# Patient Record
Sex: Female | Born: 1960
Health system: Southern US, Community
[De-identification: ages and names within clinical notes are randomized; demographics above are authoritative.]

## PROBLEM LIST (undated history)

## (undated) DIAGNOSIS — G43909 Migraine, unspecified, not intractable, without status migrainosus: Secondary | ICD-10-CM

## (undated) DIAGNOSIS — I10 Essential (primary) hypertension: Secondary | ICD-10-CM

## (undated) DIAGNOSIS — K219 Gastro-esophageal reflux disease without esophagitis: Secondary | ICD-10-CM

## (undated) DIAGNOSIS — E785 Hyperlipidemia, unspecified: Secondary | ICD-10-CM

## (undated) HISTORY — DX: Gastro-esophageal reflux disease without esophagitis: K21.9

## (undated) HISTORY — PX: TUBAL LIGATION: SHX77

## (undated) HISTORY — DX: Essential (primary) hypertension: I10

## (undated) HISTORY — DX: Migraine, unspecified, not intractable, without status migrainosus: G43.909

## (undated) HISTORY — PX: DILATION AND CURETTAGE OF UTERUS: SHX78

---

## 2004-10-15 ENCOUNTER — Ambulatory Visit: Payer: Self-pay | Admitting: General Practice

## 2005-11-19 ENCOUNTER — Ambulatory Visit: Payer: Self-pay | Admitting: General Practice

## 2006-04-05 ENCOUNTER — Other Ambulatory Visit: Payer: Self-pay

## 2006-04-08 ENCOUNTER — Ambulatory Visit: Payer: Self-pay | Admitting: Unknown Physician Specialty

## 2006-11-23 ENCOUNTER — Ambulatory Visit: Payer: Self-pay | Admitting: Endocrinology

## 2008-05-17 ENCOUNTER — Ambulatory Visit: Payer: Self-pay | Admitting: Unknown Physician Specialty

## 2009-11-04 LAB — HM MAMMOGRAPHY: HM Mammogram: NORMAL

## 2009-11-25 ENCOUNTER — Ambulatory Visit: Payer: Self-pay | Admitting: Unknown Physician Specialty

## 2010-04-06 LAB — HM MAMMOGRAPHY: HM Mammogram: NORMAL

## 2011-08-06 LAB — HM PAP SMEAR: HM Pap smear: NORMAL

## 2011-09-01 ENCOUNTER — Telehealth: Payer: Self-pay | Admitting: Internal Medicine

## 2011-09-01 NOTE — Telephone Encounter (Signed)
Called and advised patient that she needs to call [facility], or go to Urgent Care until she is established with [place].

## 2011-09-01 NOTE — Telephone Encounter (Signed)
Pt called to make new patient appointment made 11/03/11 pt stated she was dr [facility] pt he is no longer seeing pt.  She had episode yesterday  Of dizzy,clamming,could not think straight, nausea. Pt stated her neck and chest was real red yesterday after lunch.   Still dizzy this morning Had pt call back to [facility] to see if they could see her

## 2011-11-03 ENCOUNTER — Ambulatory Visit (INDEPENDENT_AMBULATORY_CARE_PROVIDER_SITE_OTHER): Payer: 59 | Admitting: Internal Medicine

## 2011-11-03 ENCOUNTER — Encounter: Payer: Self-pay | Admitting: Internal Medicine

## 2011-11-03 VITALS — BP 124/76 | HR 82 | Temp 97.9°F | Resp 14 | Ht 63.0 in | Wt 172.5 lb

## 2011-11-03 DIAGNOSIS — K219 Gastro-esophageal reflux disease without esophagitis: Secondary | ICD-10-CM

## 2011-11-03 DIAGNOSIS — E669 Obesity, unspecified: Secondary | ICD-10-CM

## 2011-11-03 DIAGNOSIS — I10 Essential (primary) hypertension: Secondary | ICD-10-CM

## 2011-11-03 DIAGNOSIS — Z1239 Encounter for other screening for malignant neoplasm of breast: Secondary | ICD-10-CM

## 2011-11-03 DIAGNOSIS — Z1211 Encounter for screening for malignant neoplasm of colon: Secondary | ICD-10-CM

## 2011-11-03 DIAGNOSIS — R079 Chest pain, unspecified: Secondary | ICD-10-CM

## 2011-11-03 DIAGNOSIS — Z124 Encounter for screening for malignant neoplasm of cervix: Secondary | ICD-10-CM

## 2011-11-03 MED ORDER — TRIAMTERENE-HCTZ 37.5-25 MG PO CAPS: 1.0000 | ORAL_CAPSULE | ORAL | Status: DC

## 2011-11-03 MED ORDER — OMEPRAZOLE 20 MG PO CPDR: 20.0000 mg | DELAYED_RELEASE_CAPSULE | Freq: Every day | ORAL | Status: DC

## 2011-11-03 MED ORDER — METOPROLOL SUCCINATE ER 50 MG PO TB24: 50.0000 mg | ORAL_TABLET | Freq: Every day | ORAL | Status: DC

## 2011-11-03 MED ORDER — SUMATRIPTAN SUCCINATE 25 MG PO TABS: 25.0000 mg | ORAL_TABLET | ORAL | Status: DC | PRN

## 2011-11-03 NOTE — Progress Notes (Signed)
Patient ID: Vanessa Richards, female   DOB: 08/25/1960, 51 y.o.   MRN: [id number]   Patient Active Problem List  Diagnosis  . GERD (gastroesophageal reflux disease)  . Hypertension  . Migraine syndrome  . Obesity (BMI 30-39.9)  . Chest pain at rest    Subjective:  CC:   Chief Complaint  Patient presents with  . New Patient    HPI:   Vanessa Richards is a 51 y.o. female who presents as a new patient to establish primary care referred by Arvilla Meres and by Dr. Wonda Cheng, her former PCP,  with the chief complaint of chest pain. She had a recent episode of chest pain in April that was evaluated with a treadmill stress test done at [facility] , ready by Gwen Pounds .  Reportedly normal, done wtithout a cardiologist evaluation pre or post procedure per patient.The chest pain occurred while she was are rest, talking on the phone and was described as chest heaviness, accompanied by disorientation, diaphoresis, and nausea.  She has episodes in the past which have been attributed to anxiety/panic disorder diagnosed after her first stress test years ago. There has been no recurrence of chest  pain during exercise.  FH of CAD: her GF died at 70.,  Father died at 17  With diffuse CAD , and DVT post op from CABG.   Continues to have episodes which she attributes to reflux since she has a history of esophageal reflux with prior stricture seen on a barium swallow. She has epidosed of esophgeal spasm caused by eating too fast.  No prior GI evaluation or EGD. She has additional chief complaints of wt gain of 15 lbs since October which she attributes to changing of her antihypertensives from a maxzide capsule, unclear dose, to tablets , at a presumedly lower dose, due to recurrent hypokalemia. , She exercises 3 times weekly with a walking program but has no dietary plan or focused restirction of calories or starches.  She has a history of diet induced hypoglycemia, caused by skipping breakfast  4) Menopause  occurred at age 7,  With hot flashes now improving tolerable without meds.    Past Medical History  Diagnosis Date  . GERD (gastroesophageal reflux disease)   . Hypertension   . Migraine syndrome     Past Surgical History  Procedure Date  . Dilation and curettage of uterus   . Tubal ligation     No family history on file.  History   Social History  . Marital Status: Married    Spouse Name: N/A    Number of Children: N/A  . Years of Education: N/A   Occupational History  . Not on file.   Social History Main Topics  . Smoking status: Never Smoker   . Smokeless tobacco: Never Used  . Alcohol Use: Yes  . Drug Use: No  . Sexually Active: Not on file   Other Topics Concern  . Not on file   Social History Narrative  . No narrative on file         No Known Allergies   Review of Systems:   The remainder of the review of systems was negative except those addressed in the HPI.    Objective:  BP 124/76  Pulse 82  Temp 97.9 F (36.6 C) (Oral)  Resp 14  Ht 5\' 3"  (1.6 m)  Wt 172 lb 8 oz (78.245 kg)  BMI 30.56 kg/m2  SpO2 96%  General appearance: alert, cooperative and appears stated  age Ears: normal TM's and external ear canals both ears Throat: lips, mucosa, and tongue normal; teeth and gums normal Neck: no adenopathy, no carotid bruit, supple, symmetrical, trachea midline and thyroid not enlarged, symmetric, no tenderness/mass/nodules Back: symmetric, no curvature. ROM normal. No CVA tenderness. Lungs: clear to auscultation bilaterally Heart: regular rate and rhythm, S1, S2 normal, no murmur, click, rub or gallop Abdomen: soft, non-tender; bowel sounds normal; no masses,  no organomegaly Pulses: 2+ and symmetric Skin: Skin color, texture, turgor normal. No rashes or lesions Lymph nodes: Cervical, supraclavicular, and axillary nodes normal.  Assessment and Plan:  Obesity (BMI 30-39.9) I have addressed  BMI and recommended a low glycemic index diet  utilizing smaller more frequent meals to increase metabolism.  I have also recommended that patient start exercising with a goal of 30 minutes of aerobic exercise a minimum of 5 days per week. Screening for lipid disorders, thyroid and diabetes to be done today.    GERD (gastroesophageal reflux disease) With history of esophageal stricture apparently seen on barium  UGI study with no EGD for follow up. I have searched [facility] for an UGI report and find no such report. Continue PPI daily and send for EGD evaluation to confirm stricture and rule out adenoCA with biopsy if found.   Chest pain at rest With prior risk stratification via treadmill stress test x 2 by [facility].   Symptoms are atypical , do not occur with exertion .  Will request prior records for evaluation . She has other diagnoses which could explain her symptoms.   Hypertension Resume prior dose of maxzide and supplement potassium if needed   Updated Medication List Outpatient Encounter Prescriptions as of 11/03/2011  Medication Sig Dispense Refill  . ALPRAZolam (XANAX) 0.5 MG tablet Take 0.5 mg by mouth at bedtime as needed.      . fexofenadine (ALLEGRA) 180 MG tablet Take 180 mg by mouth daily.      . metoprolol succinate (TOPROL-XL) 50 MG 24 hr tablet Take 1 tablet (50 mg total) by mouth daily. Take with or immediately following a meal.  30 tablet  5  . omeprazole (PRILOSEC) 20 MG capsule Take 1 capsule (20 mg total) by mouth daily.  30 capsule  5  . SUMAtriptan (IMITREX) 25 MG tablet Take 1 tablet (25 mg total) by mouth every 2 (two) hours as needed.  10 tablet  3  . DISCONTD: metoprolol succinate (TOPROL-XL) 50 MG 24 hr tablet Take 50 mg by mouth daily. Take with or immediately following a meal.      . DISCONTD: omeprazole (PRILOSEC) 20 MG capsule Take 20 mg by mouth daily.      Marland Kitchen DISCONTD: SUMAtriptan (IMITREX) 25 MG tablet Take 25 mg by mouth every 2 (two) hours as needed.      Marland Kitchen DISCONTD:  triamterene-hydrochlorothiazide (MAXZIDE-25) 37.5-25 MG per tablet Take 0.5 tablets by mouth daily.      Marland Kitchen triamterene-hydrochlorothiazide (DYAZIDE) 37.5-25 MG per capsule Take 1 each (1 capsule total) by mouth every morning.  30 capsule  3     Orders Placed This Encounter  Procedures  . MM Digital Screening    Return in about 3 months (around 02/03/2012).

## 2011-11-03 NOTE — Patient Instructions (Addendum)
Consider the Low Glycemic Index Diet and 6 smaller meals daily .  This boosts your metabolism and regulates your sugars:   7 AM Low carbohydrate Protein  Shakes (EAS Carb Control  Or Atkins ,  Available everywhere,   In  cases at BJs )  2.5 carbs  (Add or substitute a toasted sandwhich thin w/ peanut butter)  10 AM: Protein bar by Atkins (snack size,  Chocolate lover's variety at  BJ's)    Lunch: sandwich on pita bread or flatbread (Joseph's makes a pita bread and a flat bread , available at [employer] and [employer]J's; Toufayan makes a low carb flatbread available at [employer] and [employer]) Mission makes a low carb whole wheat tortilla available at [employer] most grocery stores   3 PM:  Mid day :  Another protein bar,  Or a  cheese stick, 1/4 cup of almonds, walnuts, pistachios, pecans, peanuts,  Macadamia nuts  6 PM  Dinner:  "mean and green:"  Meat/chicken/fish, salad, and green veggie : use ranch, vinagrette,  Blue cheese, etc  9 PM snack : Breyer's low carb fudgsicle or  ice cream bar (Carb Smart), or  Weight Watcher's ice cream bar , or another protein shake Dannon Light  n fit greek yogurt is 80 cal and 8 carbs     Try to keep substitutions under  20 net carbs (subtract fiber carbs form total carb to get net carbs)

## 2011-11-05 ENCOUNTER — Encounter: Payer: Self-pay | Admitting: Internal Medicine

## 2011-11-05 DIAGNOSIS — K219 Gastro-esophageal reflux disease without esophagitis: Secondary | ICD-10-CM | POA: Insufficient documentation

## 2011-11-05 DIAGNOSIS — R079 Chest pain, unspecified: Secondary | ICD-10-CM | POA: Insufficient documentation

## 2011-11-05 DIAGNOSIS — E669 Obesity, unspecified: Secondary | ICD-10-CM | POA: Insufficient documentation

## 2011-11-05 DIAGNOSIS — G43909 Migraine, unspecified, not intractable, without status migrainosus: Secondary | ICD-10-CM | POA: Insufficient documentation

## 2011-11-05 DIAGNOSIS — I10 Essential (primary) hypertension: Secondary | ICD-10-CM | POA: Insufficient documentation

## 2011-11-05 NOTE — Assessment & Plan Note (Signed)
With prior risk stratification via treadmill stress test x 2.  Symptoms are atypical , do not occur with exertion .  Will request prior records for evaluation . She has other diagnoses which could explain her symptoms.

## 2011-11-05 NOTE — Assessment & Plan Note (Signed)
I have addressed  BMI and recommended a low glycemic index diet utilizing smaller more frequent meals to increase metabolism.  I have also recommended that patient start exercising with a goal of 30 minutes of aerobic exercise a minimum of 5 days per week. Screening for lipid disorders, thyroid and diabetes to be done today.   

## 2011-11-05 NOTE — Assessment & Plan Note (Signed)
Resume prior dose of maxzide and supplement potassium if needed

## 2011-11-05 NOTE — Assessment & Plan Note (Signed)
With history of esophageal stricture apparently seen on barium  UGI study with no EGD for follow up. Continue PPI daily and send for EGD evaluation to confirm stricture and rule out adenoCA with biopsy if found.

## 2011-11-19 ENCOUNTER — Ambulatory Visit: Payer: Self-pay | Admitting: Internal Medicine

## 2011-11-19 LAB — HM MAMMOGRAPHY: HM Mammogram: NORMAL

## 2011-11-23 ENCOUNTER — Telehealth: Payer: Self-pay | Admitting: Internal Medicine

## 2011-11-23 NOTE — Telephone Encounter (Signed)
Left message notifying patient.

## 2011-11-23 NOTE — Telephone Encounter (Signed)
Her mammogram was normal.  Repeat in one year 

## 2011-11-30 ENCOUNTER — Encounter: Payer: Self-pay | Admitting: Internal Medicine

## 2012-01-08 ENCOUNTER — Ambulatory Visit (INDEPENDENT_AMBULATORY_CARE_PROVIDER_SITE_OTHER): Payer: 59 | Admitting: Internal Medicine

## 2012-01-08 ENCOUNTER — Encounter: Payer: Self-pay | Admitting: Internal Medicine

## 2012-01-08 VITALS — BP 142/88 | HR 87 | Temp 98.8°F | Ht 63.0 in | Wt 166.0 lb

## 2012-01-08 DIAGNOSIS — J40 Bronchitis, not specified as acute or chronic: Secondary | ICD-10-CM

## 2012-01-08 MED ORDER — PREDNISONE (PAK) 10 MG PO TABS: ORAL_TABLET | ORAL | Status: AC

## 2012-01-08 MED ORDER — HYDROCOD POLST-CHLORPHEN POLST 10-8 MG/5ML PO LQCR: 5.0000 mL | Freq: Two times a day (BID) | ORAL | Status: DC | PRN

## 2012-01-08 MED ORDER — AMOXICILLIN-POT CLAVULANATE 875-125 MG PO TABS: 1.0000 | ORAL_TABLET | Freq: Two times a day (BID) | ORAL | Status: AC

## 2012-01-08 NOTE — Assessment & Plan Note (Signed)
Symptoms are most consistent with bronchitis. Will treat with Augmentin and prednisone taper. We'll use Tussionex for cough. Patient will call or e-mail if symptoms are not improving over the next 72 hours. If no improvement, would favor getting chest x-ray.

## 2012-01-08 NOTE — Progress Notes (Signed)
Subjective:    Patient ID: Vanessa Richards, female    DOB: 20-Jul-1960, 51 y.o.   MRN: [id number]  HPI 51 year old female with history of hypertension presents for acute visit complaining of one-week history of sore throat, cough, and shortness of breath. She reports that her son was sick with similar symptoms. Her symptoms first began with general malaise, subjective fever and sore throat. She then developed a nonproductive cough and occasional tightness across her chest and shortness of breath. She took some over-the-counter cough and cold medication with minimal improvement. She denies any persistent fever or chills or chest pain. She is not a smoker.  Outpatient Encounter Prescriptions as of 01/08/2012  Medication Sig Dispense Refill  . ALPRAZolam (XANAX) 0.5 MG tablet Take 0.5 mg by mouth at bedtime as needed.      . fexofenadine (ALLEGRA) 180 MG tablet Take 180 mg by mouth daily.      . metoprolol succinate (TOPROL-XL) 50 MG 24 hr tablet Take 1 tablet (50 mg total) by mouth daily. Take with or immediately following a meal.  30 tablet  5  . omeprazole (PRILOSEC) 20 MG capsule Take 1 capsule (20 mg total) by mouth daily.  30 capsule  5  . SUMAtriptan (IMITREX) 25 MG tablet Take 1 tablet (25 mg total) by mouth every 2 (two) hours as needed.  10 tablet  3  . triamterene-hydrochlorothiazide (DYAZIDE) 37.5-25 MG per capsule Take 1 each (1 capsule total) by mouth every morning.  30 capsule  3  . amoxicillin-clavulanate (AUGMENTIN) 875-125 MG per tablet Take 1 tablet by mouth 2 (two) times daily.  20 tablet  0  . chlorpheniramine-HYDROcodone (TUSSIONEX) 10-8 MG/5ML LQCR Take 5 mLs by mouth every 12 (twelve) hours as needed.  140 mL  0  . predniSONE (STERAPRED UNI-PAK) 10 MG tablet Take 60mg  day 1 then taper by 10mg  daily  21 tablet  0    Review of Systems  Constitutional: Positive for fever and fatigue. Negative for chills, appetite change and unexpected weight change.  HENT: Positive for sore  throat. Negative for ear pain, congestion, trouble swallowing, neck pain, voice change and sinus pressure.   Eyes: Negative for visual disturbance.  Respiratory: Positive for cough, chest tightness and shortness of breath. Negative for wheezing and stridor.   Cardiovascular: Negative for chest pain, palpitations and leg swelling.  Gastrointestinal: Negative for nausea, vomiting, abdominal pain, diarrhea, constipation, blood in stool, abdominal distention and anal bleeding.  Genitourinary: Negative for dysuria and flank pain.  Musculoskeletal: Negative for myalgias, arthralgias and gait problem.  Skin: Negative for color change and rash.  Neurological: Negative for dizziness and headaches.  Hematological: Negative for adenopathy. Does not bruise/bleed easily.  Psychiatric/Behavioral: Negative for suicidal ideas, disturbed wake/sleep cycle and dysphoric mood. The patient is not nervous/anxious.    BP 142/88  Pulse 87  Temp 98.8 F (37.1 C) (Oral)  Ht 5\' 3"  (1.6 m)  Wt 166 lb (75.297 kg)  BMI 29.41 kg/m2  SpO2 98%     Objective:   Physical Exam  Constitutional: She is oriented to person, place, and time. She appears well-developed and well-nourished. No distress.  HENT:  Head: Normocephalic and atraumatic.  Right Ear: External ear normal.  Left Ear: External ear normal.  Nose: Nose normal.  Mouth/Throat: Oropharynx is clear and moist. No oropharyngeal exudate.  Eyes: Conjunctivae are normal. Pupils are equal, round, and reactive to light. Right eye exhibits no discharge. Left eye exhibits no discharge. No scleral icterus.  Neck: Normal  range of motion. Neck supple. No tracheal deviation present. No thyromegaly present.  Cardiovascular: Normal rate, regular rhythm, normal heart sounds and intact distal pulses.  Exam reveals no gallop and no friction rub.   No murmur heard. Pulmonary/Chest: Breath sounds normal. No accessory muscle usage. Not tachypneic. No respiratory distress. She has  no decreased breath sounds. She has no wheezes. She has no rhonchi. She has no rales. She exhibits no tenderness.  Musculoskeletal: Normal range of motion. She exhibits no edema and no tenderness.  Lymphadenopathy:    She has no cervical adenopathy.  Neurological: She is alert and oriented to person, place, and time. No cranial nerve deficit. She exhibits normal muscle tone. Coordination normal.  Skin: Skin is warm and dry. No rash noted. She is not diaphoretic. No erythema. No pallor.  Psychiatric: She has a normal mood and affect. Her behavior is normal. Judgment and thought content normal.          Assessment & Plan:

## 2012-02-03 ENCOUNTER — Encounter (INDEPENDENT_AMBULATORY_CARE_PROVIDER_SITE_OTHER): Payer: 59 | Admitting: Internal Medicine

## 2012-02-03 DIAGNOSIS — Z0289 Encounter for other administrative examinations: Secondary | ICD-10-CM

## 2012-02-05 NOTE — Progress Notes (Signed)
Patient ID: Vanessa Richards, female   DOB: [birth year], 51 y.o.   MRN: [id number] patient no showed

## 2012-04-13 ENCOUNTER — Ambulatory Visit: Payer: 59 | Admitting: Internal Medicine

## 2012-04-15 ENCOUNTER — Ambulatory Visit: Payer: 59 | Admitting: Internal Medicine

## 2012-04-18 ENCOUNTER — Ambulatory Visit (INDEPENDENT_AMBULATORY_CARE_PROVIDER_SITE_OTHER): Payer: 59 | Admitting: Internal Medicine

## 2012-04-18 ENCOUNTER — Encounter: Payer: Self-pay | Admitting: Internal Medicine

## 2012-04-18 VITALS — BP 110/62 | HR 72 | Temp 98.2°F | Resp 12 | Ht 63.0 in | Wt 162.0 lb

## 2012-04-18 DIAGNOSIS — I10 Essential (primary) hypertension: Secondary | ICD-10-CM

## 2012-04-18 DIAGNOSIS — E669 Obesity, unspecified: Secondary | ICD-10-CM

## 2012-04-18 DIAGNOSIS — R079 Chest pain, unspecified: Secondary | ICD-10-CM

## 2012-04-18 DIAGNOSIS — K219 Gastro-esophageal reflux disease without esophagitis: Secondary | ICD-10-CM

## 2012-04-18 DIAGNOSIS — Z79899 Other long term (current) drug therapy: Secondary | ICD-10-CM

## 2012-04-18 LAB — COMPREHENSIVE METABOLIC PANEL
ALT: 15 U/L (ref 0–35)
AST: 21 U/L (ref 0–37)
Albumin: 4.5 g/dL (ref 3.5–5.2)
Alkaline Phosphatase: 87 U/L (ref 39–117)
BUN: 15 mg/dL (ref 6–23)
CO2: 31 mEq/L (ref 19–32)
Calcium: 9.6 mg/dL (ref 8.4–10.5)
Chloride: 99 mEq/L (ref 96–112)
Creatinine, Ser: 0.9 mg/dL (ref 0.4–1.2)
GFR: 74.9 mL/min (ref >60.00)
Glucose, Bld: 94 mg/dL (ref 70–99)
Potassium: 3.9 mEq/L (ref 3.5–5.1)
Sodium: 138 mEq/L (ref 135–145)
Total Bilirubin: 0.8 mg/dL (ref 0.3–1.2)
Total Protein: 8.4 g/dL — ABNORMAL HIGH (ref 6.0–8.3)

## 2012-04-18 MED ORDER — ALPRAZOLAM 0.5 MG PO TABS: 0.5000 mg | ORAL_TABLET | Freq: Every evening | ORAL | Status: DC | PRN

## 2012-04-18 NOTE — Progress Notes (Signed)
Patient ID: Vanessa Richards, female   DOB: 1960-10-09, 51 y.o.   MRN: [id number]  Patient Active Problem List  Diagnosis  . GERD (gastroesophageal reflux disease)  . Hypertension  . Migraine syndrome  . Obesity (BMI 30-39.9)  . Chest pain at rest  . Bronchitis    Subjective:  CC:   Chief Complaint  Patient presents with  . Follow-up    HPI:   Vanessa Richards a 51 y.o. female who presents Follow up 6 months on diet induced hypoglycemia,  obesity, and GERD.  She is  frustrated at lack of significant weight loss.  She is not exercising and is working  an additional part time job.    Past Medical History  Diagnosis Date  . GERD (gastroesophageal reflux disease)   . Hypertension   . Migraine syndrome     Past Surgical History  Procedure Date  . Dilation and curettage of uterus   . Tubal ligation     The following portions of the patient's history were reviewed and updated as appropriate: Allergies, current medications, and problem list.    Review of Systems:    Patient denies headache, fevers, malaise, unintentional weight loss, skin rash, eye pain, sinus congestion and sinus pain, sore throat, dysphagia,  hemoptysis , cough, dyspnea, wheezing, chest pain, palpitations, orthopnea, edema, abdominal pain, nausea, melena, diarrhea, constipation, flank pain, dysuria, hematuria, urinary  Frequency, nocturia, numbness, tingling, seizures,  Focal weakness, Loss of consciousness,  Tremor, insomnia, depression, anxiety, and suicidal ideation.    History   Social History  . Marital Status: Married    Spouse Name: N/A    Number of Children: N/A  . Years of Education: N/A   Occupational History  . Not on file.   Social History Main Topics  . Smoking status: Never Smoker   . Smokeless tobacco: Never Used  . Alcohol Use: Yes  . Drug Use: No  . Sexually Active: Not on file   Other Topics Concern  . Not on file   Social History Narrative  . No narrative on file     Objective:  BP 110/62  Pulse 72  Temp 98.2 F (36.8 C) (Oral)  Resp 12  Ht 5\' 3"  (1.6 m)  Wt 162 lb (73.483 kg)  BMI 28.70 kg/m2  SpO2 98%  General appearance: alert, cooperative and appears stated age Ears: normal TM's and external ear canals both ears Throat: lips, mucosa, and tongue normal; teeth and gums normal Neck: no adenopathy, no carotid bruit, supple, symmetrical, trachea midline and thyroid not enlarged, symmetric, no tenderness/mass/nodules Back: symmetric, no curvature. ROM normal. No CVA tenderness. Lungs: clear to auscultation bilaterally Heart: regular rate and rhythm, S1, S2 normal, no murmur, click, rub or gallop Abdomen: soft, non-tender; bowel sounds normal; no masses,  no organomegaly Pulses: 2+ and symmetric Skin: Skin color, texture, turgor normal. No rashes or lesions Lymph nodes: Cervical, supraclavicular, and axillary nodes normal.  Assessment and Plan:  GERD (gastroesophageal reflux disease) Improved with current therapy.  Was referred to GI but did not keep appt for EGD   Chest pain at rest resolved with treatment of GERD  Obesity (BMI 30-39.9) Now improved,  with BMI 28 currently. Her BMI remains elevated because she is not exercised.  We discussed the nature and quality of her exercises well as of her diet. Usually what I find is that people are not exercising as vigorously as they should to achieve a sustained heart rate in the aerobic zone. I suggested that  she consider joining a gym and using a personal trainer to help guide her efforts.   I also am advising her to get back on the low GI diet using six smaller meals a day to stimulate her metabolism.    Hypertension Well controlled on current medications.  No changes today.   Updated Medication List Outpatient Encounter Prescriptions as of 04/18/2012  Medication Sig Dispense Refill  . ALPRAZolam (XANAX) 0.5 MG tablet Take 1 tablet (0.5 mg total) by mouth at bedtime as needed.  30  tablet  1  . fexofenadine (ALLEGRA) 180 MG tablet Take 180 mg by mouth daily.      . metoprolol succinate (TOPROL-XL) 50 MG 24 hr tablet Take 1 tablet (50 mg total) by mouth daily. Take with or immediately following a meal.  30 tablet  5  . omeprazole (PRILOSEC) 20 MG capsule Take 1 capsule (20 mg total) by mouth daily.  30 capsule  5  . SUMAtriptan (IMITREX) 25 MG tablet Take 1 tablet (25 mg total) by mouth every 2 (two) hours as needed.  10 tablet  3  . triamterene-hydrochlorothiazide (DYAZIDE) 37.5-25 MG per capsule Take 1 each (1 capsule total) by mouth every morning.  30 capsule  3  . [DISCONTINUED] ALPRAZolam (XANAX) 0.5 MG tablet Take 0.5 mg by mouth at bedtime as needed.      . chlorpheniramine-HYDROcodone (TUSSIONEX) 10-8 MG/5ML LQCR Take 5 mLs by mouth every 12 (twelve) hours as needed.  140 mL  0     Orders Placed This Encounter  Procedures  . [facility] MAMMOGRAPHY  . Comprehensive metabolic panel    No Follow-up on file.

## 2012-04-18 NOTE — Assessment & Plan Note (Signed)
resolved with treatment of GERD

## 2012-04-18 NOTE — Assessment & Plan Note (Signed)
Well controlled on current medications.  No changes today. 

## 2012-04-18 NOTE — Patient Instructions (Addendum)
This is  my version of a  "Low GI"  Diet:  All of the foods can be found at grocery stores and in bulk at [employer].  The Atkins protein bars and shakes are available in more varieties at [employer], [employer] and [employer].     7 AM Breakfast:  Low carbohydrate Protein  Shakes (I recommend the EAS AdvantEdge "Carb Control" shakes  Or the low carb shakes by Atkins.   Both are available everywhere:  In  cases at BJs  Or in 4 packs at grocery stores and pharmacies  2.5 carbs  (Alternative is  a toasted Arnold's Sandwhich Thin w/ peanut butter, a "Bagel Thin" with cream cheese and salmon) or  a scrambled egg burrito made with a low carb tortilla .  Avoid cereal and bananas, oatmeal too unless you are cooking the old fashioned kind that takes 30-40 minutes to prepare.  the rest is overly processed, has minimal fiber, and is loaded with carbohydrates!   10 AM: Protein bar by Atkins (the snack size, under 200 cal).  There are many varieties , available widely again or in bulk in limited varieties at BJs)  Other so called "protein bars" tend to be loaded with carbohydrates.  Remember, in food advertising, the word "energy" is synonymous for " carbohydrate."  Lunch: sandwich of turkey, (or any lunchmeat, grilled meat or canned tuna), fresh avocado, mayonnaise  and cheese on a lower carbohydrate pita bread, flatbread, or tortilla . Ok to use regular mayonnaise. The bread is the only source or carbohydrate that can be decreased (Joseph's makes a pita bread and a flat bread that are 50 cal and 4 net carbs ; Toufayan makes a low carb flatbread that's 100 cal and 9 net carbs  and  Mission makes a low carb whole wheat tortilla  That is 210 cal and 6 net carbs)  3 PM:  Mid day :  Another protein bar,  Or a  cheese stick (100 cal, 0 carbs),  Or 1 ounce of  almonds, walnuts, pistachios, pecans, peanuts,  Macadamia nuts. Or a Dannon light n Fit greek yogurt, 80 cal 8 net carbs . Avoid "granola"; the dried cranberries and  raisins are loaded with carbohydrates. Mixed nuts ok if no raisins or cranberries or dried fruit.      6 PM  Dinner:  "mean and green:"  Meat/chicken/fish or a high protein legume; , with a green salad, and a low GI  Veggie (broccoli, cauliflower, green beans, spinach, brussel sprouts. Lima beans) : Avoid "Low fat dressings, as well as [place] and [place]! They are loaded with sugar! Instead use ranch, vinagrette,  Blue cheese, etc.  There is a low carb pasta by Dreamfield's available at [employer] grocery that is acceptable and tastes great. Try Michel Angel's chicken piccata over low carb pasta. The chicken dish is 0 carbs, and can be found in frozen section at BJs and [employer]'s. Also try Aaron Sanchez's "Carnitas" (pulled pork, no sauce,  0 carbs) and his pot roast.   both are in the refrigerated section at BJs   9 PM snack : Breyer's "low carb" fudgsicle or  ice cream bar (Carb Smart line), or  Weight Watcher's ice cream bar , or another "no sugar added" ice cream;a serving of fresh berries/cherries with whipped cream (Avoid bananas, pineapple, grapes  and watermelon on a regular basis because they are high in sugar)   Remember that snack Substitutions should be less than 15 to   20 carbs  Per serving. Remember to subtract fiber grams and sugar alcohols to get the "net carbs."   

## 2012-04-18 NOTE — Assessment & Plan Note (Signed)
Improved with current therapy.  Was referred to GI but did not keep appt for EGD

## 2012-04-18 NOTE — Assessment & Plan Note (Signed)
Now improved,  with BMI 28 currently. Her BMI remains elevated because she is not exercised.  We discussed the nature and quality of her exercises well as of her diet. Usually what I find is that people are not exercising as vigorously as they should to achieve a sustained heart rate in the aerobic zone. I suggested that she consider joining a gym and using a personal trainer to help guide her efforts.   I also am advising her to get back on the low GI diet using six smaller meals a day to stimulate her metabolism.

## 2012-05-17 ENCOUNTER — Other Ambulatory Visit: Payer: Self-pay | Admitting: Internal Medicine

## 2012-05-17 MED ORDER — TRIAMTERENE-HCTZ 37.5-25 MG PO CAPS: 1.0000 | ORAL_CAPSULE | ORAL | Status: DC

## 2012-05-17 NOTE — Telephone Encounter (Signed)
triamterene-hydrochlorothiazide (DYAZIDE) 37.5-25 MG per capsule  #30

## 2012-05-17 NOTE — Telephone Encounter (Signed)
Med filled.  

## 2012-05-18 ENCOUNTER — Other Ambulatory Visit: Payer: Self-pay | Admitting: General Practice

## 2012-05-18 MED ORDER — TRIAMTERENE-HCTZ 37.5-25 MG PO CAPS: 1.0000 | ORAL_CAPSULE | ORAL | Status: DC

## 2012-05-18 MED ORDER — METOPROLOL SUCCINATE ER 50 MG PO TB24: 50.0000 mg | ORAL_TABLET | Freq: Every day | ORAL | Status: DC

## 2012-05-18 NOTE — Telephone Encounter (Signed)
Med filled per pt. Sent to wrong pharmacy.

## 2012-10-05 LAB — HM PAP SMEAR: HM Pap smear: NORMAL

## 2012-10-18 ENCOUNTER — Encounter: Payer: Self-pay | Admitting: Internal Medicine

## 2012-10-18 ENCOUNTER — Other Ambulatory Visit (HOSPITAL_COMMUNITY)
Admission: RE | Admit: 2012-10-18 | Discharge: 2012-10-18 | Disposition: A | Discharge status: Home / Self Care | Payer: BC Managed Care – PPO | Source: Ambulatory Visit | Attending: Internal Medicine | Admitting: Internal Medicine

## 2012-10-18 ENCOUNTER — Ambulatory Visit (INDEPENDENT_AMBULATORY_CARE_PROVIDER_SITE_OTHER): Payer: BC Managed Care – PPO | Admitting: Internal Medicine

## 2012-10-18 VITALS — BP 118/72 | HR 80 | Temp 98.2°F | Resp 16 | Ht 63.0 in | Wt 171.8 lb

## 2012-10-18 DIAGNOSIS — Z124 Encounter for screening for malignant neoplasm of cervix: Secondary | ICD-10-CM

## 2012-10-18 DIAGNOSIS — Z79899 Other long term (current) drug therapy: Secondary | ICD-10-CM

## 2012-10-18 DIAGNOSIS — Z1211 Encounter for screening for malignant neoplasm of colon: Secondary | ICD-10-CM

## 2012-10-18 DIAGNOSIS — Z1239 Encounter for other screening for malignant neoplasm of breast: Secondary | ICD-10-CM

## 2012-10-18 DIAGNOSIS — Z01419 Encounter for gynecological examination (general) (routine) without abnormal findings: Secondary | ICD-10-CM | POA: Insufficient documentation

## 2012-10-18 DIAGNOSIS — Z1151 Encounter for screening for human papillomavirus (HPV): Secondary | ICD-10-CM | POA: Insufficient documentation

## 2012-10-18 DIAGNOSIS — N181 Chronic kidney disease, stage 1: Secondary | ICD-10-CM

## 2012-10-18 DIAGNOSIS — J309 Allergic rhinitis, unspecified: Secondary | ICD-10-CM

## 2012-10-18 DIAGNOSIS — N183 Chronic kidney disease, stage 3 unspecified: Secondary | ICD-10-CM

## 2012-10-18 DIAGNOSIS — Z0001 Encounter for general adult medical examination with abnormal findings: Secondary | ICD-10-CM | POA: Insufficient documentation

## 2012-10-18 DIAGNOSIS — Z0000 Encounter for general adult medical examination without abnormal findings: Secondary | ICD-10-CM

## 2012-10-18 MED ORDER — FLUTICASONE PROPIONATE HFA 44 MCG/ACT IN AERO: 2.0000 | INHALATION_SPRAY | Freq: Two times a day (BID) | RESPIRATORY_TRACT | Status: DC

## 2012-10-18 NOTE — Progress Notes (Signed)
Patient ID: Vanessa Richards, female   DOB: February 24, 1961, 52 y.o.   MRN: [id number]   Subjective:     Vanessa Richards is a 52 y.o. female and is here for a comprehensive physical exam. The patient reports no problems.  History   Social History  . Marital Status: Married    Spouse Name: N/A    Number of Children: N/A  . Years of Education: N/A   Occupational History  . Not on file.   Social History Main Topics  . Smoking status: Never Smoker   . Smokeless tobacco: Never Used  . Alcohol Use: Yes  . Drug Use: No  . Sexually Active: Not on file   Other Topics Concern  . Not on file   Social History Narrative  . No narrative on file   Health Maintenance  Topic Date Due  . Colonoscopy  02/21/2011  . Influenza Vaccine  01/02/2013  . Mammogram  11/18/2013  . Pap Smear  10/19/2015  . Tetanus/tdap  04/18/2016    The following portions of the patient's history were reviewed and updated as appropriate: allergies, current medications, past family history, past medical history, past social history, past surgical history and problem list.  Review of Systems A comprehensive review of systems was negative.   Objective:   BP 118/72  Pulse 80  Temp(Src) 98.2 F (36.8 C) (Oral)  Resp 16  Ht 5\' 3"  (1.6 m)  Wt 171 lb 12 oz (77.905 kg)  BMI 30.43 kg/m2  SpO2 98%  General Appearance:    Alert, cooperative, no distress, appears stated age  Head:    Normocephalic, without obvious abnormality, atraumatic  Eyes:    PERRL, conjunctiva/corneas clear, EOM's intact, fundi    benign, both eyes  Ears:    Normal TM's and external ear canals, both ears  Nose:   Nares normal, septum midline, mucosa normal, no drainage    or sinus tenderness  Throat:   Lips, mucosa, and tongue normal; teeth and gums normal  Neck:   Supple, symmetrical, trachea midline, no adenopathy;    thyroid:  no enlargement/tenderness/nodules; no carotid   bruit or JVD  Back:     Symmetric, no curvature, ROM normal, no  CVA tenderness  Lungs:     Clear to auscultation bilaterally, respirations unlabored  Chest Wall:    No tenderness or deformity   Heart:    Regular rate and rhythm, S1 and S2 normal, no murmur, rub   or gallop  Breast Exam:    No tenderness, masses, or nipple abnormality  Abdomen:     Soft, non-tender, bowel sounds active all four quadrants,    no masses, no organomegaly  Genitalia:    Pelvic: cervix normal in appearance, external genitalia normal, no adnexal masses or tenderness, no cervical motion tenderness, rectovaginal septum normal, uterus normal size, shape, and consistency and vagina normal without discharge  Extremities:   Extremities normal, atraumatic, no cyanosis or edema  Pulses:   2+ and symmetric all extremities  Skin:   Skin color, texture, turgor normal, no rashes or lesions  Lymph nodes:   Cervical, supraclavicular, and axillary nodes normal  Neurologic:   CNII-XII intact, normal strength, sensation and reflexes    throughout   Assessment and Plan  Routine general medical examination at a health care facility Annual comprehensive exam was done including breast, pelvic and PAP smear. All screenings have been addressed .  She has just changed insurance companies so colon cancer screening modalities were discussed and  she chose to do annual FOBTs until she hs  Met  her deductible   Updated Medication List Outpatient Encounter Prescriptions as of 10/18/2012  Medication Sig Dispense Refill  . ALPRAZolam (XANAX) 0.5 MG tablet Take 1 tablet (0.5 mg total) by mouth at bedtime as needed.  30 tablet  1  . fexofenadine (ALLEGRA) 180 MG tablet Take 180 mg by mouth daily.      . metoprolol succinate (TOPROL-XL) 50 MG 24 hr tablet Take 1 tablet (50 mg total) by mouth daily. Take with or immediately following a meal.  30 tablet  5  . omeprazole (PRILOSEC) 20 MG capsule Take 1 capsule (20 mg total) by mouth daily.  30 capsule  5  . SUMAtriptan (IMITREX) 25 MG tablet Take 1 tablet (25 mg  total) by mouth every 2 (two) hours as needed.  10 tablet  3  . triamterene-hydrochlorothiazide (DYAZIDE) 37.5-25 MG per capsule Take 1 each (1 capsule total) by mouth every morning.  30 capsule  3  . chlorpheniramine-HYDROcodone (TUSSIONEX) 10-8 MG/5ML LQCR Take 5 mLs by mouth every 12 (twelve) hours as needed.  140 mL  0  . fluticasone (FLOVENT HFA) 44 MCG/ACT inhaler Inhale 2 puffs into the lungs 2 (two) times daily.  1 Inhaler  2   No facility-administered encounter medications on file as of 10/18/2012.

## 2012-10-18 NOTE — Patient Instructions (Addendum)
You had your annual exam today with PAP smear.  Mammogram is due after July 18th  Labs done today,  Return thr fecal occult blood test for your colon cancer screening test (done annually in place of colonoscopy)  This is Dr. Melina Schools personal version of a  "Low GI"  Diet:  It will allow you to lose 4 to 8  lbs  per month if you follow it carefully.  Your goal with exercise is a minimum of 30 minutes of aerobic exercise 5 days per week (Walking does not count once it becomes easy!)    All of the foods can be found at grocery stores and in bulk at [employer].  The Atkins protein bars and shakes are available in more varieties at Target, WalMart and [employer].     7 AM Breakfast:  Choose from the following:  Low carbohydrate Protein  Shakes (I recommend the [employer]AS AdvantEdge "Carb Control" shakes  Or the low carb shakes by Atkins.    2.5 carbs   Arnold's "Sandwhich Thin"toasted  w/ peanut butter (no jelly: about 20 net carbs  "Bagel Thin" with cream cheese and salmon: about 20 carbs   a scrambled egg/bacon/cheese burrito made with Mission's "carb balance" whole wheat tortilla  (about 10 net carbs )   Avoid cereal and bananas, oatmeal and cream of wheat and grits. They are loaded with carbohydrates!   10 AM: high protein snack  Protein bar by Atkins (the snack size, under 200 cal, usually < 6 net carbs).    A stick of cheese:  Around 1 carb,  100 cal     Dannon Light n Fit [place] Yogurt  (80 cal, 8 carbs)  Other so called "protein bars" and Greek yogurts tend to be loaded with carbohydrates.  Remember, in food advertising, the word "energy" is synonymous for " carbohydrate."  Lunch:   A Sandwich using the bread choices listed, Can use any  Eggs,  lunchmeat, grilled meat or canned tuna), avocado, regular mayo/mustard  and cheese.  A Salad using blue cheese, ranch,  Goddess or vinagrette,  No croutons or "confetti" and no "candied nuts" but regular nuts OK.   No pretzels or chips.  Pickles and  miniature sweet peppers are a good low carb alternative that provide a "crunch"  The bread is the only source of carbohydrate in a sandwich and  can be decreased by trying some of these alternatives to traditional loaf bread  Joseph's makes a pita bread and a flat bread that are 50 cal and 4 net carbs available at BJs and WalMart.  This can be toasted to use with hummous as well  Toufayan makes a low carb flatbread that's 100 cal and 9 net carbs available at [employer] and [employer] makes 2 sizes of  Low carb whole wheat tortilla  (The large one is 210 cal and 6 net carbs) Avoid "Low fat dressings, as well as Reyne Dumas and [address] dressings They are loaded with sugar!   3 PM/ Mid day  Snack:  Consider  1 ounce of  almonds, walnuts, pistachios, pecans, peanuts,  Macadamia nuts or a nut medley.  Avoid "granola"; the dried cranberries and raisins are loaded with carbohydrates. Mixed nuts as long as there are no raisins,  cranberries or dried fruit.     6 PM  Dinner:     Meat/fowl/fish with a green salad, and either broccoli, cauliflower, green beans, spinach, brussel sprouts or  Lima beans.  DO NOT BREAD THE PROTEIN!!      There is a low carb pasta by Dreamfield's that is acceptable and tastes great: only 5 digestible carbs/serving.( All grocery stores but BJs carry it )  Try Kai Levins Angelo's chicken piccata or chicken or eggplant parm over low carb pasta.(Lowes and BJs)   Clifton Custard Sanchez's "Carnitas" (pulled pork, no sauce,  0 carbs) or his beef pot roast to make a dinner burrito (at BJ's)  Pesto over low carb pasta (bj's sells a good quality pesto in the center refrigerated section of the deli   Whole wheat pasta is still full of digestible carbs and  Not as low in glycemic index as Dreamfield's.   Brown rice is still rice,  So skip the rice and noodles if you eat [place] or [place] (or at least limit to 1/2 cup)  9 PM snack :   Breyer's "low carb" fudgsicle or  ice cream bar (Carb Smart  line), or  Weight Watcher's ice cream bar , or another "no sugar added" ice cream;  a serving of fresh berries/cherries with whipped cream   Cheese or DANNON'S LlG[employer] [place] YOGURT  Avoid bananas, pineapple, grapes  and watermelon on a regular basis because they are high in sugar.  THINK OF THEM AS DESSERT  Remember that snack Substitutions should be less than 10 NET carbs per serving and meals < 20 carbs. Remember to subtract fiber grams to get the "net carbs."

## 2012-10-19 LAB — COMPREHENSIVE METABOLIC PANEL
ALT: 17 U/L (ref 0–35)
AST: 22 U/L (ref 0–37)
Albumin: 4 g/dL (ref 3.5–5.2)
Alkaline Phosphatase: 87 U/L (ref 39–117)
BUN: 16 mg/dL (ref 6–23)
CO2: 22 mEq/L (ref 19–32)
Calcium: 8.8 mg/dL (ref 8.4–10.5)
Chloride: 102 mEq/L (ref 96–112)
Creatinine, Ser: 1.2 mg/dL (ref 0.4–1.2)
GFR: 52.74 mL/min — ABNORMAL LOW (ref >60.00)
Glucose, Bld: 89 mg/dL (ref 70–99)
Potassium: 3.6 mEq/L (ref 3.5–5.1)
Sodium: 142 mEq/L (ref 135–145)
Total Bilirubin: 0.2 mg/dL — ABNORMAL LOW (ref 0.3–1.2)
Total Protein: 7.6 g/dL (ref 6.0–8.3)

## 2012-10-19 NOTE — Assessment & Plan Note (Signed)
Annual comprehensive exam was done including breast, pelvic and PAP smear. All screenings have been addressed .  She has just changed insurance companies so colon cancer screening modalities were discussed and she chose to do annual FOBTs until she hs  Met  her deductible

## 2012-10-20 NOTE — Addendum Note (Signed)
Addended by: Sherlene Shams on: 10/20/2012 01:06 PM   Modules accepted: Orders

## 2012-10-27 ENCOUNTER — Other Ambulatory Visit: Payer: Self-pay | Admitting: *Deleted

## 2012-10-27 ENCOUNTER — Other Ambulatory Visit (INDEPENDENT_AMBULATORY_CARE_PROVIDER_SITE_OTHER): Payer: BC Managed Care – PPO

## 2012-10-27 DIAGNOSIS — Z1211 Encounter for screening for malignant neoplasm of colon: Secondary | ICD-10-CM

## 2012-10-27 DIAGNOSIS — N183 Chronic kidney disease, stage 3 unspecified: Secondary | ICD-10-CM

## 2012-10-27 LAB — BASIC METABOLIC PANEL
BUN: 13 mg/dL (ref 6–23)
CO2: 28 mEq/L (ref 19–32)
Calcium: 9.3 mg/dL (ref 8.4–10.5)
Chloride: 101 mEq/L (ref 96–112)
Creatinine, Ser: 0.8 mg/dL (ref 0.4–1.2)
GFR: 75.77 mL/min (ref >60.00)
Glucose, Bld: 84 mg/dL (ref 70–99)
Potassium: 3.9 mEq/L (ref 3.5–5.1)
Sodium: 136 mEq/L (ref 135–145)

## 2012-10-27 LAB — FECAL OCCULT BLOOD, IMMUNOCHEMICAL: Fecal Occult Bld: NEGATIVE

## 2012-10-28 ENCOUNTER — Encounter: Payer: Self-pay | Admitting: *Deleted

## 2012-11-11 ENCOUNTER — Other Ambulatory Visit: Payer: Self-pay | Admitting: *Deleted

## 2012-11-11 MED ORDER — TRIAMTERENE-HCTZ 37.5-25 MG PO CAPS: 1.0000 | ORAL_CAPSULE | ORAL | Status: DC

## 2012-12-01 ENCOUNTER — Telehealth: Payer: Self-pay | Admitting: Internal Medicine

## 2012-12-01 NOTE — Telephone Encounter (Signed)
ALPRAZolam (XANAX) 0.5 MG tablet  °

## 2012-12-02 MED ORDER — ALPRAZOLAM 0.5 MG PO TABS: 0.5000 mg | ORAL_TABLET | Freq: Every evening | ORAL | Status: DC | PRN

## 2012-12-02 NOTE — Telephone Encounter (Signed)
Script faxed to pharmacy

## 2012-12-02 NOTE — Telephone Encounter (Signed)
Printed RX for your SIg.

## 2013-04-10 ENCOUNTER — Other Ambulatory Visit: Payer: Self-pay | Admitting: *Deleted

## 2013-04-10 MED ORDER — METOPROLOL SUCCINATE ER 50 MG PO TB24: 50.0000 mg | ORAL_TABLET | Freq: Every day | ORAL | Status: DC

## 2013-04-11 ENCOUNTER — Telehealth: Payer: Self-pay | Admitting: Internal Medicine

## 2013-04-11 NOTE — Telephone Encounter (Signed)
Refill sent 04/10/13

## 2013-04-11 NOTE — Telephone Encounter (Signed)
Pt left vm.  Needs metoprolol refill.  [facility] [phone].

## 2013-05-15 ENCOUNTER — Ambulatory Visit (INDEPENDENT_AMBULATORY_CARE_PROVIDER_SITE_OTHER): Payer: BC Managed Care – PPO | Admitting: Adult Health

## 2013-05-15 ENCOUNTER — Encounter: Payer: Self-pay | Admitting: Adult Health

## 2013-05-15 VITALS — BP 112/76 | HR 84 | Temp 98.1°F | Resp 12 | Wt 173.0 lb

## 2013-05-15 DIAGNOSIS — B9789 Other viral agents as the cause of diseases classified elsewhere: Secondary | ICD-10-CM

## 2013-05-15 DIAGNOSIS — B349 Viral infection, unspecified: Secondary | ICD-10-CM | POA: Insufficient documentation

## 2013-05-15 MED ORDER — FLUTICASONE PROPIONATE 50 MCG/ACT NA SUSP: 2.0000 | Freq: Every day | NASAL | Status: DC

## 2013-05-15 NOTE — Progress Notes (Signed)
   Subjective:    Patient ID: Clinton QuantCynthia Vanderwall, female    DOB: November 23, 1960, 53 y.o.   MRN: [id number]  HPI  Pt is a 53 y/o female who presents to clinic with nasal congestion and fever yesterday evening. No fever at present. She took NyQuil last night and reports sleeping until 11 AM this morning - "I never sleep that much". She has been around some people who have had URI and is concerned that she may be getting it.  Current Outpatient Prescriptions on File Prior to Visit  Medication Sig Dispense Refill  . ALPRAZolam (XANAX) 0.5 MG tablet Take 1 tablet (0.5 mg total) by mouth at bedtime as needed.  30 tablet  1  . fexofenadine (ALLEGRA) 180 MG tablet Take 180 mg by mouth daily as needed.       . metoprolol succinate (TOPROL-XL) 50 MG 24 hr tablet Take 1 tablet (50 mg total) by mouth daily. Take with or immediately following a meal.  30 tablet  5  . omeprazole (PRILOSEC) 20 MG capsule Take 1 capsule (20 mg total) by mouth daily.  30 capsule  5  . SUMAtriptan (IMITREX) 25 MG tablet Take 1 tablet (25 mg total) by mouth every 2 (two) hours as needed.  10 tablet  3  . triamterene-hydrochlorothiazide (DYAZIDE) 37.5-25 MG per capsule Take 1 each (1 capsule total) by mouth every morning.  30 capsule  5   No current facility-administered medications on file prior to visit.    Review of Systems  All other systems reviewed and are negative.   Positive for nasal congestion (none at present), fever (pt reports that she did not take her temp) Negative for rhinorrhea, cough, sore throat, chest congestion, sob    Objective:   Physical Exam  Constitutional: She is oriented to person, place, and time. She appears well-developed and well-nourished. No distress.  HENT:  Head: Normocephalic and atraumatic.  Right Ear: External ear normal.  Left Ear: External ear normal.  Nose: Nose normal.  Mouth/Throat: Oropharynx is clear and moist.  Cardiovascular: Normal rate, regular rhythm and normal heart sounds.   Exam reveals no gallop.   No murmur heard. Pulmonary/Chest: Effort normal and breath sounds normal. No respiratory distress. She has no wheezes. She has no rales.  Musculoskeletal: Normal range of motion.  Lymphadenopathy:    She has no cervical adenopathy.  Neurological: She is alert and oriented to person, place, and time.  Psychiatric: She has a normal mood and affect. Her behavior is normal.          Assessment & Plan:

## 2013-05-15 NOTE — Patient Instructions (Signed)
  Start Flonase 2 sprays into each nostril daily.  Drink plenty of fluid.  You may take Tylenol for general discomfort or fever.  NyQuil has alcohol as an ingredient which is why you slept late this morning.  You can take an over the counter cough medication that contains guaifenesin if you develop a cough.  Please call if your symptoms do not improve.

## 2013-05-15 NOTE — Progress Notes (Signed)
Pre visit review using our clinic review tool, if applicable. No additional management support is needed unless otherwise documented below in the visit note. 

## 2013-05-15 NOTE — Assessment & Plan Note (Signed)
Pt presents with several vague symptoms - none present. Supportive care. See instructions for specifics. RTC if symptoms do not improve.

## 2013-05-16 ENCOUNTER — Telehealth: Payer: Self-pay | Admitting: Adult Health

## 2013-05-16 NOTE — Telephone Encounter (Signed)
The patient was seen in the office on yesterday and she has a sore throat today . She was told years ago by a physician that because she has had rheumatic fever when she gets a sore throat she is to call the doctor's office.She will need an antibiotic penicillin or something in that category.

## 2013-05-17 ENCOUNTER — Encounter: Payer: Self-pay | Admitting: Adult Health

## 2013-05-17 ENCOUNTER — Ambulatory Visit (INDEPENDENT_AMBULATORY_CARE_PROVIDER_SITE_OTHER): Payer: BC Managed Care – PPO | Admitting: Adult Health

## 2013-05-17 VITALS — BP 116/78 | HR 97 | Temp 98.4°F | Resp 12 | Wt 173.0 lb

## 2013-05-17 DIAGNOSIS — J029 Acute pharyngitis, unspecified: Secondary | ICD-10-CM

## 2013-05-17 LAB — POCT RAPID STREP A (OFFICE): Rapid Strep A Screen: NEGATIVE

## 2013-05-17 NOTE — Assessment & Plan Note (Signed)
Negative for strep. No joint pain, no rash. Spent some time in education regarding use of antibiotics with a sore throat. Her symptoms are more than likely viral related. She is to continue the Flonase and Tylenol for any general discomfort. Also recommended that she take an over-the-counter cough medication. If symptoms worsen or if she develops a fever of 101 or greater she was instructed to call the office. Patient was in agreement

## 2013-05-17 NOTE — Telephone Encounter (Signed)
She needs to come in for throat swab - strep test

## 2013-05-17 NOTE — Progress Notes (Signed)
   Subjective:    Patient ID: Vanessa Richards, female    DOB: [birth year], 53 y.o.   MRN: [id number]  HPI Patient was seen in clinic on Monday for what appeared to be a viral syndrome. She was given medication for symptom management. She denied any fever at the time. She called the office yesterday stating that she now developed a sore throat. Patient has a history of rheumatic fever and she was concerned about developing this new sore throat. She was asked to come in so that we could swap her for strep.  Current Outpatient Prescriptions on File Prior to Visit  Medication Sig Dispense Refill  . ALPRAZolam (XANAX) 0.5 MG tablet Take 1 tablet (0.5 mg total) by mouth at bedtime as needed.  30 tablet  1  . fexofenadine (ALLEGRA) 180 MG tablet Take 180 mg by mouth daily as needed.       . fluticasone (FLONASE) 50 MCG/ACT nasal spray Place 2 sprays into both nostrils daily.  16 g  6  . meloxicam (MOBIC) 15 MG tablet Take 15 mg by mouth daily.      . metoprolol succinate (TOPROL-XL) 50 MG 24 hr tablet Take 1 tablet (50 mg total) by mouth daily. Take with or immediately following a meal.  30 tablet  5  . omeprazole (PRILOSEC) 20 MG capsule Take 1 capsule (20 mg total) by mouth daily.  30 capsule  5  . SUMAtriptan (IMITREX) 25 MG tablet Take 1 tablet (25 mg total) by mouth every 2 (two) hours as needed.  10 tablet  3  . triamterene-hydrochlorothiazide (DYAZIDE) 37.5-25 MG per capsule Take 1 each (1 capsule total) by mouth every morning.  30 capsule  5   No current facility-administered medications on file prior to visit.    Review of Systems  Constitutional: Negative for fever and chills.  HENT: Positive for postnasal drip and rhinorrhea. Negative for congestion and sinus pressure.   Respiratory: Positive for cough. Negative for chest tightness, shortness of breath and wheezing.   Cardiovascular: Negative.   Skin: Negative for rash.       Objective:   Physical Exam  Constitutional: She is  oriented to person, place, and time. She appears well-developed and well-nourished. No distress.  HENT:  Mouth/Throat: Oropharynx is clear and moist. No oropharyngeal exudate.  Cardiovascular: Normal rate, regular rhythm and normal heart sounds.  Exam reveals no gallop and no friction rub.   No murmur heard. Pulmonary/Chest: Effort normal and breath sounds normal. No respiratory distress. She has no wheezes. She has no rales.  Lymphadenopathy:    She has no cervical adenopathy.  Neurological: She is alert and oriented to person, place, and time.  Skin: Skin is warm and dry. No rash noted.  Psychiatric: She has a normal mood and affect. Her behavior is normal. Judgment and thought content normal.          Assessment & Plan:

## 2013-05-17 NOTE — Patient Instructions (Signed)
  Continue with Flonase nasal spray and with Tylenol for any discomfort.  Try an over-the-counter cough medication such as Robitussin or Delsym.  Call if your symptoms worsen or if you develop a fever of 101 or greater.

## 2013-05-17 NOTE — Telephone Encounter (Signed)
Appt scheduled for today.

## 2013-05-17 NOTE — Progress Notes (Signed)
Pre visit review using our clinic review tool, if applicable. No additional management support is needed unless otherwise documented below in the visit note. 

## 2013-07-20 ENCOUNTER — Telehealth: Payer: Self-pay | Admitting: Internal Medicine

## 2013-07-20 ENCOUNTER — Other Ambulatory Visit: Payer: Self-pay | Admitting: *Deleted

## 2013-07-20 DIAGNOSIS — Z79899 Other long term (current) drug therapy: Secondary | ICD-10-CM

## 2013-07-20 MED ORDER — TRIAMTERENE-HCTZ 37.5-25 MG PO CAPS: 1.0000 | ORAL_CAPSULE | ORAL | Status: DC

## 2013-07-20 NOTE — Telephone Encounter (Signed)
Since she has had several acute visits and BP was fine,  Needs only labs.  F/u in 6 months

## 2013-07-20 NOTE — Telephone Encounter (Signed)
Does she need repeat labs only or office visit as well? Last labs and non acute visit 6/14

## 2013-07-21 ENCOUNTER — Other Ambulatory Visit: Payer: Self-pay | Admitting: *Deleted

## 2013-07-21 MED ORDER — ALPRAZOLAM 0.5 MG PO TABS: 0.5000 mg | ORAL_TABLET | Freq: Every evening | ORAL | Status: DC | PRN

## 2013-07-21 NOTE — Telephone Encounter (Signed)
Ok refill? 

## 2013-07-21 NOTE — Telephone Encounter (Signed)
Medication faxed

## 2013-07-21 NOTE — Telephone Encounter (Signed)
Spoke with pt, labs scheduled 07/28/13

## 2013-07-21 NOTE — Telephone Encounter (Signed)
Ok to refill,  printed rx  

## 2013-07-28 ENCOUNTER — Other Ambulatory Visit: Payer: BC Managed Care – PPO

## 2013-07-31 ENCOUNTER — Encounter: Payer: Self-pay | Admitting: *Deleted

## 2013-07-31 ENCOUNTER — Other Ambulatory Visit (INDEPENDENT_AMBULATORY_CARE_PROVIDER_SITE_OTHER): Payer: BC Managed Care – PPO

## 2013-07-31 DIAGNOSIS — Z79899 Other long term (current) drug therapy: Secondary | ICD-10-CM

## 2013-07-31 LAB — COMPREHENSIVE METABOLIC PANEL
ALT: 14 U/L (ref 0–35)
AST: 18 U/L (ref 0–37)
Albumin: 4.1 g/dL (ref 3.5–5.2)
Alkaline Phosphatase: 80 U/L (ref 39–117)
BUN: 16 mg/dL (ref 6–23)
CO2: 31 mEq/L (ref 19–32)
Calcium: 9.5 mg/dL (ref 8.4–10.5)
Chloride: 100 mEq/L (ref 96–112)
Creatinine, Ser: 0.9 mg/dL (ref 0.4–1.2)
GFR: 73.52 mL/min (ref >60.00)
Glucose, Bld: 90 mg/dL (ref 70–99)
Potassium: 4.2 mEq/L (ref 3.5–5.1)
Sodium: 137 mEq/L (ref 135–145)
Total Bilirubin: 0.3 mg/dL (ref 0.3–1.2)
Total Protein: 7.7 g/dL (ref 6.0–8.3)

## 2013-08-29 ENCOUNTER — Other Ambulatory Visit: Payer: Self-pay | Admitting: *Deleted

## 2013-08-29 MED ORDER — OMEPRAZOLE 20 MG PO CPDR: 20.0000 mg | DELAYED_RELEASE_CAPSULE | Freq: Every day | ORAL | Status: DC

## 2013-08-29 MED ORDER — TRIAMTERENE-HCTZ 37.5-25 MG PO CAPS: 1.0000 | ORAL_CAPSULE | ORAL | Status: DC

## 2013-11-01 ENCOUNTER — Encounter: Payer: Self-pay | Admitting: Internal Medicine

## 2013-11-01 ENCOUNTER — Ambulatory Visit (INDEPENDENT_AMBULATORY_CARE_PROVIDER_SITE_OTHER): Payer: BC Managed Care – PPO | Admitting: Internal Medicine

## 2013-11-01 VITALS — BP 130/78 | HR 84 | Temp 98.7°F | Resp 16 | Ht 63.0 in | Wt 171.0 lb

## 2013-11-01 DIAGNOSIS — Z1239 Encounter for other screening for malignant neoplasm of breast: Secondary | ICD-10-CM

## 2013-11-01 DIAGNOSIS — Z0000 Encounter for general adult medical examination without abnormal findings: Secondary | ICD-10-CM

## 2013-11-01 DIAGNOSIS — Z23 Encounter for immunization: Secondary | ICD-10-CM

## 2013-11-01 DIAGNOSIS — E785 Hyperlipidemia, unspecified: Secondary | ICD-10-CM

## 2013-11-01 MED ORDER — METOPROLOL SUCCINATE ER 50 MG PO TB24
50.0000 mg | ORAL_TABLET | Freq: Every day | ORAL | Status: DC
Start: 2013-11-01 — End: 2014-06-16

## 2013-11-01 MED ORDER — TETANUS-DIPHTH-ACELL PERTUSSIS 5-2.5-18.5 LF-MCG/0.5 IM SUSP: 0.5000 mL | Freq: Once | INTRAMUSCULAR | Status: DC

## 2013-11-01 MED ORDER — ALPRAZOLAM 0.5 MG PO TABS: 0.5000 mg | ORAL_TABLET | Freq: Every evening | ORAL | Status: DC | PRN

## 2013-11-01 NOTE — Patient Instructions (Signed)
You had your annual  wellness exam today.  We will repeat your PAP smear in 2017  We will schedule your mammogram soon!!!   You received the TDaP vaccine today.  If you develop a sore arm , use tylenol and ibuprofen for a few days   Please make an appt for fasting labs at your earliest convenience.   We will contact you with the bloodwork results  Please check to see if your inuraqnce will cover a screening colonoscopy, given your FH of colon CA

## 2013-11-01 NOTE — Progress Notes (Signed)
Pre visit review using our clinic review tool, if applicable. No additional management support is needed unless otherwise documented below in the visit note. 

## 2013-11-04 NOTE — Progress Notes (Signed)
Patient ID: Vanessa Richards, female   DOB: 08/23/1960, 53 y.o.   MRN: [id number]  Subjective:    Vanessa Richards is a 53 y.o. female who presents for an annual exam. The patient has no complaints today. The patient is sexually active. GYN screening history: last pap: was normal. The patient wears seatbelts: yes. The patient participates in regular exercise: no. Has the patient ever been transfused or tattooed?: no. The patient reports that there is not domestic violence in her life.   Menstrual History: OB History   Grav Para Term Preterm Abortions TAB SAB Ect Mult Living                  Menarche age: 89  No LMP recorded. Patient is postmenopausal.    The following portions of the patient's history were reviewed and updated as appropriate: allergies, current medications, past family history, past medical history, past social history, past surgical history and problem list.  Review of Systems A comprehensive review of systems was negative.    Objective:  BP 130/78  Pulse 84  Temp(Src) 98.7 F (37.1 C) (Oral)  Resp 16  Ht 5\' 3"  (1.6 m)  Wt 171 lb (77.565 kg)  BMI 30.30 kg/m2  SpO2 98%  General Appearance:    Alert, cooperative, no distress, appears stated age  Head:    Normocephalic, without obvious abnormality, atraumatic  Eyes:    PERRL, conjunctiva/corneas clear, EOM's intact, fundi    benign, both eyes  Ears:    Normal TM's and external ear canals, both ears  Nose:   Nares normal, septum midline, mucosa normal, no drainage    or sinus tenderness  Throat:   Lips, mucosa, and tongue normal; teeth and gums normal  Neck:   Supple, symmetrical, trachea midline, no adenopathy;    thyroid:  no enlargement/tenderness/nodules; no carotid   bruit or JVD  Back:     Symmetric, no curvature, ROM normal, no CVA tenderness  Lungs:     Clear to auscultation bilaterally, respirations unlabored  Chest Wall:    No tenderness or deformity   Heart:    Regular rate and rhythm, S1 and S2  normal, no murmur, rub   or gallop  Breast Exam:    No tenderness, masses, or nipple abnormality  Abdomen:     Soft, non-tender, bowel sounds active all four quadrants,    no masses, no organomegaly  Genitalia:    Pelvic: cervix normal in appearance, external genitalia normal, no adnexal masses or tenderness, no cervical motion tenderness, rectovaginal septum normal, uterus normal size, shape, and consistency and vagina normal without discharge  Extremities:   Extremities normal, atraumatic, no cyanosis or edema  Pulses:   2+ and symmetric all extremities  Skin:   Skin color, texture, turgor normal, no rashes or lesions  Lymph nodes:   Cervical, supraclavicular, and axillary nodes normal  Neurologic:   CNII-XII intact, normal strength, sensation and reflexes    throughout       Assessment and Plan:   Routine general medical examination at a health care facility Annual comprehensive exam was done including breast, excluding  pelvic and PAP smear. All screenings have been addressed .    Updated Medication List Outpatient Encounter Prescriptions as of 11/01/2013  Medication Sig  . ALPRAZolam (XANAX) 0.5 MG tablet Take 1 tablet (0.5 mg total) by mouth at bedtime as needed.  . fexofenadine (ALLEGRA) 180 MG tablet Take 180 mg by mouth daily as needed.   .Marland Kitchen  fluticasone (FLONASE) 50 MCG/ACT nasal spray Place 2 sprays into both nostrils daily.  . meloxicam (MOBIC) 15 MG tablet Take 15 mg by mouth daily.  . metoprolol succinate (TOPROL-XL) 50 MG 24 hr tablet Take 1 tablet (50 mg total) by mouth daily. Take with or immediately following a meal.  . omeprazole (PRILOSEC) 20 MG capsule Take 1 capsule (20 mg total) by mouth daily.  . SUMAtriptan (IMITREX) 25 MG tablet Take 1 tablet (25 mg total) by mouth every 2 (two) hours as needed.  . triamterene-hydrochlorothiazide (DYAZIDE) 37.5-25 MG per capsule Take 1 each (1 capsule total) by mouth every morning.  . [DISCONTINUED] ALPRAZolam (XANAX) 0.5 MG  tablet Take 1 tablet (0.5 mg total) by mouth at bedtime as needed.  . [DISCONTINUED] metoprolol succinate (TOPROL-XL) 50 MG 24 hr tablet Take 1 tablet (50 mg total) by mouth daily. Take with or immediately following a meal.

## 2013-11-04 NOTE — Assessment & Plan Note (Signed)
Annual comprehensive exam was done including breast, excluding pelvic and PAP smear. All screenings have been addressed .  

## 2013-11-14 ENCOUNTER — Other Ambulatory Visit (INDEPENDENT_AMBULATORY_CARE_PROVIDER_SITE_OTHER): Payer: BC Managed Care – PPO

## 2013-11-14 DIAGNOSIS — E785 Hyperlipidemia, unspecified: Secondary | ICD-10-CM

## 2013-11-14 LAB — LIPID PANEL
Cholesterol: 182 mg/dL (ref 0–200)
HDL: 45.9 mg/dL (ref >39.00)
LDL Cholesterol: 110 mg/dL — ABNORMAL HIGH (ref 0–99)
NonHDL: 136.1
Total CHOL/HDL Ratio: 4
Triglycerides: 133 mg/dL (ref 0.0–149.0)
VLDL: 26.6 mg/dL (ref 0.0–40.0)

## 2014-04-09 ENCOUNTER — Other Ambulatory Visit: Payer: Self-pay | Admitting: Internal Medicine

## 2014-05-07 ENCOUNTER — Encounter: Payer: Self-pay | Admitting: Internal Medicine

## 2014-05-07 ENCOUNTER — Ambulatory Visit (INDEPENDENT_AMBULATORY_CARE_PROVIDER_SITE_OTHER): Payer: BLUE CROSS/BLUE SHIELD | Admitting: Internal Medicine

## 2014-05-07 ENCOUNTER — Encounter: Payer: Self-pay | Admitting: *Deleted

## 2014-05-07 VITALS — BP 118/80 | HR 68 | Temp 97.6°F | Resp 16 | Ht 63.0 in | Wt 173.5 lb

## 2014-05-07 DIAGNOSIS — I10 Essential (primary) hypertension: Secondary | ICD-10-CM

## 2014-05-07 DIAGNOSIS — K219 Gastro-esophageal reflux disease without esophagitis: Secondary | ICD-10-CM

## 2014-05-07 DIAGNOSIS — R5383 Other fatigue: Secondary | ICD-10-CM | POA: Diagnosis not present

## 2014-05-07 DIAGNOSIS — E785 Hyperlipidemia, unspecified: Secondary | ICD-10-CM | POA: Diagnosis not present

## 2014-05-07 DIAGNOSIS — E669 Obesity, unspecified: Secondary | ICD-10-CM | POA: Diagnosis not present

## 2014-05-07 LAB — COMPREHENSIVE METABOLIC PANEL
ALT: 17 U/L (ref 0–35)
AST: 19 U/L (ref 0–37)
Albumin: 4.1 g/dL (ref 3.5–5.2)
Alkaline Phosphatase: 80 U/L (ref 39–117)
BUN: 17 mg/dL (ref 6–23)
CO2: 27 mEq/L (ref 19–32)
Calcium: 9.4 mg/dL (ref 8.4–10.5)
Chloride: 102 mEq/L (ref 96–112)
Creatinine, Ser: 1 mg/dL (ref 0.4–1.2)
GFR: 65.35 mL/min (ref >60.00)
Glucose, Bld: 104 mg/dL — ABNORMAL HIGH (ref 70–99)
Potassium: 4.3 mEq/L (ref 3.5–5.1)
Sodium: 139 mEq/L (ref 135–145)
Total Bilirubin: 0.3 mg/dL (ref 0.2–1.2)
Total Protein: 7.8 g/dL (ref 6.0–8.3)

## 2014-05-07 LAB — LDL CHOLESTEROL, DIRECT: Direct LDL: 117.2 mg/dL

## 2014-05-07 LAB — LIPID PANEL
Cholesterol: 204 mg/dL — ABNORMAL HIGH (ref 0–200)
HDL: 47.9 mg/dL (ref >39.00)
NonHDL: 156.1
Total CHOL/HDL Ratio: 4
Triglycerides: 249 mg/dL — ABNORMAL HIGH (ref 0.0–149.0)
VLDL: 49.8 mg/dL — ABNORMAL HIGH (ref 0.0–40.0)

## 2014-05-07 LAB — TSH: TSH: 2.44 u[IU]/mL (ref 0.35–4.50)

## 2014-05-07 NOTE — Assessment & Plan Note (Addendum)
I spent 15 minutes addressing  BMI and recommended wt loss of 10% of body weigh over the next 6 months using a low glycemic index diet and regular exercise a minimum of 5 days per week. She has had difficulty losing weight due to increased appetite.  Thyroid function is normal and there are no signs of diabetes on screening labs today  A trial of  Phentermine was discussed but not put into motion until patient has made an organized  attempt with low GI diet and exercise.  She is aware of the possible side effects and risks and understands that if the medication is started, it will be discontinued if she has not lost 5% of her body weight over the next 3 months, which , based on today's weight is 8 lbs.  Lab Results  Component Value Date   CHOL 204* 05/07/2014   HDL 47.90 05/07/2014   LDLCALC 110* 11/14/2013   LDLDIRECT 117.2 05/07/2014   TRIG 249.0* 05/07/2014   CHOLHDL 4 05/07/2014  ' Lab Results  Component Value Date   TSH 2.44 05/07/2014     Wt Readings from Last 3 Encounters:  05/07/14 173 lb 8 oz (78.699 kg)  11/01/13 171 lb (77.565 kg)  05/17/13 173 lb (78.472 kg)

## 2014-05-07 NOTE — Progress Notes (Signed)
Pre-visit discussion using our clinic review tool. No additional management support is needed unless otherwise documented below in the visit note.  

## 2014-05-07 NOTE — Patient Instructions (Signed)
This is  One version of a  "Low GI"  Diet:  It will allow you to lose 4 to 8  lbs  per month if you follow it carefully.  Your goal with exercise is a minimum of 30 minutes of aerobic exercise 5 days per week (Walking does not count once it becomes easy!)     All of the foods can be found at grocery stores and in bulk at [employer].  The Atkins protein bars and shakes are available in more varieties at Target, Wal[employer] and [employer].     7 AM Breakfast:  Choose from the following:  Low carbohydrate Protein  Shakes (I recommend the EAS AdvantEdge "Carb Control" shakes  Or the low carb shakes by Atkins.    2.5 carbs   Arnold's "Sandwhich Thin"toasted  w/ peanut butter (no jelly: about 20 net carbs  "Bagel Thin" with cream cheese and salmon: about 20 carbs   a scrambled egg/bacon/cheese burrito made with Mission's "carb balance" whole wheat tortilla  (about 10 net carbs )   Avoid cereal and bananas, oatmeal and cream of wheat and grits. They are loaded with carbohydrates!   10 AM: high protein snack  Protein bar by Atkins (the snack size, under 200 cal, usually < 6 net carbs).    A stick of cheese:  Around 1 carb,  100 cal     Dannon Light n Fit [place] Yogurt  (80 cal, 8 carbs)  Other so called "protein bars" and Greek yogurts tend to be loaded with carbohydrates.  Remember, in food advertising, the word "energy" is synonymous for " carbohydrate."  Lunch:   A Sandwich using the bread choices listed, Can use any  Eggs,  lunchmeat, grilled meat or canned tuna), avocado, regular mayo/mustard  and cheese.  A Salad using blue cheese, ranch,  Goddess or vinagrette,  No croutons or "confetti" and no "candied nuts" but regular nuts OK.   No pretzels or chips.  Pickles and miniature sweet peppers are a good low carb alternative that provide a "crunch"  The bread is the only source of carbohydrate in a sandwich and  can be decreased by trying some of these alternatives to traditional loaf  bread  Joseph's makes a pita bread and a flat bread that are 50 cal and 4 net carbs available at BJs and WalMart.  This can be toasted to use with hummous as well  Toufayan makes a low carb flatbread that's 100 cal and 9 net carbs available at [employer] and [employer] makes 2 sizes of  Low carb whole wheat tortilla  (The large one is 210 cal and 6 net carbs) Avoid "Low fat dressings, as well as Reyne Dumas and [address] dressings They are loaded with sugar!   3 PM/ Mid day  Snack:  Consider  1 ounce of  almonds, walnuts, pistachios, pecans, peanuts,  Macadamia nuts or a nut medley.  Avoid "granola"; the dried cranberries and raisins are loaded with carbohydrates. Mixed nuts as long as there are no raisins,  cranberries or dried fruit.    Try the prosciutto/mozzarella cheese sticks by Fiorruci  In deli /backery section   High protein      6 PM  Dinner:     Meat/fowl/fish with a green salad, and either broccoli, cauliflower, green beans, spinach, brussel sprouts or  Lima beans. DO NOT BREAD THE PROTEIN!!      There is a low carb pasta by Dreamfield's that is  acceptable and tastes great: only 5 digestible carbs/serving.( All grocery stores but BJs carry it )  Try Kai Levins Angelo's chicken piccata or chicken or eggplant parm over low carb pasta.(Lowes and BJs)   Clifton Custard Sanchez's "Carnitas" (pulled pork, no sauce,  0 carbs) or his beef pot roast to make a dinner burrito (at BJ's)  Pesto over low carb pasta (bj's sells a good quality pesto in the center refrigerated section of the deli   Try satueeing  Roosvelt Harps with mushroooms  Whole wheat pasta is still full of digestible carbs and  Not as low in glycemic index as Dreamfield's.   Brown rice is still rice,  So skip the rice and noodles if you eat [place] or [place] (or at least limit to 1/2 cup)  9 PM snack :   Breyer's "low carb" fudgsicle or  ice cream bar (Carb Smart line), or  Weight Watcher's ice cream bar , or another "no sugar added" ice  cream;  a serving of fresh berries/cherries with whipped cream   Cheese or DANNON'S LlGHT N FIT GREEK YOGURT  8 ounces of [place] unsweetened almond/cococunut milk    Avoid bananas, pineapple, grapes  and watermelon on a regular basis because they are high in sugar.  THINK OF THEM AS DESSERT  Remember that snack Substitutions should be less than 10 NET carbs per serving and meals < 20 carbs. Remember to subtract fiber grams to get the "net carbs."

## 2014-05-07 NOTE — Progress Notes (Signed)
Patient ID: [name], female   DOB: February 02, 1961, 54 y.o.   MRN: [id number]   Patient Active Problem List   Diagnosis Date Noted  . Routine general medical examination at a health care facility 10/18/2012  . Obesity (BMI 30-39.9) 11/05/2011  . GERD (gastroesophageal reflux disease)   . Hypertension   . Migraine syndrome     Subjective:  CC:   Chief Complaint  Patient presents with  . Follow-up    General medication refill,   . Hypertension    HPI:   ARRABELLA WESTERMAN is a 53 y.o. female who presents for 6 month follow up on chronic issues including hypertension , GERD and obesity.  She feels generally well, has not had any migraines recently and denies any new issues.  ,  She has gained  2 lbs and is not exercising  or follow  A weight loss diet but is interested in losing weight. She is taking her medications as directed and reports no side effects.     Past Medical History  Diagnosis Date  . GERD (gastroesophageal reflux disease)   . Hypertension   . Migraine syndrome     Past Surgical History  Procedure Laterality Date  . Dilation and curettage of uterus    . Tubal ligation         The following portions of the patient's history were reviewed and updated as appropriate: Allergies, current medications, and problem list.    Review of Systems:   Patient denies headache, fevers, malaise, unintentional weight loss, skin rash, eye pain, sinus congestion and sinus pain, sore throat, dysphagia,  hemoptysis , cough, dyspnea, wheezing, chest pain, palpitations, orthopnea, edema, abdominal pain, nausea, melena, diarrhea, constipation, flank pain, dysuria, hematuria, urinary  Frequency, nocturia, numbness, tingling, seizures,  Focal weakness, Loss of consciousness,  Tremor, insomnia, depression, anxiety, and suicidal ideation.     History   Social History  . Marital Status: Married    Spouse Name: N/A    Number of Children: N/A  . Years of Education: N/A    Occupational History  . Not on file.   Social History Main Topics  . Smoking status: Never Smoker   . Smokeless tobacco: Never Used  . Alcohol Use: Yes  . Drug Use: No  . Sexual Activity: Not on file   Other Topics Concern  . Not on file   Social History Narrative    Objective:  Filed Vitals:   05/07/14 0805  BP: 118/80  Pulse: 68  Temp: 97.6 F (36.4 C)  Resp: 16     General appearance: alert, cooperative and appears stated age Ears: normal TM's and external ear canals both ears Throat: lips, mucosa, and tongue normal; teeth and gums normal Neck: no adenopathy, no carotid bruit, supple, symmetrical, trachea midline and thyroid not enlarged, symmetric, no tenderness/mass/nodules Back: symmetric, no curvature. ROM normal. No CVA tenderness. Lungs: clear to auscultation bilaterally Heart: regular rate and rhythm, S1, S2 normal, no murmur, click, rub or gallop Abdomen: soft, non-tender; bowel sounds normal; no masses,  no organomegaly Pulses: 2+ and symmetric Skin: Skin color, texture, turgor normal. No rashes or lesions Lymph nodes: Cervical, supraclavicular, and axillary nodes normal.  Assessment and Plan:  Obesity (BMI 30-39.9) I spent 15 minutes addressing  BMI and recommended wt loss of 10% of body weigh over the next 6 months using a low glycemic index diet and regular exercise a minimum of 5 days per week. She has had difficulty losing weight due  to increased appetite.  Thyroid function is normal and there are no signs of diabetes on screening labs today  A trial of  Phentermine was discussed but not put into motion until patient has made an organized  attempt with low GI diet and exercise.  She is aware of the possible side effects and risks and understands that if the medication is started, it will be discontinued if she has not lost 5% of her body weight over the next 3 months, which , based on today's weight is 8 lbs.  Lab Results  Component Value Date    CHOL 204* 05/07/2014   HDL 47.90 05/07/2014   LDLCALC 110* 11/14/2013   LDLDIRECT 117.2 05/07/2014   TRIG 249.0* 05/07/2014   CHOLHDL 4 05/07/2014  ' Lab Results  Component Value Date   TSH 2.44 05/07/2014     Wt Readings from Last 3 Encounters:  05/07/14 173 lb 8 oz (78.699 kg)  11/01/13 171 lb (77.565 kg)  05/17/13 173 lb (78.472 kg)     GERD (gastroesophageal reflux disease)   Reviewed behavioral modifications including avoidance of mint, chocolate and alcohol and overeating.  Advised to remain upright for at least 2 hours after eating.  .  Continue omeprazole  dakl yfor history of esophageal streicutre seen prior to 2013 on UGI study,  Not interested in GI referal.  Hypertension Well controlled on current regimen. Renal function stable, no changes today.  Lab Results  Component Value Date   NA 139 05/07/2014   K 4.3 05/07/2014   CL 102 05/07/2014   CO2 27 05/07/2014   Lab Results  Component Value Date   CREATININE 1.0 05/07/2014     A total of 25 minutes of face to face time was spent with patient more than half of which was spent in counselling on the above mentioned issues.    Updated Medication List Outpatient Encounter Prescriptions as of 05/07/2014  Medication Sig  . ALPRAZolam (XANAX) 0.5 MG tablet Take 1 tablet (0.5 mg total) by mouth at bedtime as needed.  . metoprolol succinate (TOPROL-XL) 50 MG 24 hr tablet Take 1 tablet (50 mg total) by mouth daily. Take with or immediately following a meal.  . omeprazole (PRILOSEC) 20 MG capsule Take 1 capsule (20 mg total) by mouth daily.  . SUMAtriptan (IMITREX) 25 MG tablet TAKE 1 TABLET AT ONSET OF HEADACHE AND ONE EVERY 2 HOURS AS NEEDED UNTIL RELIEVED  NOT TO EXCEED 8 IN 24 HOURS  . triamterene-hydrochlorothiazide (DYAZIDE) 37.5-25 MG per capsule TAKE ONE CAPSULE EACH MORNING  . fexofenadine (ALLEGRA) 180 MG tablet Take 180 mg by mouth daily as needed.   . fluticasone (FLONASE) 50 MCG/ACT nasal spray Place 2  sprays into both nostrils daily. (Patient not taking: Reported on 05/07/2014)  . meloxicam (MOBIC) 15 MG tablet Take 15 mg by mouth daily.  . [DISCONTINUED] Tdap (BOOSTRIX) injection 0.5 mL      Orders Placed This Encounter  Procedures  . Comprehensive metabolic panel  . Lipid panel  . TSH  . LDL cholesterol, direct    No Follow-up on file.

## 2014-05-08 ENCOUNTER — Telehealth: Payer: Self-pay | Admitting: Internal Medicine

## 2014-05-08 ENCOUNTER — Encounter: Payer: Self-pay | Admitting: Internal Medicine

## 2014-05-08 NOTE — Telephone Encounter (Signed)
emmi emailed °

## 2014-05-08 NOTE — Assessment & Plan Note (Signed)
Well controlled on current regimen. Renal function stable, no changes today.  Lab Results  Component Value Date   NA 139 05/07/2014   K 4.3 05/07/2014   CL 102 05/07/2014   CO2 27 05/07/2014   Lab Results  Component Value Date   CREATININE 1.0 05/07/2014

## 2014-05-08 NOTE — Assessment & Plan Note (Addendum)
Reviewed behavioral modifications including avoidance of mint, chocolate and alcohol and overeating.  Advised to remain upright for at least 2 hours after eating.  .  Continue omeprazole  dakl yfor history of esophageal streicutre seen prior to 2013 on UGI study,  Not interested in GI referal.

## 2014-05-09 ENCOUNTER — Encounter: Payer: Self-pay | Admitting: *Deleted

## 2014-05-17 ENCOUNTER — Telehealth: Payer: Self-pay | Admitting: Internal Medicine

## 2014-05-17 NOTE — Telephone Encounter (Signed)
Patient was very polite and understanding and patient stated when she uses alprazolam she does not combine with alcohol, re- enforced to patient the danger of alcohol and medication. Patient stated she would use the prescribed moderation.

## 2014-05-17 NOTE — Telephone Encounter (Signed)
Was positive for alcohol. Please remind patient that "moderate" consumption of alcohol is one drink per night for women, and NOT TO COMBINE ALCOHOL WITH ALPRAZOLAM

## 2014-05-29 ENCOUNTER — Encounter: Payer: Self-pay | Admitting: Internal Medicine

## 2014-06-16 ENCOUNTER — Other Ambulatory Visit: Payer: Self-pay | Admitting: Internal Medicine

## 2014-06-28 ENCOUNTER — Ambulatory Visit: Payer: Self-pay | Admitting: Internal Medicine

## 2014-06-28 LAB — HM MAMMOGRAPHY: HM Mammogram: NORMAL

## 2014-06-29 ENCOUNTER — Encounter: Payer: Self-pay | Admitting: Internal Medicine

## 2014-08-22 ENCOUNTER — Other Ambulatory Visit: Payer: Self-pay | Admitting: Internal Medicine

## 2014-08-22 NOTE — Telephone Encounter (Signed)
Last visit 05/07/14, ok refill?

## 2014-08-22 NOTE — Telephone Encounter (Signed)
Ok to refill,  Refill printed 

## 2014-08-23 NOTE — Telephone Encounter (Signed)
Faxed to pharmacy

## 2014-09-05 ENCOUNTER — Ambulatory Visit (INDEPENDENT_AMBULATORY_CARE_PROVIDER_SITE_OTHER): Payer: BLUE CROSS/BLUE SHIELD | Admitting: Podiatry

## 2014-09-05 ENCOUNTER — Encounter: Payer: Self-pay | Admitting: Podiatry

## 2014-09-05 ENCOUNTER — Ambulatory Visit (INDEPENDENT_AMBULATORY_CARE_PROVIDER_SITE_OTHER): Payer: BLUE CROSS/BLUE SHIELD

## 2014-09-05 ENCOUNTER — Telehealth: Payer: Self-pay | Admitting: Podiatry

## 2014-09-05 VITALS — BP 131/91 | HR 93 | Resp 16 | Ht 63.0 in | Wt 173.0 lb

## 2014-09-05 DIAGNOSIS — M779 Enthesopathy, unspecified: Secondary | ICD-10-CM

## 2014-09-05 DIAGNOSIS — M7751 Other enthesopathy of right foot: Secondary | ICD-10-CM | POA: Diagnosis not present

## 2014-09-05 DIAGNOSIS — M778 Other enthesopathies, not elsewhere classified: Secondary | ICD-10-CM

## 2014-09-05 MED ORDER — METHYLPREDNISOLONE 4 MG PO TBPK: ORAL_TABLET | ORAL | Status: DC

## 2014-09-05 NOTE — Progress Notes (Signed)
   Subjective:    Patient ID: Vanessa Richards, female    DOB: Dec 17, 1960, 54 y.o.   MRN: [id number]  HPI Walking with the girls at [employer], was walking and after about a hour i could not walk anymore. The pain is right under the ankle bone, tried a brace and compression hose. Now the pain is going across the top of my foot. The muscles in my foot feel real weak    Review of Systems     Objective:   Physical Exam: I have reviewed her past medical history medications allergies surgery social history and review of systems. Pulses are strongly palpable. Neurologic sensorium is intact versus once the monofilament. Deep tendon reflexes are intact bilateral and muscle strength +5 over 5 dorsiflexion plantar flexors and inverters everted on his musculature is intact. Orthopedic evaluation of a straight solid joints distal to the ankle for range of motion without crepitation. She does have pain on palpation. Tenderness overlying an os perineum as well as pain on palpation of the sinus tarsi of the right foot. Radiographs do not demonstrate any type of osseus abnormalities Rule out a possible fracture to the anterior process of the calcaneus.        Assessment & Plan:  Assessment: Subtalar joint capsulitis right. Peroneal tendinitis right.  Plan: Discussed etiology pathology conservative versus surgical therapies. Injected the subtalar joint today after sterile Betadine skin prep with Kenalog and local anesthetic. Wrote a prescription for Medrol Dosepak and I will follow-up with her in 1 month.

## 2014-09-05 NOTE — Telephone Encounter (Signed)
Pt called was there this morning and had an injection and is having a reaction. Foot is swelling, severe pain and she cannot bend foot. Pt is having family member pick her up because she is not able to drive.please call asap.

## 2014-09-05 NOTE — Telephone Encounter (Signed)
SPOKE TO Vanessa Richards REGARDING THE PAIN SHE WAS HAVING, EXPLAINED TO HER THAT IT WAS STEROID FLARE, THAT SHE IS TO ICE , ELEVATE AND STAY OFF HER FOOT AS MUCH AS POSSIBLE. SHE WILL START HER MEDROL DOS PAK AND IT WILL CALM DOWN IN THE NEXT FEW DAYS

## 2014-10-03 ENCOUNTER — Ambulatory Visit (INDEPENDENT_AMBULATORY_CARE_PROVIDER_SITE_OTHER): Payer: BLUE CROSS/BLUE SHIELD | Admitting: Podiatry

## 2014-10-03 ENCOUNTER — Encounter: Payer: Self-pay | Admitting: Podiatry

## 2014-10-03 VITALS — Ht 63.0 in | Wt 170.0 lb

## 2014-10-03 DIAGNOSIS — M7751 Other enthesopathy of right foot: Secondary | ICD-10-CM

## 2014-10-03 DIAGNOSIS — M778 Other enthesopathies, not elsewhere classified: Secondary | ICD-10-CM

## 2014-10-03 DIAGNOSIS — M779 Enthesopathy, unspecified: Principal | ICD-10-CM

## 2014-10-03 NOTE — Progress Notes (Signed)
She presents today for follow-up of her subtalar joint capsulitis right she states this proximally 95% improved.  Objective: Vital signs are stable she is alert and oriented 3 she still has pain on palpation of the sinus tarsi right.  Assessment: Subtalar joint capsulitis right foot.  Plan: Injected 4 mg of dexamethasone after sterile Betadine skin prep subtalar joint right.

## 2014-11-06 ENCOUNTER — Other Ambulatory Visit: Payer: Self-pay | Admitting: *Deleted

## 2014-11-06 MED ORDER — TRIAMTERENE-HCTZ 37.5-25 MG PO CAPS: ORAL_CAPSULE | ORAL | Status: DC

## 2014-11-07 ENCOUNTER — Ambulatory Visit: Payer: BLUE CROSS/BLUE SHIELD | Admitting: Podiatry

## 2014-11-08 ENCOUNTER — Encounter: Payer: Self-pay | Admitting: *Deleted

## 2014-11-08 ENCOUNTER — Ambulatory Visit (INDEPENDENT_AMBULATORY_CARE_PROVIDER_SITE_OTHER): Payer: BLUE CROSS/BLUE SHIELD | Admitting: Internal Medicine

## 2014-11-08 ENCOUNTER — Encounter (INDEPENDENT_AMBULATORY_CARE_PROVIDER_SITE_OTHER): Payer: Self-pay

## 2014-11-08 ENCOUNTER — Encounter: Payer: Self-pay | Admitting: Internal Medicine

## 2014-11-08 VITALS — BP 138/78 | HR 82 | Temp 98.1°F | Resp 16 | Ht 63.0 in | Wt 173.0 lb

## 2014-11-08 DIAGNOSIS — Z0000 Encounter for general adult medical examination without abnormal findings: Secondary | ICD-10-CM

## 2014-11-08 DIAGNOSIS — I10 Essential (primary) hypertension: Secondary | ICD-10-CM | POA: Diagnosis not present

## 2014-11-08 DIAGNOSIS — Z1239 Encounter for other screening for malignant neoplasm of breast: Secondary | ICD-10-CM

## 2014-11-08 DIAGNOSIS — E669 Obesity, unspecified: Secondary | ICD-10-CM

## 2014-11-08 DIAGNOSIS — R1310 Dysphagia, unspecified: Secondary | ICD-10-CM | POA: Diagnosis not present

## 2014-11-08 DIAGNOSIS — K228 Other specified diseases of esophagus: Secondary | ICD-10-CM

## 2014-11-08 DIAGNOSIS — Z1159 Encounter for screening for other viral diseases: Secondary | ICD-10-CM

## 2014-11-08 DIAGNOSIS — K2289 Other specified disease of esophagus: Secondary | ICD-10-CM

## 2014-11-08 DIAGNOSIS — E785 Hyperlipidemia, unspecified: Secondary | ICD-10-CM

## 2014-11-08 LAB — COMPREHENSIVE METABOLIC PANEL
ALT: 16 U/L (ref 0–35)
AST: 19 U/L (ref 0–37)
Albumin: 4.2 g/dL (ref 3.5–5.2)
Alkaline Phosphatase: 79 U/L (ref 39–117)
BUN: 14 mg/dL (ref 6–23)
CO2: 29 mEq/L (ref 19–32)
Calcium: 9.9 mg/dL (ref 8.4–10.5)
Chloride: 99 mEq/L (ref 96–112)
Creatinine, Ser: 0.85 mg/dL (ref 0.40–1.20)
GFR: 74.16 mL/min (ref >60.00)
Glucose, Bld: 91 mg/dL (ref 70–99)
Potassium: 4.1 mEq/L (ref 3.5–5.1)
Sodium: 137 mEq/L (ref 135–145)
Total Bilirubin: 0.3 mg/dL (ref 0.2–1.2)
Total Protein: 7.8 g/dL (ref 6.0–8.3)

## 2014-11-08 LAB — LDL CHOLESTEROL, DIRECT: Direct LDL: 127 mg/dL

## 2014-11-08 LAB — LIPID PANEL
Cholesterol: 220 mg/dL — ABNORMAL HIGH (ref 0–200)
HDL: 53.6 mg/dL (ref >39.00)
NonHDL: 166.4
Total CHOL/HDL Ratio: 4
Triglycerides: 213 mg/dL — ABNORMAL HIGH (ref 0.0–149.0)
VLDL: 42.6 mg/dL — ABNORMAL HIGH (ref 0.0–40.0)

## 2014-11-08 LAB — TSH: TSH: 2.31 u[IU]/mL (ref 0.35–4.50)

## 2014-11-08 NOTE — Patient Instructions (Signed)
i WANT YOU TO LOSE 10 LBS OVER THE NEXT 6  WITH DIET AND EXERCISE ,  TO GET YOUR BMI < 30  ONCE YOU START YOU NATURAL DIET PILL,  RETURN FOR A BP CHECK AND LIVER ENZYMES  (YOU DON'T HAVE TO SEE ME)   WE ARE ORDERING AN ESOPHAGEAL STUDY   Health Maintenance Adopting a healthy lifestyle and getting preventive care can go a long way to promote health and wellness. Talk with your health care provider about what schedule of regular examinations is right for you. This is a good chance for you to check in with your provider about disease prevention and staying healthy. In between checkups, there are plenty of things you can do on your own. Experts have done a lot of research about which lifestyle changes and preventive measures are most likely to keep you healthy. Ask your health care provider for more information. WEIGHT AND DIET  Eat a healthy diet  Be sure to include plenty of vegetables, fruits, low-fat dairy products, and lean protein.  Do not eat a lot of foods high in solid fats, added sugars, or salt.  Get regular exercise. This is one of the most important things you can do for your health.  Most adults should exercise for at least 150 minutes each week. The exercise should increase your heart rate and make you sweat (moderate-intensity exercise).  Most adults should also do strengthening exercises at least twice a week. This is in addition to the moderate-intensity exercise.  Maintain a healthy weight  Body mass index (BMI) is a measurement that can be used to identify possible weight problems. It estimates body fat based on height and weight. Your health care provider can help determine your BMI and help you achieve or maintain a healthy weight.  For females 67 years of age and older:   A BMI below 18.5 is considered underweight.  A BMI of 18.5 to 24.9 is normal.  A BMI of 25 to 29.9 is considered overweight.  A BMI of 30 and above is considered obese.  Watch levels of  cholesterol and blood lipids  You should start having your blood tested for lipids and cholesterol at 54 years of age, then have this test every 5 years.  You may need to have your cholesterol levels checked more often if:  Your lipid or cholesterol levels are high.  You are older than 54 years of age.  You are at high risk for heart disease.  CANCER SCREENING   Lung Cancer  Lung cancer screening is recommended for adults 61-78 years old who are at high risk for lung cancer because of a history of smoking.  A yearly low-dose CT scan of the lungs is recommended for people who:  Currently smoke.  Have quit within the past 15 years.  Have at least a 30-pack-year history of smoking. A pack year is smoking an average of one pack of cigarettes a day for 1 year.  Yearly screening should continue until it has been 15 years since you quit.  Yearly screening should stop if you develop a health problem that would prevent you from having lung cancer treatment.  Breast Cancer  Practice breast self-awareness. This means understanding how your breasts normally appear and feel.  It also means doing regular breast self-exams. Let your health care provider know about any changes, no matter how small.  If you are in your 20s or 30s, you should have a clinical breast exam (CBE) by  a health care provider every 1-3 years as part of a regular health exam.  If you are 25 or older, have a CBE every year. Also consider having a breast X-ray (mammogram) every year.  If you have a family history of breast cancer, talk to your health care provider about genetic screening.  If you are at high risk for breast cancer, talk to your health care provider about having an MRI and a mammogram every year.  Breast cancer gene (BRCA) assessment is recommended for women who have family members with BRCA-related cancers. BRCA-related cancers include:  Breast.  Ovarian.  Tubal.  Peritoneal  cancers.  Results of the assessment will determine the need for genetic counseling and BRCA1 and BRCA2 testing. Cervical Cancer Routine pelvic examinations to screen for cervical cancer are no longer recommended for nonpregnant women who are considered low risk for cancer of the pelvic organs (ovaries, uterus, and vagina) and who do not have symptoms. A pelvic examination may be necessary if you have symptoms including those associated with pelvic infections. Ask your health care provider if a screening pelvic exam is right for you.   The Pap test is the screening test for cervical cancer for women who are considered at risk.  If you had a hysterectomy for a problem that was not cancer or a condition that could lead to cancer, then you no longer need Pap tests.  If you are older than 65 years, and you have had normal Pap tests for the past 10 years, you no longer need to have Pap tests.  If you have had past treatment for cervical cancer or a condition that could lead to cancer, you need Pap tests and screening for cancer for at least 20 years after your treatment.  If you no longer get a Pap test, assess your risk factors if they change (such as having a new sexual partner). This can affect whether you should start being screened again.  Some women have medical problems that increase their chance of getting cervical cancer. If this is the case for you, your health care provider may recommend more frequent screening and Pap tests.  The human papillomavirus (HPV) test is another test that may be used for cervical cancer screening. The HPV test looks for the virus that can cause cell changes in the cervix. The cells collected during the Pap test can be tested for HPV.  The HPV test can be used to screen women 49 years of age and older. Getting tested for HPV can extend the interval between normal Pap tests from three to five years.  An HPV test also should be used to screen women of any age who  have unclear Pap test results.  After 54 years of age, women should have HPV testing as often as Pap tests.  Colorectal Cancer  This type of cancer can be detected and often prevented.  Routine colorectal cancer screening usually begins at 54 years of age and continues through 54 years of age.  Your health care provider may recommend screening at an earlier age if you have risk factors for colon cancer.  Your health care provider may also recommend using home test kits to check for hidden blood in the stool.  A small camera at the end of a tube can be used to examine your colon directly (sigmoidoscopy or colonoscopy). This is done to check for the earliest forms of colorectal cancer.  Routine screening usually begins at age 86.  Direct examination  of the colon should be repeated every 5-10 years through 54 years of age. However, you may need to be screened more often if early forms of precancerous polyps or small growths are found. Skin Cancer  Check your skin from head to toe regularly.  Tell your health care provider about any new moles or changes in moles, especially if there is a change in a mole's shape or color.  Also tell your health care provider if you have a mole that is larger than the size of a pencil eraser.  Always use sunscreen. Apply sunscreen liberally and repeatedly throughout the day.  Protect yourself by wearing long sleeves, pants, a wide-brimmed hat, and sunglasses whenever you are outside. HEART DISEASE, DIABETES, AND HIGH BLOOD PRESSURE   Have your blood pressure checked at least every 1-2 years. High blood pressure causes heart disease and increases the risk of stroke.  If you are between 60 years and 58 years old, ask your health care provider if you should take aspirin to prevent strokes.  Have regular diabetes screenings. This involves taking a blood sample to check your fasting blood sugar level.  If you are at a normal weight and have a low risk for  diabetes, have this test once every three years after 54 years of age.  If you are overweight and have a high risk for diabetes, consider being tested at a younger age or more often. PREVENTING INFECTION  Hepatitis B  If you have a higher risk for hepatitis B, you should be screened for this virus. You are considered at high risk for hepatitis B if:  You were born in a country where hepatitis B is common. Ask your health care provider which countries are considered high risk.  Your parents were born in a high-risk country, and you have not been immunized against hepatitis B (hepatitis B vaccine).  You have HIV or AIDS.  You use needles to inject street drugs.  You live with someone who has hepatitis B.  You have had sex with someone who has hepatitis B.  You get hemodialysis treatment.  You take certain medicines for conditions, including cancer, organ transplantation, and autoimmune conditions. Hepatitis C  Blood testing is recommended for:  Everyone born from [birth year] through 1965.  Anyone with known risk factors for hepatitis C. Sexually transmitted infections (STIs)  You should be screened for sexually transmitted infections (STIs) including gonorrhea and chlamydia if:  You are sexually active and are younger than 54 years of age.  You are older than 54 years of age and your health care provider tells you that you are at risk for this type of infection.  Your sexual activity has changed since you were last screened and you are at an increased risk for chlamydia or gonorrhea. Ask your health care provider if you are at risk.  If you do not have HIV, but are at risk, it may be recommended that you take a prescription medicine daily to prevent HIV infection. This is called pre-exposure prophylaxis (PrEP). You are considered at risk if:  You are sexually active and do not regularly use condoms or know the HIV status of your partner(s).  You take drugs by injection.  You are  sexually active with a partner who has HIV. Talk with your health care provider about whether you are at high risk of being infected with HIV. If you choose to begin PrEP, you should first be tested for HIV. You should then be tested every  3 months for as long as you are taking PrEP.  PREGNANCY   If you are premenopausal and you may become pregnant, ask your health care provider about preconception counseling.  If you may become pregnant, take 400 to 800 micrograms (mcg) of folic acid every day.  If you want to prevent pregnancy, talk to your health care provider about birth control (contraception). OSTEOPOROSIS AND MENOPAUSE   Osteoporosis is a disease in which the bones lose minerals and strength with aging. This can result in serious bone fractures. Your risk for osteoporosis can be identified using a bone density scan.  If you are 74 years of age or older, or if you are at risk for osteoporosis and fractures, ask your health care provider if you should be screened.  Ask your health care provider whether you should take a calcium or vitamin D supplement to lower your risk for osteoporosis.  Menopause may have certain physical symptoms and risks.  Hormone replacement therapy may reduce some of these symptoms and risks. Talk to your health care provider about whether hormone replacement therapy is right for you.  HOME CARE INSTRUCTIONS   Schedule regular health, dental, and eye exams.  Stay current with your immunizations.   Do not use any tobacco products including cigarettes, chewing tobacco, or electronic cigarettes.  If you are pregnant, do not drink alcohol.  If you are breastfeeding, limit how much and how often you drink alcohol.  Limit alcohol intake to no more than 1 drink per day for nonpregnant women. One drink equals 12 ounces of beer, 5 ounces of wine, or 1 ounces of hard liquor.  Do not use street drugs.  Do not share needles.  Ask your health care provider for  help if you need support or information about quitting drugs.  Tell your health care provider if you often feel depressed.  Tell your health care provider if you have ever been abused or do not feel safe at home. Document Released: 11/03/2010 Document Revised: 09/04/2013 Document Reviewed: 03/22/2013 [facility] Patient Information 2015 [place], [place]. This information is not intended to replace advice given to you by your health care provider. Make sure you discuss any questions you have with your health care provider.

## 2014-11-08 NOTE — Progress Notes (Addendum)
Patient ID: Vanessa Richards, female    DOB: [birth year]  Age: 54 y.o. MRN: [id number]  The patient is here for annual  wellness examination and management of other chronic and acute problems.   The risk factors are reflected in the social history.  The roster of all physicians providing medical care to patient - is listed in the Snapshot section of the chart.  Activities of daily living:  The patient is 100% independent in all ADLs: dressing, toileting, feeding as well as independent mobility  Home safety : The patient has smoke detectors in the home. They wear seatbelts.  There are no firearms at home. There is no violence in the home.   There is no risks for hepatitis, STDs or HIV. There is no   history of blood transfusion. They have no travel history to infectious disease endemic areas of the world.  The patient has seen their dentist in the last six month. They have seen their eye doctor in the last year. They admit to slight hearing difficulty with regard to whispered voices and some television programs.  They have deferred audiologic testing in the last year.  They do not  have excessive sun exposure. Discussed the need for sun protection: hats, long sleeves and use of sunscreen if there is significant sun exposure.   Diet: the importance of a healthy diet is discussed. They do have a healthy diet.  The benefits of regular aerobic exercise were discussed. She walks 4 times per week ,  20 minutes.   Depression screen: there are no signs or vegative symptoms of depression- irritability, change in appetite, anhedonia, sadness/tearfullness.  Cognitive assessment: the patient manages all their financial and personal affairs and is actively engaged. They could relate day,date,year and events; recalled 2/3 objects at 3 minutes; performed clock-face test normally.  The following portions of the patient's history were reviewed and updated as appropriate: allergies, current medications, past  family history, past medical history,  past surgical history, past social history  and problem list.  Visual acuity was not assessed per patient preference since she has regular follow up with her ophthalmologist. Hearing and body mass index were assessed and reviewed.   During the course of the visit the patient was educated and counseled about appropriate screening and preventive services including : fall prevention , diabetes screening, nutrition counseling, colorectal cancer screening, and recommended immunizations.    CC: The primary encounter diagnosis was Routine general medical examination at a health care facility. Diagnoses of Esophageal pain, Dysphagia, Breast cancer screening, Obesity, Hyperlipidemia, Need for hepatitis C screening test, Obesity (BMI 30-39.9), and Essential hypertension were also pertinent to this visit.  History Talyn has a past medical history of GERD (gastroesophageal reflux disease); Hypertension; and Migraine syndrome.    Sh has been having occasional dysphagia with chicken or rice,  Recently gets stabbing pain in her mid esophagus not associated with swallowing.  She has a history of esophagal stricture that was not dilated during diagnostic EGD  .  Her seasonal rhinitis has not been bothering her WITHOUT  ALLEGRA   Her migraine headaches are occurring about twice annually. .    She has past surgical history that includes Dilation and curettage of uterus and Tubal ligation.   Her family history includes Arthritis (age of onset: 3) in her sister; Early death in her paternal uncle; Heart disease (age of onset: 12) in her paternal grandfather; Heart disease (age of onset: 33) in her father; Hypertension in her father.She  reports that she has never smoked. She has never used smokeless tobacco. She reports that she drinks alcohol. She reports that she does not use illicit drugs.  Outpatient Prescriptions Prior to Visit  Medication Sig Dispense Refill  .  ALPRAZolam (XANAX) 0.5 MG tablet TAKE ONE TABLET BY MOUTH AT BEDTIME AS NEEDED 30 tablet 5  . fexofenadine (ALLEGRA) 180 MG tablet Take 180 mg by mouth daily as needed.     . metoprolol succinate (TOPROL-XL) 50 MG 24 hr tablet TAKE ONE TABLET BY MOUTH EACH DAY FOR BLOOD PRESSURE TAKE WITH A MEALOR IMMEDIATELY AFTER 30 tablet 6  . omeprazole (PRILOSEC) 20 MG capsule Take 1 capsule (20 mg total) by mouth daily. 30 capsule 5  . SUMAtriptan (IMITREX) 25 MG tablet TAKE 1 TABLET AT ONSET OF HEADACHE AND ONE EVERY 2 HOURS AS NEEDED UNTIL RELIEVED  NOT TO EXCEED 8 IN 24 HOURS 9 tablet 6  . triamterene-hydrochlorothiazide (DYAZIDE) 37.5-25 MG per capsule TAKE ONE CAPSULE EACH MORNING 30 capsule 5  . fluticasone (FLONASE) 50 MCG/ACT nasal spray Place 2 sprays into both nostrils daily. (Patient not taking: Reported on 05/07/2014) 16 g 6  . meloxicam (MOBIC) 15 MG tablet Take 15 mg by mouth daily.    . methylPREDNISolone (MEDROL DOSEPAK) 4 MG TBPK tablet Tapering 6 day dos (Patient not taking: Reported on 11/08/2014) 21 tablet 0   No facility-administered medications prior to visit.    Review of Systems   Patient denies headache, fevers, malaise, unintentional weight loss, skin rash, eye pain, sinus congestion and sinus pain, sore throat, dysphagia,  hemoptysis , cough, dyspnea, wheezing, chest pain, palpitations, orthopnea, edema, abdominal pain, nausea, melena, diarrhea, constipation, flank pain, dysuria, hematuria, urinary  Frequency, nocturia, numbness, tingling, seizures,  Focal weakness, Loss of consciousness,  Tremor, insomnia, depression, anxiety, and suicidal ideation.      Objective:  BP 138/78 mmHg  Pulse 82  Temp(Src) 98.1 F (36.7 C) (Oral)  Resp 16  Ht  (1.6 m)  Wt 173 lb (78.472 kg)  BMI 30.65 kg/m2  SpO2 99%  Physical Exam   General appearance: alert, cooperative and appears stated age Head: Normocephalic, without obvious abnormality, atraumatic Eyes: conjunctivae/corneas  clear. PERRL, EOM's intact. Fundi benign. Ears: normal TM's and external ear canals both ears Nose: Nares normal. Septum midline. Mucosa normal. No drainage or sinus tenderness. Throat: lips, mucosa, and tongue normal; teeth and gums normal Neck: no adenopathy, no carotid bruit, no JVD, supple, symmetrical, trachea midline and thyroid not enlarged, symmetric, no tenderness/mass/nodules Lungs: clear to auscultation bilaterally Breasts: normal appearance, no masses or tenderness Heart: regular rate and rhythm, S1, S2 normal, no murmur, click, rub or gallop Abdomen: soft, non-tender; bowel sounds normal; no masses,  no organomegaly Extremities: extremities normal, atraumatic, no cyanosis or edema Pulses: 2+ and symmetric Skin: Skin color, texture, turgor normal. No rashes or lesions Neurologic: Alert and oriented X 3, normal strength and tone. Normal symmetric reflexes. Normal coordination and gait.     Assessment & Plan:   Problem List Items Addressed This Visit      Unprioritized   Hyperlipidemia    Based on current lipid profile, the risk of clinically significant CAD is 9% over the next 10 years, using the Framingham risk calculator. The [employer] of [employer] recommends starting patients aged 60 or higher on moderate intensity statin therapy for LDL between 70-189 and 10 yr risk of CAD > 7.5% ;  and high intensity therapy for anyone with LDL > 190.  Given the borderline elevation,  I recommend low cholesterol diet, regular exercise with goal wt loss of 10% of your current weight, and repeat assessment in 6 months.       Relevant Orders   Lipid panel (Completed)   LDL cholesterol, direct (Completed)   Hypertension    Well controlled on current regimen. Renal function stable, no changes today.  Lab Results  Component Value Date   CREATININE 0.85 11/08/2014   Lab Results  Component Value Date   NA 137 11/08/2014   K 4.1 11/08/2014   CL 99 11/08/2014   CO2 29 11/08/2014          Obesity (BMI 30-39.9)    I have addressed  BMI and recommended wt loss of 10% of body weigh over the next 6 months using a low glycemic index diet and regular exercise a minimum of 5 days per week.        Routine general medical examination at a health care facility - Primary    Annual wellness  exam was done as well as a comprehensive physical exam and management of acute and chronic conditions .  During the course of the visit the patient was educated and counseled about appropriate screening and preventive services including :  diabetes screening, lipid analysis with projected  10 year  risk for CAD , nutrition counseling, colorectal cancer screening, and recommended immunizations.  Printed recommendations for health maintenance screenings was given.       Esophageal pain    With dysphagia, history of stricture and GERD,  DG esophagus ordered to evaluate for stricture. GI referral to follow      Relevant Orders   DG Esophagus    Other Visit Diagnoses    Dysphagia        Relevant Orders    DG Esophagus    Breast cancer screening        Relevant Orders    MM DIGITAL SCREENING BILATERAL    Obesity        Relevant Orders    Comprehensive metabolic panel (Completed)    TSH (Completed)    Need for hepatitis C screening test        Relevant Orders    Hepatitis C antibody (Completed)       I am having Ms. Aguilera maintain her fexofenadine, meloxicam, fluticasone, omeprazole, SUMAtriptan, metoprolol succinate, ALPRAZolam, methylPREDNISolone, and triamterene-hydrochlorothiazide.  No orders of the defined types were placed in this encounter.    There are no discontinued medications.  Follow-up: No Follow-up on file.   Sherlene ShamsULLO, Rocio Roam L, MD

## 2014-11-08 NOTE — Progress Notes (Signed)
Pre-visit discussion using our clinic review tool. No additional management support is needed unless otherwise documented below in the visit note.  

## 2014-11-09 LAB — HEPATITIS C ANTIBODY: HCV Ab: NEGATIVE

## 2014-11-10 NOTE — Assessment & Plan Note (Signed)
I have addressed  BMI and recommended wt loss of 10% of body weigh over the next 6 months using a low glycemic index diet and regular exercise a minimum of 5 days per week.   

## 2014-11-10 NOTE — Assessment & Plan Note (Signed)
With dysphagia, history of stricture and GERD,  DG esophagus ordered to evaluate for stricture. GI referral to follow

## 2014-11-10 NOTE — Assessment & Plan Note (Signed)

## 2014-11-10 NOTE — Assessment & Plan Note (Signed)
Well controlled on current regimen. Renal function stable, no changes today.  Lab Results  Component Value Date   CREATININE 0.85 11/08/2014   Lab Results  Component Value Date   NA 137 11/08/2014   K 4.1 11/08/2014   CL 99 11/08/2014   CO2 29 11/08/2014

## 2014-11-11 DIAGNOSIS — E785 Hyperlipidemia, unspecified: Secondary | ICD-10-CM | POA: Insufficient documentation

## 2014-11-11 DIAGNOSIS — E781 Pure hyperglyceridemia: Secondary | ICD-10-CM | POA: Insufficient documentation

## 2014-11-11 NOTE — Assessment & Plan Note (Signed)
Based on current lipid profile, the risk of clinically significant CAD is 9% over the next 10 years, using the Framingham risk calculator. The [employer] recommends starting patients aged 54 or higher on moderate intensity statin therapy for LDL between 70-189 and 10 yr risk of CAD > 7.5% ;  and high intensity therapy for anyone with LDL > 190.   Given the borderline elevation,  I recommend low cholesterol diet, regular exercise with goal wt loss of 10% of your current weight, and repeat assessment in 6 months.

## 2014-11-11 NOTE — Addendum Note (Signed)
Addended by: Sherlene ShamsULLO, Yasmina Chico L on: 11/11/2014 11:44 AM   Modules accepted: Kipp BroodSmartSet

## 2014-11-26 ENCOUNTER — Ambulatory Visit: Payer: BLUE CROSS/BLUE SHIELD | Admitting: Podiatry

## 2014-12-04 ENCOUNTER — Other Ambulatory Visit: Payer: Self-pay | Admitting: Internal Medicine

## 2014-12-04 ENCOUNTER — Ambulatory Visit
Admission: RE | Admit: 2014-12-04 | Discharge: 2014-12-04 | Disposition: A | Discharge status: Home / Self Care | Payer: BLUE CROSS/BLUE SHIELD | Source: Ambulatory Visit | Attending: Internal Medicine | Admitting: Internal Medicine

## 2014-12-04 DIAGNOSIS — K449 Diaphragmatic hernia without obstruction or gangrene: Secondary | ICD-10-CM | POA: Diagnosis not present

## 2014-12-04 DIAGNOSIS — K2289 Other specified disease of esophagus: Secondary | ICD-10-CM

## 2014-12-04 DIAGNOSIS — K228 Other specified diseases of esophagus: Secondary | ICD-10-CM

## 2014-12-04 DIAGNOSIS — M5030 Other cervical disc degeneration, unspecified cervical region: Secondary | ICD-10-CM | POA: Insufficient documentation

## 2014-12-04 DIAGNOSIS — R1314 Dysphagia, pharyngoesophageal phase: Secondary | ICD-10-CM

## 2014-12-04 DIAGNOSIS — R1310 Dysphagia, unspecified: Secondary | ICD-10-CM

## 2014-12-10 ENCOUNTER — Encounter: Payer: Self-pay | Admitting: Internal Medicine

## 2015-01-04 ENCOUNTER — Other Ambulatory Visit: Payer: Self-pay | Admitting: Internal Medicine

## 2015-01-11 ENCOUNTER — Other Ambulatory Visit: Payer: Self-pay | Admitting: Internal Medicine

## 2015-03-14 ENCOUNTER — Encounter: Payer: Self-pay | Admitting: *Deleted

## 2015-03-15 ENCOUNTER — Ambulatory Visit: Payer: BLUE CROSS/BLUE SHIELD | Admitting: Anesthesiology

## 2015-03-15 ENCOUNTER — Encounter
Admission: RE | Disposition: A | Discharge status: Home / Self Care | Payer: Self-pay | Source: Ambulatory Visit | Attending: Gastroenterology

## 2015-03-15 ENCOUNTER — Ambulatory Visit
Admission: RE | Admit: 2015-03-15 | Discharge: 2015-03-15 | Disposition: A | Discharge status: Home / Self Care | Payer: BLUE CROSS/BLUE SHIELD | Source: Ambulatory Visit | Attending: Gastroenterology | Admitting: Gastroenterology

## 2015-03-15 ENCOUNTER — Encounter: Payer: Self-pay | Admitting: *Deleted

## 2015-03-15 DIAGNOSIS — Z79899 Other long term (current) drug therapy: Secondary | ICD-10-CM | POA: Insufficient documentation

## 2015-03-15 DIAGNOSIS — R1013 Epigastric pain: Secondary | ICD-10-CM | POA: Diagnosis not present

## 2015-03-15 DIAGNOSIS — K219 Gastro-esophageal reflux disease without esophagitis: Secondary | ICD-10-CM | POA: Diagnosis not present

## 2015-03-15 DIAGNOSIS — Q438 Other specified congenital malformations of intestine: Secondary | ICD-10-CM | POA: Insufficient documentation

## 2015-03-15 DIAGNOSIS — Z1211 Encounter for screening for malignant neoplasm of colon: Secondary | ICD-10-CM | POA: Insufficient documentation

## 2015-03-15 DIAGNOSIS — K317 Polyp of stomach and duodenum: Secondary | ICD-10-CM | POA: Diagnosis not present

## 2015-03-15 DIAGNOSIS — K641 Second degree hemorrhoids: Secondary | ICD-10-CM | POA: Insufficient documentation

## 2015-03-15 DIAGNOSIS — R1310 Dysphagia, unspecified: Secondary | ICD-10-CM | POA: Insufficient documentation

## 2015-03-15 DIAGNOSIS — K2950 Unspecified chronic gastritis without bleeding: Secondary | ICD-10-CM | POA: Diagnosis not present

## 2015-03-15 DIAGNOSIS — I10 Essential (primary) hypertension: Secondary | ICD-10-CM | POA: Diagnosis not present

## 2015-03-15 DIAGNOSIS — Z9851 Tubal ligation status: Secondary | ICD-10-CM | POA: Insufficient documentation

## 2015-03-15 DIAGNOSIS — K644 Residual hemorrhoidal skin tags: Secondary | ICD-10-CM | POA: Insufficient documentation

## 2015-03-15 DIAGNOSIS — Z7982 Long term (current) use of aspirin: Secondary | ICD-10-CM | POA: Diagnosis not present

## 2015-03-15 DIAGNOSIS — K5730 Diverticulosis of large intestine without perforation or abscess without bleeding: Secondary | ICD-10-CM | POA: Diagnosis not present

## 2015-03-15 DIAGNOSIS — F418 Other specified anxiety disorders: Secondary | ICD-10-CM | POA: Diagnosis not present

## 2015-03-15 HISTORY — DX: Hyperlipidemia, unspecified: E78.5

## 2015-03-15 HISTORY — PX: ESOPHAGOGASTRODUODENOSCOPY (EGD) WITH PROPOFOL: SHX5813

## 2015-03-15 HISTORY — PX: COLONOSCOPY WITH PROPOFOL: SHX5780

## 2015-03-15 LAB — HM COLONOSCOPY

## 2015-03-15 SURGERY — COLONOSCOPY WITH PROPOFOL
Anesthesia: General

## 2015-03-15 MED ORDER — PROPOFOL 500 MG/50ML IV EMUL
INTRAVENOUS | Status: DC | PRN
  Administered 2015-03-15: 120 ug/kg/min via INTRAVENOUS

## 2015-03-15 MED ORDER — SODIUM CHLORIDE 0.9 % IV SOLN
INTRAVENOUS | Status: DC
  Administered 2015-03-15 (×2): via INTRAVENOUS

## 2015-03-15 MED ORDER — SODIUM CHLORIDE 0.9 % IV SOLN: INTRAVENOUS | Status: DC

## 2015-03-15 MED ORDER — PROPOFOL 10 MG/ML IV BOLUS
INTRAVENOUS | Status: DC | PRN
  Administered 2015-03-15: 100 mg via INTRAVENOUS

## 2015-03-15 NOTE — Anesthesia Preprocedure Evaluation (Signed)
Anesthesia Evaluation  Patient identified by MRN, date of birth, ID band Patient awake    Reviewed: Allergy & Precautions, NPO status   Airway Mallampati: II       Dental no notable dental hx.    Pulmonary neg pulmonary ROS,    Pulmonary exam normal breath sounds clear to auscultation       Cardiovascular hypertension, Pt. on home beta blockers Normal cardiovascular exam     Neuro/Psych    GI/Hepatic GERD  ,  Endo/Other  negative endocrine ROS  Renal/GU negative Renal ROS     Musculoskeletal negative musculoskeletal ROS (+)   Abdominal Normal abdominal exam  (+)   Peds negative pediatric ROS (+)  Hematology negative hematology ROS (+)   Anesthesia Other Findings   Reproductive/Obstetrics negative OB ROS                             Anesthesia Physical Anesthesia Plan  ASA: II  Anesthesia Plan: General   Post-op Pain Management:    Induction: Intravenous  Airway Management Planned: Nasal Cannula  Additional Equipment:   Intra-op Plan:   Post-operative Plan:   Informed Consent: I have reviewed the patients History and Physical, chart, labs and discussed the procedure including the risks, benefits and alternatives for the proposed anesthesia with the patient or authorized representative who has indicated his/her understanding and acceptance.     Plan Discussed with: CRNA  Anesthesia Plan Comments:         Anesthesia Quick Evaluation

## 2015-03-15 NOTE — Anesthesia Postprocedure Evaluation (Signed)
  Anesthesia Post-op Note  Patient: Tereso NewcomerCynthia A Aydin  Procedure(s) Performed: Procedure(s): COLONOSCOPY WITH PROPOFOL (N/A) ESOPHAGOGASTRODUODENOSCOPY (EGD) WITH PROPOFOL (N/A)  Anesthesia type:General  Patient location: PACU  Post pain: Pain level controlled  Post assessment: Post-op Vital signs reviewed, Patient's Cardiovascular Status Stable, Respiratory Function Stable, Patent Airway and No signs of Nausea or vomiting  Post vital signs: Reviewed and stable  Last Vitals:  Filed Vitals:   03/15/15 1108  BP: 114/68  Pulse: 92  Temp: 36.4 C  Resp: 11    Level of consciousness: awake, alert  and patient cooperative  Complications: No apparent anesthesia complications

## 2015-03-15 NOTE — H&P (Signed)
Outpatient short stay form Pre-procedure 03/15/2015 10:10 AM Vanessa DeemMartin U Skulskie MD  Primary Physician: Dr. Duncan Dulleresa Tullo  Reason for visit:  EGD and colonoscopy  History of present illness:  Patient is a 54 year old female presenting today for EGD and colonoscopy. Colonoscopy is for colon cancer screening purposes. However she is also had some difficulty with dysphagia mostly cervical while some dyspepsia. She had a barium swallow that indicated a prominent cricopharyngeus muscle as well as a small hiatal hernia.    Current facility-administered medications:  .  0.9 %  sodium chloride infusion, , Intravenous, Continuous, Vanessa DeemMartin U Skulskie, MD, Last Rate: 20 mL/hr at 03/15/15 0943 .  0.9 %  sodium chloride infusion, , Intravenous, Continuous, Vanessa DeemMartin U Skulskie, MD  Prescriptions prior to admission  Medication Sig Dispense Refill Last Dose  . ALPRAZolam (XANAX) 0.5 MG tablet TAKE ONE TABLET BY MOUTH AT BEDTIME AS NEEDED 30 tablet 5 Taking  . fexofenadine (ALLEGRA) 180 MG tablet Take 180 mg by mouth daily as needed.    Taking  . fluticasone (FLONASE) 50 MCG/ACT nasal spray Place 2 sprays into both nostrils daily. (Patient not taking: Reported on 05/07/2014) 16 g 6 Not Taking  . meloxicam (MOBIC) 15 MG tablet Take 15 mg by mouth daily.   Not Taking  . methylPREDNISolone (MEDROL DOSEPAK) 4 MG TBPK tablet Tapering 6 day dos (Patient not taking: Reported on 11/08/2014) 21 tablet 0 Not Taking  . metoprolol succinate (TOPROL-XL) 50 MG 24 hr tablet TAKE ONE TABLET BY MOUTH EACH DAY FOR BLOOD PRESSURE TAKE WITH A MEAL OR IMMEDIATELY AFTER 30 tablet 6   . omeprazole (PRILOSEC) 20 MG capsule TAKE ONE CAPSULE BY MOUTH DAILY 30 capsule 6   . SUMAtriptan (IMITREX) 25 MG tablet TAKE 1 TABLET AT ONSET OF HEADACHE AND ONE EVERY 2 HOURS AS NEEDED UNTIL RELIEVED  NOT TO EXCEED 8 IN 24 HOURS 9 tablet 6 Taking  . triamterene-hydrochlorothiazide (DYAZIDE) 37.5-25 MG per capsule TAKE ONE CAPSULE EACH MORNING 30 capsule 5  Taking     No Known Allergies   Past Medical History  Diagnosis Date  . GERD (gastroesophageal reflux disease)   . Hypertension   . Migraine syndrome   . Hyperlipidemia     Review of systems:      Physical Exam    Heart and lungs: rrr    HEENT: ncat    Other:    Pertinant exam for procedure: soft nt/nd bs+/n    Planned proceedures: egd and colonoscopy with indicated proceedures.  I have discussed the risks benefits and complications of procedures to include not limited to bleeding, infection, perforation and the risk of sedation and the patient wishes to proceed.    Vanessa DeemMartin U Skulskie, MD Gastroenterology 03/15/2015  10:10 AM

## 2015-03-15 NOTE — Op Note (Signed)
[facility] Gastroenterology Patient Name: Vanessa Richards Procedure Date: 03/15/2015 10:17 AM MRN: [id number] Account #: [id number] Date of Birth: [birth year] Admit Type: Outpatient Age: 54 Room: [facility] ENDO ROOM 3 Gender: Female Note Status: Finalized Procedure:         Upper GI endoscopy Indications:       Dyspepsia, Dysphagia Providers:         Christena Deem, MD Referring MD:      Duncan Dull, MD (Referring MD) Medicines:         Monitored Anesthesia Care Complications:     No immediate complications. Procedure:         Pre-Anesthesia Assessment:                    - ASA Grade Assessment: II - A patient with mild systemic                     disease.                    After obtaining informed consent, the endoscope was passed                     under direct vision. Throughout the procedure, the                     patient's blood pressure, pulse, and oxygen saturations                     were monitored continuously. The Endoscope was introduced                     through the mouth, and advanced to the third part of                     duodenum. The patient tolerated the procedure well. Findings:      The examined esophagus was normal.      Biopsies were taken with a cold forceps at the gastroesophageal junction       for histology.      No evidence of fixed stricture or stenosis.      Multiple 2 to 7 mm pedunculated and sessile polyps with no bleeding and       no stigmata of recent bleeding were found in the gastric body. Biopsies       were taken with a cold forceps for histology.      The cardia and gastric fundus were normal on retroflexion.      The examined duodenum was normal.      Biopsies were taken with a cold forceps in the gastric body and in the       gastric antrum for histology. Impression:        - Normal esophagus.                    - Multiple gastric polyps. Biopsied.                    - Normal examined duodenum.       - Biopsies were taken with a cold forceps for histology at                     the gastroesophageal junction.                    - Biopsies  were taken with a cold forceps for histology in                     the gastric body and in the gastric antrum. Recommendation:    - Continue present medications.                    - Return to GI clinic in 6 weeks. Procedure Code(s): --- Professional ---                    [phone], Esophagogastroduodenoscopy, flexible, transoral;                     with biopsy, single or multiple Diagnosis Code(s): --- Professional ---                    K31.7, Polyp of stomach and duodenum                    K30, Functional dyspepsia                    R13.10, Dysphagia, unspecified CPT copyright 2014 American Medical Association. All rights reserved. The codes documented in this report are preliminary and upon coder review may  be revised to meet current compliance requirements. Christena DeemMartin U Skulskie, MD 03/15/2015 10:39:05 AM This report has been signed electronically. Number of Addenda: 0 Note Initiated On: 03/15/2015 10:17 AM      [facility]

## 2015-03-15 NOTE — Op Note (Signed)
[facility] Gastroenterology Patient Name: Vanessa Richards Procedure Date: 03/15/2015 10:17 AM MRN: [id number] Account #: [id number] Date of Birth: 02-11-1961 Admit Type: Outpatient Age: 54 Room: [facility] ENDO ROOM 3 Gender: Female Note Status: Finalized Procedure:         Colonoscopy Indications:       Screening for colorectal malignant neoplasm, This is the                     patient's first colonoscopy Providers:         Christena Deem, MD Referring MD:      Duncan Dull, MD (Referring MD) Medicines:         Monitored Anesthesia Care Complications:     No immediate complications. Procedure:         Pre-Anesthesia Assessment:                    - ASA Grade Assessment: II - A patient with mild systemic                     disease.                    After obtaining informed consent, the colonoscope was                     passed under direct vision. Throughout the procedure, the                     patient's blood pressure, pulse, and oxygen saturations                     were monitored continuously. The Colonoscope was                     introduced through the anus and advanced to the the cecum,                     identified by appendiceal orifice and ileocecal valve. The                     colonoscopy was performed with moderate difficulty due to                     significant looping and a tortuous colon. Successful                     completion of the procedure was aided by changing the                     patient to a supine position, changing the patient to a                     prone position and using manual pressure. The patient                     tolerated the procedure well. The quality of the bowel                     preparation was good. Findings:      The sigmoid colon, descending colon and transverse colon were       significantly redundant.      Multiple small and large-mouthed diverticula were found in the sigmoid       colon and in  the descending colon.      The retroflexed view of the distal rectum and anal verge was normal and       showed no anal or rectal abnormalities.      Non-bleeding external hemorrhoids were found during perianal exam. The       hemorrhoids were Grade II (internal hemorrhoids that prolapse but reduce       spontaneously). Impression:        - Redundant colon.                    - Diverticulosis in the sigmoid colon and in the                     descending colon.                    - Non-bleeding external hemorrhoids.                    - No specimens collected. Recommendation:    - Discharge patient to home.                    - Use Analpram HC Cream 2.5%: Apply externally TID for 10                     days.                    - Repeat colonoscopy in 10 years for screening purposes. Procedure Code(s): --- Professional ---                    [phone], Colonoscopy, flexible; diagnostic, including                     collection of specimen(s) by brushing or washing, when                     performed (separate procedure) Diagnosis Code(s): --- Professional ---                    Z12.11, Encounter for screening for malignant neoplasm of                     colon                    K64.1, Second degree hemorrhoids                    K64.4, Residual hemorrhoidal skin tags                    K57.30, Diverticulosis of large intestine without                     perforation or abscess without bleeding                    Q43.8, Other specified congenital malformations of                     intestine CPT copyright 2014 American Medical Association. All rights reserved. The codes documented in this report are preliminary and upon coder review may  be revised to meet current compliance requirements. Christena Deem, MD 03/15/2015 11:06:18 AM This report has been signed electronically. Number of Addenda: 0 Note Initiated On: 03/15/2015 10:17 AM Scope Withdrawal Time: 0 hours 5  minutes 16  seconds  Total Procedure Duration: 0 hours 19 minutes 35 seconds       [facility]

## 2015-03-15 NOTE — Transfer of Care (Signed)
Immediate Anesthesia Transfer of Care Note  Patient: Vanessa NewcomerCynthia A Richards  Procedure(s) Performed: Procedure(s): COLONOSCOPY WITH PROPOFOL (N/A) ESOPHAGOGASTRODUODENOSCOPY (EGD) WITH PROPOFOL (N/A)  Patient Location: PACU  Anesthesia Type:General  Level of Consciousness: awake, alert  and oriented  Airway & Oxygen Therapy: Patient Spontanous Breathing and Patient connected to nasal cannula oxygen  Post-op Assessment: Report given to RN  Post vital signs: Reviewed  Last Vitals:  Filed Vitals:   03/15/15 0919  BP: 133/90  Pulse: 85  Temp: 37.2 C  Resp: 14    Complications: No apparent anesthesia complications

## 2015-03-18 LAB — SURGICAL PATHOLOGY

## 2015-03-20 ENCOUNTER — Encounter: Payer: Self-pay | Admitting: Gastroenterology

## 2015-03-22 ENCOUNTER — Ambulatory Visit (INDEPENDENT_AMBULATORY_CARE_PROVIDER_SITE_OTHER): Payer: BLUE CROSS/BLUE SHIELD | Admitting: Family Medicine

## 2015-03-22 ENCOUNTER — Encounter: Payer: Self-pay | Admitting: Family Medicine

## 2015-03-22 VITALS — BP 110/72 | HR 86 | Temp 98.6°F | Ht 63.0 in | Wt 172.1 lb

## 2015-03-22 DIAGNOSIS — N3001 Acute cystitis with hematuria: Secondary | ICD-10-CM | POA: Diagnosis not present

## 2015-03-22 DIAGNOSIS — N390 Urinary tract infection, site not specified: Secondary | ICD-10-CM | POA: Insufficient documentation

## 2015-03-22 LAB — POCT URINALYSIS DIPSTICK
Bilirubin, UA: NEGATIVE
Glucose, UA: NEGATIVE
Ketones, UA: NEGATIVE
Nitrite, UA: NEGATIVE
Protein, UA: NEGATIVE
Spec Grav, UA: 1.01
Urobilinogen, UA: 0.2
pH, UA: 6.5

## 2015-03-22 MED ORDER — CEPHALEXIN 500 MG PO CAPS: 500.0000 mg | ORAL_CAPSULE | Freq: Two times a day (BID) | ORAL | Status: DC

## 2015-03-22 NOTE — Progress Notes (Signed)
   Subjective:  Patient ID: Tereso Newcomerynthia A Litt, female    DOB: [birth year]  Age: 54 y.o. MRN: [id number]  CC: ? UTI  HPI:  54 year old female presents to clinic today with complaints of lower abdominal pain. Patient feels like she has UTI.  Patient reports that yesterday she began to have suprapubic abdominal pain which was worse with urination. No relieving factors. No associated fevers, chills, nausea, vomiting. No interventions tried. No gross hematuria. No flank pain.  Social Hx   Social History   Social History  . Marital Status: Married    Spouse Name: N/A  . Number of Children: N/A  . Years of Education: N/A   Social History Main Topics  . Smoking status: Never Smoker   . Smokeless tobacco: Never Used  . Alcohol Use: 1.2 oz/week    2 Glasses of wine per week  . Drug Use: No  . Sexual Activity: Not Asked   Other Topics Concern  . None   Social History Narrative   Review of Systems  Constitutional: Negative for fever and chills.  Gastrointestinal: Positive for abdominal pain. Negative for nausea and vomiting.  Genitourinary: Negative for urgency and frequency.       Questionable dysuria.    Objective:  BP 110/72 mmHg  Pulse 86  Temp(Src) 98.6 F (37 C) (Oral)  Ht 5\' 3"  (1.6 m)  Wt 172 lb 2 oz (78.075 kg)  BMI 30.50 kg/m2  SpO2 97%  BP/Weight 03/22/2015 03/15/2015 11/08/2014  Systolic BP 110 133 138  Diastolic BP 72 78 78  Wt. (Lbs) 172.13 170 173  BMI 30.5 30.12 30.65    Physical Exam  Constitutional: She appears well-developed. No distress.  Cardiovascular: Normal rate and regular rhythm.   Pulmonary/Chest: Effort normal and breath sounds normal.  Abdominal: Soft. She exhibits no distension. There is no rebound and no guarding.  Tender to palpation in the suprapubic region.  Neurological: She is alert.  Psychiatric: She has a normal mood and affect.  Vitals reviewed.  Lab Results  Component Value Date   GLUCOSE 91 11/08/2014   CHOL 220*  11/08/2014   TRIG 213.0* 11/08/2014   HDL 53.60 11/08/2014   LDLDIRECT 127.0 11/08/2014   LDLCALC 110* 11/14/2013   ALT 16 11/08/2014   AST 19 11/08/2014   NA 137 11/08/2014   K 4.1 11/08/2014   CL 99 11/08/2014   CREATININE 0.85 11/08/2014   BUN 14 11/08/2014   CO2 29 11/08/2014   TSH 2.31 11/08/2014    Assessment & Plan:   Problem List Items Addressed This Visit    UTI (urinary tract infection) - Primary    Patient with suprapubic pain and urinalysis with small leukocytes and small blood. Sending for culture. Treating with Keflex.      Relevant Medications   cephALEXin (KEFLEX) 500 MG capsule   Other Relevant Orders   POCT Urinalysis Dipstick (Completed)   Urine culture      Meds ordered this encounter  Medications  . cephALEXin (KEFLEX) 500 MG capsule    Sig: Take 1 capsule (500 mg total) by mouth 2 (two) times daily.    Dispense:  14 capsule    Refill:  0    Follow-up: PRN  Everlene OtherJayce Cheyann Blecha, DO

## 2015-03-22 NOTE — Progress Notes (Signed)
Pre visit review using our clinic review tool, if applicable. No additional management support is needed unless otherwise documented below in the visit note. 

## 2015-03-22 NOTE — Assessment & Plan Note (Signed)
Patient with suprapubic pain and urinalysis with small leukocytes and small blood. Sending for culture. Treating with Keflex.

## 2015-03-22 NOTE — Patient Instructions (Signed)
Take the medication (Keflex) as prescribed.  Please let us know if you worsen or fail to improve.  Take care  Dr. Adriana Simasook

## 2015-03-26 LAB — URINE CULTURE: Colony Count: >=100000

## 2015-03-30 LAB — HM COLONOSCOPY

## 2015-04-04 ENCOUNTER — Encounter: Payer: Self-pay | Admitting: Internal Medicine

## 2015-07-19 ENCOUNTER — Telehealth: Payer: Self-pay | Admitting: Internal Medicine

## 2015-07-19 MED ORDER — TRIAMTERENE-HCTZ 37.5-25 MG PO CAPS: ORAL_CAPSULE | ORAL | Status: DC

## 2015-07-19 NOTE — Telephone Encounter (Signed)
Pt was given a 30 day supply until an f/u appt is made

## 2015-07-19 NOTE — Telephone Encounter (Signed)
Pt called about needing a refill for triamterene-hydrochlorothiazide (DYAZIDE) 37.5-25 MG per capsule. Pharmacy is [facility] [place] [address] Call pt @ [phone]. Thank you!

## 2015-08-23 ENCOUNTER — Other Ambulatory Visit: Payer: Self-pay | Admitting: Surgical

## 2015-08-23 MED ORDER — TRIAMTERENE-HCTZ 37.5-25 MG PO CAPS: ORAL_CAPSULE | ORAL | Status: DC

## 2015-08-30 ENCOUNTER — Other Ambulatory Visit: Payer: Self-pay

## 2015-08-30 MED ORDER — ALPRAZOLAM 0.5 MG PO TABS: 0.5000 mg | ORAL_TABLET | Freq: Every evening | ORAL | Status: DC | PRN

## 2015-10-08 ENCOUNTER — Telehealth: Payer: Self-pay | Admitting: *Deleted

## 2015-10-08 MED ORDER — TRIAMTERENE-HCTZ 37.5-25 MG PO CAPS: ORAL_CAPSULE | ORAL | Status: DC

## 2015-10-08 NOTE — Telephone Encounter (Signed)
Patient has requested a medication refill for triamterene  Pharmacy [facility]

## 2015-10-08 NOTE — Telephone Encounter (Signed)
Gave enough refills till OV in July, thanks

## 2015-10-24 ENCOUNTER — Telehealth: Payer: Self-pay | Admitting: *Deleted

## 2015-10-24 MED ORDER — METOPROLOL SUCCINATE ER 50 MG PO TB24: ORAL_TABLET | ORAL | Status: DC

## 2015-10-24 NOTE — Telephone Encounter (Signed)
medication has been refilled. 

## 2015-10-24 NOTE — Telephone Encounter (Signed)
Patient has requested a medication refill for metoprolol [facility]

## 2015-11-19 ENCOUNTER — Encounter: Payer: Self-pay | Admitting: Internal Medicine

## 2015-11-19 ENCOUNTER — Other Ambulatory Visit (HOSPITAL_COMMUNITY)
Admission: RE | Admit: 2015-11-19 | Discharge: 2015-11-19 | Disposition: A | Discharge status: Home / Self Care | Payer: BLUE CROSS/BLUE SHIELD | Source: Ambulatory Visit | Attending: Internal Medicine | Admitting: Internal Medicine

## 2015-11-19 ENCOUNTER — Ambulatory Visit (INDEPENDENT_AMBULATORY_CARE_PROVIDER_SITE_OTHER): Payer: BLUE CROSS/BLUE SHIELD | Admitting: Internal Medicine

## 2015-11-19 VITALS — BP 122/74 | HR 87 | Temp 98.1°F | Resp 12 | Ht 63.0 in | Wt 173.8 lb

## 2015-11-19 DIAGNOSIS — Z01419 Encounter for gynecological examination (general) (routine) without abnormal findings: Secondary | ICD-10-CM | POA: Insufficient documentation

## 2015-11-19 DIAGNOSIS — R5383 Other fatigue: Secondary | ICD-10-CM

## 2015-11-19 DIAGNOSIS — I10 Essential (primary) hypertension: Secondary | ICD-10-CM

## 2015-11-19 DIAGNOSIS — Z0000 Encounter for general adult medical examination without abnormal findings: Secondary | ICD-10-CM

## 2015-11-19 DIAGNOSIS — E669 Obesity, unspecified: Secondary | ICD-10-CM

## 2015-11-19 DIAGNOSIS — Z124 Encounter for screening for malignant neoplasm of cervix: Secondary | ICD-10-CM

## 2015-11-19 DIAGNOSIS — Z1151 Encounter for screening for human papillomavirus (HPV): Secondary | ICD-10-CM | POA: Diagnosis not present

## 2015-11-19 DIAGNOSIS — E785 Hyperlipidemia, unspecified: Secondary | ICD-10-CM | POA: Diagnosis not present

## 2015-11-19 DIAGNOSIS — Z1239 Encounter for other screening for malignant neoplasm of breast: Secondary | ICD-10-CM | POA: Diagnosis not present

## 2015-11-19 LAB — COMPREHENSIVE METABOLIC PANEL
ALT: 15 U/L (ref 0–35)
AST: 22 U/L (ref 0–37)
Albumin: 4.1 g/dL (ref 3.5–5.2)
Alkaline Phosphatase: 86 U/L (ref 39–117)
BUN: 18 mg/dL (ref 6–23)
CO2: 29 mEq/L (ref 19–32)
Calcium: 9.3 mg/dL (ref 8.4–10.5)
Chloride: 103 mEq/L (ref 96–112)
Creatinine, Ser: 0.86 mg/dL (ref 0.40–1.20)
GFR: 72.88 mL/min (ref >60.00)
Glucose, Bld: 96 mg/dL (ref 70–99)
Potassium: 3.5 mEq/L (ref 3.5–5.1)
Sodium: 138 mEq/L (ref 135–145)
Total Bilirubin: 0.3 mg/dL (ref 0.2–1.2)
Total Protein: 7.4 g/dL (ref 6.0–8.3)

## 2015-11-19 LAB — LDL CHOLESTEROL, DIRECT: Direct LDL: 110 mg/dL

## 2015-11-19 LAB — LIPID PANEL
Cholesterol: 192 mg/dL (ref 0–200)
HDL: 48.3 mg/dL (ref >39.00)
NonHDL: 143.8
Total CHOL/HDL Ratio: 4
Triglycerides: 355 mg/dL — ABNORMAL HIGH (ref 0.0–149.0)
VLDL: 71 mg/dL — ABNORMAL HIGH (ref 0.0–40.0)

## 2015-11-19 LAB — TSH: TSH: 1.24 u[IU]/mL (ref 0.35–4.50)

## 2015-11-19 NOTE — Patient Instructions (Addendum)
  We discussed taking  a probiotic ( Align, Floraque or Culturelle), the generic version of one of these over the counter medications, or  Yogurt, or another dietary source)  To prevent vaginitis due to yeast infections and help digestive system .  If you want to curb your appetite,  Drink a glass of water with Metamucil or  Benefiber 30 mintues before meals,  It will fil you up!  Try Dream[place]'s pasta, ince a week ok.   2 Light beers per night is ok,    Find  substutions for potatoes,  Pretzels , crackers and rice Here are several:  Joseph's pital bread and Lavash bread Toufayan flate bread WASA crackers Mission whole wheat low carb tortilla'   To make a low carb chip :  Take the Joseph's Lavash or Pita bread,  Or the Mission Low carb whole wheat tortilla   Place on metal cookie sheet  Brush with olive oil  Sprinkle garlic powder (NOT garlic salt), grated parmesan cheese, mediterranean seasoning , or all of them?  Bake at 275 for 30 minutes   We have substitutions for your potatoes!!  Try the mashed cauliflower and riced cauliflower dishes instead of rice and mashed potatoes  Mashed turnips are also very low carb!   For desserts :  Try the Dannon Lt n Fit greek yogurt dessert flavors and top with reddi Whip .  8 carbs,  80 calories  Try Oikos Triple Zero Austria[place] Yogurt in the salted caramel, and the coffee flavors  With Whipped Cream for dessert  breyer's low carb ice cream, available in bars (on a stick, better ) or scoopable ice cream  HERE ARE THE LOW CARB  BREAD CHOICES

## 2015-11-19 NOTE — Progress Notes (Signed)
Pre-visit discussion using our clinic review tool. No additional management support is needed unless otherwise documented below in the visit note.  

## 2015-11-19 NOTE — Progress Notes (Signed)
Patient ID: Vanessa Richards, female    DOB: [birth year]  Age: 55 y.o. MRN: [id number]  The patient is here for annual physical  examination and management of other chronic and acute problems.   PAP due  Mammogram due    The risk factors are reflected in the social history.  The roster of all physicians providing medical care to patient - is listed in the Snapshot section of the chart. Home safety : The patient has smoke detectors in the home. They wear seatbelts.  There are no firearms at home. There is no violence in the home.   There is no risks for hepatitis, STDs or HIV. There is no   history of blood transfusion. They have no travel history to infectious disease endemic areas of the world.  The patient has seen their dentist in the last six month. They have seen their eye doctor in the last year.     They do not  have excessive sun exposure. Discussed the need for sun protection: hats, long sleeves and use of sunscreen if there is significant sun exposure.   Diet: the importance of a healthy diet is discussed. She overeats and overindulges in starches often,.   The benefits of regular aerobic exercise were discussed. She does not exercise regaularly.  Depression screen: there are no signs or vegative symptoms of depression- irritability, change in appetite, anhedonia, sadness/tearfullness.  The following portions of the patient's history were reviewed and updated as appropriate: allergies, current medications, past family history, past medical history,  past surgical history, past social history  and problem list.  Visual acuity was not assessed per patient preference since she has regular follow up with her ophthalmologist. Hearing and body mass index were assessed and reviewed.   During the course of the visit the patient was educated and counseled about appropriate screening and preventive services including : fall prevention , diabetes screening, nutrition counseling, colorectal  cancer screening, and recommended immunizations.    CC: The primary encounter diagnosis was Hyperlipidemia. Diagnoses of Other fatigue, Cervical cancer screening, Breast cancer screening, Encounter for preventive health examination, Obesity (BMI 30-39.9), and Essential hypertension were also pertinent to this visit.  History Vanessa Richards has a past medical history of GERD (gastroesophageal reflux disease); Hypertension; Migraine syndrome; and Hyperlipidemia.   She has past surgical history that includes Dilation and curettage of uterus; Tubal ligation; Colonoscopy with propofol (N/A, 03/15/2015); and Esophagogastroduodenoscopy (egd) with propofol (N/A, 03/15/2015).   Her family history includes Arthritis (age of onset: 66) in her sister; Early death in her paternal uncle; Heart disease (age of onset: 44) in her paternal grandfather; Heart disease (age of onset: 93) in her father; Hypertension in her father.She reports that she has never smoked. She has never used smokeless tobacco. She reports that she drinks about 1.2 oz of alcohol per week. She reports that she does not use illicit drugs.  Outpatient Prescriptions Prior to Visit  Medication Sig Dispense Refill  . ALPRAZolam (XANAX) 0.5 MG tablet Take 1 tablet (0.5 mg total) by mouth at bedtime as needed. 30 tablet 5  . fexofenadine (ALLEGRA) 180 MG tablet Take 180 mg by mouth daily as needed.     . fluticasone (FLONASE) 50 MCG/ACT nasal spray Place 2 sprays into both nostrils daily. 16 g 6  . meloxicam (MOBIC) 15 MG tablet Take 15 mg by mouth daily.    . metoprolol succinate (TOPROL-XL) 50 MG 24 hr tablet TAKE ONE TABLET BY MOUTH EACH DAY FOR BLOOD PRESSURE  TAKE WITH A MEAL OR IMMEDIATELY AFTER 30 tablet 0  . omeprazole (PRILOSEC) 20 MG capsule TAKE ONE CAPSULE BY MOUTH DAILY 30 capsule 6  . triamterene-hydrochlorothiazide (DYAZIDE) 37.5-25 MG capsule TAKE ONE CAPSULE EACH MORNING 30 capsule 1  . cephALEXin (KEFLEX) 500 MG capsule Take 1 capsule  (500 mg total) by mouth 2 (two) times daily. 14 capsule 0  . methylPREDNISolone (MEDROL DOSEPAK) 4 MG TBPK tablet Tapering 6 day dos 21 tablet 0  . SUMAtriptan (IMITREX) 25 MG tablet TAKE 1 TABLET AT ONSET OF HEADACHE AND ONE EVERY 2 HOURS AS NEEDED UNTIL RELIEVED  NOT TO EXCEED 8 IN 24 HOURS (Patient not taking: Reported on 11/19/2015) 9 tablet 6   No facility-administered medications prior to visit.    Review of Systems   Patient denies headache, fevers, malaise, unintentional weight loss, skin rash, eye pain, sinus congestion and sinus pain, sore throat, dysphagia,  hemoptysis , cough, dyspnea, wheezing, chest pain, palpitations, orthopnea, edema, abdominal pain, nausea, melena, diarrhea, constipation, flank pain, dysuria, hematuria, urinary  Frequency, nocturia, numbness, tingling, seizures,  Focal weakness, Loss of consciousness,  Tremor, insomnia, depression, anxiety, and suicidal ideation.     Objective:  BP 122/74 mmHg  Pulse 87  Temp(Src) 98.1 F (36.7 C) (Oral)  Resp 12  Ht 5\' 3"  (1.6 m)  Wt 173 lb 12 oz (78.812 kg)  BMI 30.79 kg/m2  SpO2 97%  Physical Exam  General Appearance:    Alert, cooperative, no distress, appears stated age  Head:    Normocephalic, without obvious abnormality, atraumatic  Eyes:    PERRL, conjunctiva/corneas clear, EOM's intact, fundi    benign, both eyes  Ears:    Normal TM's and external ear canals, both ears  Nose:   Nares normal, septum midline, mucosa normal, no drainage    or sinus tenderness  Throat:   Lips, mucosa, and tongue normal; teeth and gums normal  Neck:   Supple, symmetrical, trachea midline, no adenopathy;    thyroid:  no enlargement/tenderness/nodules; no carotid   bruit or JVD  Back:     Symmetric, no curvature, ROM normal, no CVA tenderness  Lungs:     Clear to auscultation bilaterally, respirations unlabored  Chest Wall:    No tenderness or deformity   Heart:    Regular rate and rhythm, S1 and S2 normal, no murmur, rub    or gallop  Breast Exam:    No tenderness, masses, or nipple abnormality  Abdomen:     Soft, non-tender, bowel sounds active all four quadrants,    no masses, no organomegaly  Genitalia:    Pelvic: cervix normal in appearance, external genitalia normal, no adnexal masses or tenderness, no cervical motion tenderness, rectovaginal septum normal, uterus normal size, shape, and consistency and vagina normal without discharge  Extremities:   Extremities normal, atraumatic, no cyanosis or edema  Pulses:   2+ and symmetric all extremities  Skin:   Skin color, texture, turgor normal, no rashes or lesions  Lymph nodes:   Cervical, supraclavicular, and axillary nodes normal  Neurologic:   CNII-XII intact, normal strength, sensation and reflexes    throughout      Assessment & Plan:   Problem List Items Addressed This Visit    Hypertension    Well controlled on current regimen. Renal function stable, no changes today.  Lab Results  Component Value Date   CREATININE 0.86 11/19/2015   Lab Results  Component Value Date   NA 138 11/19/2015   K  3.5 11/19/2015   CL 103 11/19/2015   CO2 29 11/19/2015           Obesity (BMI 30-39.9)    I have addressed  BMI and recommended wt loss of 10% of body weight over the next 6 months using a low fat, low starch, high protein  fruit/vegetable based Mediterranean diet and 30 minutes of aerobic exercise a minimum of 5 days per week.        Encounter for preventive health examination    Annual comprehensive preventive exam was done as well as an evaluation and management of chronic conditions .  During the course of the visit the patient was educated and counseled about appropriate screening and preventive services including :  diabetes screening, lipid analysis with projected  10 year  risk for CAD , nutrition counseling, breast, cervical and colorectal cancer screening, and recommended immunizations.  Printed recommendations for health maintenance  screenings was given      Hyperlipidemia - Primary    Triglycerides are elevated today to over 300.  Recommended a low glycemic index diet, weight  loss and regular exercise.  Advised t o  repeat in 3 months.   Lab Results  Component Value Date   CHOL 192 11/19/2015   HDL 48.30 11/19/2015   LDLCALC 110* 11/14/2013   LDLDIRECT 110.0 11/19/2015   TRIG 355.0* 11/19/2015   CHOLHDL 4 11/19/2015   Lab Results  Component Value Date   ALT 15 11/19/2015   AST 22 11/19/2015   ALKPHOS 86 11/19/2015   BILITOT 0.3 11/19/2015         Relevant Orders   Lipid panel (Completed)    Other Visit Diagnoses    Other fatigue        Relevant Orders    Comprehensive metabolic panel (Completed)    TSH (Completed)    Cervical cancer screening        Relevant Orders    Cytology - PAP    Breast cancer screening        Relevant Orders    MM DIGITAL SCREENING BILATERAL       I have discontinued Vanessa Richards's SUMAtriptan, methylPREDNISolone, and cephALEXin. I am also having her maintain her fexofenadine, meloxicam, fluticasone, omeprazole, ALPRAZolam, triamterene-hydrochlorothiazide, and metoprolol succinate.  No orders of the defined types were placed in this encounter.    Medications Discontinued During This Encounter  Medication Reason  . cephALEXin (KEFLEX) 500 MG capsule Completed Course  . methylPREDNISolone (MEDROL DOSEPAK) 4 MG TBPK tablet Completed Course  . methylPREDNISolone (MEDROL DOSEPAK) 4 MG TBPK tablet Completed Course  . SUMAtriptan (IMITREX) 25 MG tablet     Follow-up: No Follow-up on file.   Sherlene ShamsULLO, Eri Mcevers L, MD

## 2015-11-21 LAB — CYTOLOGY - PAP

## 2015-11-21 NOTE — Assessment & Plan Note (Signed)
Triglycerides are elevated today to over 300.  Recommended a low glycemic index diet, weight  loss and regular exercise.  Advised t o  repeat in 3 months.   Lab Results  Component Value Date   CHOL 192 11/19/2015   HDL 48.30 11/19/2015   LDLCALC 110* 11/14/2013   LDLDIRECT 110.0 11/19/2015   TRIG 355.0* 11/19/2015   CHOLHDL 4 11/19/2015   Lab Results  Component Value Date   ALT 15 11/19/2015   AST 22 11/19/2015   ALKPHOS 86 11/19/2015   BILITOT 0.3 11/19/2015

## 2015-11-21 NOTE — Assessment & Plan Note (Signed)
Annual comprehensive preventive exam was done as well as an evaluation and management of chronic conditions .  During the course of the visit the patient was educated and counseled about appropriate screening and preventive services including :  diabetes screening, lipid analysis with projected  10 year  risk for CAD , nutrition counseling, breast, cervical and colorectal cancer screening, and recommended immunizations.  Printed recommendations for health maintenance screenings was given 

## 2015-11-21 NOTE — Assessment & Plan Note (Signed)
Well controlled on current regimen. Renal function stable, no changes today.  Lab Results  Component Value Date   CREATININE 0.86 11/19/2015   Lab Results  Component Value Date   NA 138 11/19/2015   K 3.5 11/19/2015   CL 103 11/19/2015   CO2 29 11/19/2015

## 2015-11-21 NOTE — Assessment & Plan Note (Signed)
I have addressed  BMI and recommended wt loss of 10% of body weight over the next 6 months using a low fat, low starch, high protein  fruit/vegetable based Mediterranean diet and 30 minutes of aerobic exercise a minimum of 5 days per week.   

## 2015-11-27 ENCOUNTER — Other Ambulatory Visit: Payer: Self-pay | Admitting: Internal Medicine

## 2015-12-24 ENCOUNTER — Other Ambulatory Visit: Payer: Self-pay | Admitting: Internal Medicine

## 2015-12-24 MED ORDER — TRIAMTERENE-HCTZ 37.5-25 MG PO CAPS: ORAL_CAPSULE | ORAL | 1 refills | Status: DC

## 2016-01-25 ENCOUNTER — Other Ambulatory Visit: Payer: Self-pay | Admitting: Internal Medicine

## 2016-01-28 ENCOUNTER — Other Ambulatory Visit: Payer: BLUE CROSS/BLUE SHIELD

## 2016-01-31 ENCOUNTER — Telehealth: Payer: Self-pay

## 2016-01-31 DIAGNOSIS — R5383 Other fatigue: Secondary | ICD-10-CM

## 2016-01-31 DIAGNOSIS — I10 Essential (primary) hypertension: Secondary | ICD-10-CM

## 2016-01-31 DIAGNOSIS — E782 Mixed hyperlipidemia: Secondary | ICD-10-CM

## 2016-01-31 NOTE — Telephone Encounter (Signed)
Pt coming for fasting labs 02/03/16. Please place future orders. Thank you.  

## 2016-02-03 ENCOUNTER — Other Ambulatory Visit: Payer: BLUE CROSS/BLUE SHIELD

## 2016-02-03 NOTE — Telephone Encounter (Signed)
Ordered

## 2016-02-03 NOTE — Telephone Encounter (Signed)
Labs ordered.

## 2016-02-06 ENCOUNTER — Other Ambulatory Visit: Payer: Self-pay | Admitting: Internal Medicine

## 2016-02-07 NOTE — Telephone Encounter (Signed)
Last fill was 02/06/2016. Last seen 11/19/15.

## 2016-03-12 ENCOUNTER — Other Ambulatory Visit: Payer: Self-pay | Admitting: Internal Medicine

## 2016-03-12 NOTE — Telephone Encounter (Signed)
Last filled 02/06/16. Last OV 11/19/15. No follow up scheduled.

## 2016-05-29 ENCOUNTER — Other Ambulatory Visit: Payer: Self-pay | Admitting: Internal Medicine

## 2016-05-29 NOTE — Telephone Encounter (Signed)
Pt needs refill on Xanax and last refilled today. Pt has no scheduled appts and last OV was on 11/19/15. Pt last labs were ob 02/03/16.

## 2016-05-29 NOTE — Telephone Encounter (Signed)
Refill for 30 days only.  OFFICE VISIT NEEDED prior to any more refills  CALL PATIENT

## 2016-06-01 ENCOUNTER — Encounter: Payer: Self-pay | Admitting: Internal Medicine

## 2016-06-01 NOTE — Telephone Encounter (Signed)
Could you please call pt for OV for any more further refills on her Xanax. Pt has 30 day supply but needs to be seen for any more further refills. Thank you.

## 2016-06-01 NOTE — Telephone Encounter (Signed)
Could not get in touch with pt, sent a letter out to call office and schedule appt

## 2016-06-12 ENCOUNTER — Other Ambulatory Visit (INDEPENDENT_AMBULATORY_CARE_PROVIDER_SITE_OTHER): Payer: BLUE CROSS/BLUE SHIELD

## 2016-06-12 DIAGNOSIS — R5383 Other fatigue: Secondary | ICD-10-CM | POA: Diagnosis not present

## 2016-06-12 DIAGNOSIS — I10 Essential (primary) hypertension: Secondary | ICD-10-CM

## 2016-06-12 DIAGNOSIS — E782 Mixed hyperlipidemia: Secondary | ICD-10-CM

## 2016-06-12 LAB — LIPID PANEL
Cholesterol: 220 mg/dL — ABNORMAL HIGH (ref 0–200)
HDL: 51 mg/dL (ref >39.00)
LDL Cholesterol: 130 mg/dL — ABNORMAL HIGH (ref 0–99)
NonHDL: 169.05
Total CHOL/HDL Ratio: 4
Triglycerides: 195 mg/dL — ABNORMAL HIGH (ref 0.0–149.0)
VLDL: 39 mg/dL (ref 0.0–40.0)

## 2016-06-12 LAB — CBC WITH DIFFERENTIAL/PLATELET
Basophils Absolute: 0 10*3/uL (ref 0.0–0.1)
Basophils Relative: 0.5 % (ref 0.0–3.0)
Eosinophils Absolute: 0.4 10*3/uL (ref 0.0–0.7)
Eosinophils Relative: 4.8 % (ref 0.0–5.0)
HCT: 40.3 % (ref 36.0–46.0)
Hemoglobin: 13.5 g/dL (ref 12.0–15.0)
Lymphocytes Relative: 34.7 % (ref 12.0–46.0)
Lymphs Abs: 3 10*3/uL (ref 0.7–4.0)
MCHC: 33.6 g/dL (ref 30.0–36.0)
MCV: 88.8 fl (ref 78.0–100.0)
Monocytes Absolute: 0.5 10*3/uL (ref 0.1–1.0)
Monocytes Relative: 6.3 % (ref 3.0–12.0)
Neutro Abs: 4.6 10*3/uL (ref 1.4–7.7)
Neutrophils Relative %: 53.7 % (ref 43.0–77.0)
Platelets: 341 10*3/uL (ref 150.0–400.0)
RBC: 4.54 Mil/uL (ref 3.87–5.11)
RDW: 12.9 % (ref 11.5–15.5)
WBC: 8.5 10*3/uL (ref 4.0–10.5)

## 2016-06-12 LAB — COMPREHENSIVE METABOLIC PANEL
ALT: 17 U/L (ref 0–35)
AST: 18 U/L (ref 0–37)
Albumin: 4.5 g/dL (ref 3.5–5.2)
Alkaline Phosphatase: 83 U/L (ref 39–117)
BUN: 13 mg/dL (ref 6–23)
CO2: 31 mEq/L (ref 19–32)
Calcium: 9.5 mg/dL (ref 8.4–10.5)
Chloride: 101 mEq/L (ref 96–112)
Creatinine, Ser: 0.79 mg/dL (ref 0.40–1.20)
GFR: 80.22 mL/min (ref >60.00)
Glucose, Bld: 98 mg/dL (ref 70–99)
Potassium: 3.9 mEq/L (ref 3.5–5.1)
Sodium: 139 mEq/L (ref 135–145)
Total Bilirubin: 0.3 mg/dL (ref 0.2–1.2)
Total Protein: 7.4 g/dL (ref 6.0–8.3)

## 2016-06-12 LAB — TSH: TSH: 2.08 u[IU]/mL (ref 0.35–4.50)

## 2016-06-12 NOTE — Addendum Note (Signed)
Addended by: WIGGINS, Marieme Mcmackin N on: 06/12/2016 09:26 AM   Modules accepted: Orders  

## 2016-06-12 NOTE — Addendum Note (Signed)
Addended by: Penne LashWIGGINS, Ciaira Natividad N on: 06/12/2016 09:26 AM   Modules accepted: Orders

## 2016-06-29 ENCOUNTER — Other Ambulatory Visit: Payer: Self-pay | Admitting: Internal Medicine

## 2016-06-29 NOTE — Telephone Encounter (Signed)
Refilled on 05/29/2016. Last office visit 11/19/2015. Next office visit 11/25/2016. Please advise.

## 2016-06-29 NOTE — Telephone Encounter (Signed)
Refill denied.  6 month follow up required for refills on all benzodiazepines

## 2016-06-30 NOTE — Telephone Encounter (Signed)
Spoke with pt and she stated that she does not need any refills at this time. She stated that she still has plenty of what Dr. Darrick Huntsmanullo prescribed her the last time. The pt asked if we could call the pharmacy and have them to quit sending refill requests. Called the pharmacy and informed them that the pt would contact them when she needed any refills.

## 2016-07-01 ENCOUNTER — Other Ambulatory Visit: Payer: Self-pay | Admitting: Internal Medicine

## 2016-07-02 ENCOUNTER — Other Ambulatory Visit: Payer: Self-pay | Admitting: Internal Medicine

## 2016-07-03 ENCOUNTER — Other Ambulatory Visit: Payer: Self-pay | Admitting: Internal Medicine

## 2016-07-04 ENCOUNTER — Other Ambulatory Visit: Payer: Self-pay | Admitting: Internal Medicine

## 2016-07-06 NOTE — Telephone Encounter (Signed)
Last office visit 11/19/15 Next office visit 07/04/16

## 2016-07-06 NOTE — Telephone Encounter (Signed)
Refill denied,  DNKA'd?

## 2016-07-07 NOTE — Telephone Encounter (Signed)
Tried calling patient at home voicemail not setup. Will mail letter.

## 2016-07-07 NOTE — Telephone Encounter (Signed)
Pt called back returning your call. Pt stated that she is only taking the medication as needed and does not need a refill. She is going to call the pharmacy that she does not need this and to not send over any more requests.  Call pt @ [phone]

## 2016-08-04 ENCOUNTER — Other Ambulatory Visit: Payer: Self-pay | Admitting: Internal Medicine

## 2016-08-04 ENCOUNTER — Telehealth: Payer: Self-pay | Admitting: Internal Medicine

## 2016-08-04 NOTE — Telephone Encounter (Signed)
Alprazolam refill denied,  bc it is a controlled substance and it has been over 6 months since she has been seen.

## 2016-08-04 NOTE — Telephone Encounter (Signed)
Refilled: 05/29/2016 Last OV: 11/19/2015 Next OV: 11/25/2016

## 2016-08-06 ENCOUNTER — Other Ambulatory Visit: Payer: Self-pay

## 2016-08-06 ENCOUNTER — Other Ambulatory Visit: Payer: Self-pay | Admitting: Internal Medicine

## 2016-08-06 NOTE — Telephone Encounter (Signed)
Spoke with pt and she stated that she does not need any refills at this time. Per Dr. Darrick Huntsman d/c the medication until pt comes in for an office visit in July.

## 2016-10-28 ENCOUNTER — Other Ambulatory Visit: Payer: Self-pay

## 2016-10-28 MED ORDER — FLUTICASONE PROPIONATE 50 MCG/ACT NA SUSP: 2.0000 | Freq: Every day | NASAL | 2 refills | Status: DC

## 2016-11-19 ENCOUNTER — Other Ambulatory Visit: Payer: Self-pay

## 2016-11-19 MED ORDER — FLUTICASONE PROPIONATE 50 MCG/ACT NA SUSP: 2.0000 | Freq: Every day | NASAL | 2 refills | Status: DC

## 2016-11-25 ENCOUNTER — Encounter: Payer: Self-pay | Admitting: Internal Medicine

## 2016-11-25 ENCOUNTER — Ambulatory Visit (INDEPENDENT_AMBULATORY_CARE_PROVIDER_SITE_OTHER): Payer: BLUE CROSS/BLUE SHIELD | Admitting: Internal Medicine

## 2016-11-25 VITALS — BP 136/88 | HR 83 | Temp 98.1°F | Resp 15 | Ht 63.0 in | Wt 164.6 lb

## 2016-11-25 DIAGNOSIS — F411 Generalized anxiety disorder: Secondary | ICD-10-CM | POA: Diagnosis not present

## 2016-11-25 DIAGNOSIS — E669 Obesity, unspecified: Secondary | ICD-10-CM

## 2016-11-25 DIAGNOSIS — Z0000 Encounter for general adult medical examination without abnormal findings: Secondary | ICD-10-CM

## 2016-11-25 DIAGNOSIS — Z1231 Encounter for screening mammogram for malignant neoplasm of breast: Secondary | ICD-10-CM | POA: Diagnosis not present

## 2016-11-25 DIAGNOSIS — I10 Essential (primary) hypertension: Secondary | ICD-10-CM | POA: Diagnosis not present

## 2016-11-25 DIAGNOSIS — Z0001 Encounter for general adult medical examination with abnormal findings: Secondary | ICD-10-CM

## 2016-11-25 DIAGNOSIS — E7800 Pure hypercholesterolemia, unspecified: Secondary | ICD-10-CM | POA: Diagnosis not present

## 2016-11-25 DIAGNOSIS — Z1239 Encounter for other screening for malignant neoplasm of breast: Secondary | ICD-10-CM

## 2016-11-25 DIAGNOSIS — R5383 Other fatigue: Secondary | ICD-10-CM | POA: Diagnosis not present

## 2016-11-25 MED ORDER — FLUTICASONE PROPIONATE 50 MCG/ACT NA SUSP: 2.0000 | Freq: Every day | NASAL | 2 refills | Status: DC

## 2016-11-25 MED ORDER — TRIAMTERENE-HCTZ 37.5-25 MG PO CAPS: 1.0000 | ORAL_CAPSULE | Freq: Every morning | ORAL | 2 refills | Status: DC

## 2016-11-25 MED ORDER — ESCITALOPRAM OXALATE 5 MG PO TABS: 5.0000 mg | ORAL_TABLET | Freq: Every day | ORAL | 0 refills | Status: DC

## 2016-11-25 MED ORDER — FEXOFENADINE HCL 180 MG PO TABS: 180.0000 mg | ORAL_TABLET | Freq: Every day | ORAL | 2 refills | Status: DC | PRN

## 2016-11-25 MED ORDER — METOPROLOL SUCCINATE ER 50 MG PO TB24: ORAL_TABLET | ORAL | 2 refills | Status: DC

## 2016-11-25 NOTE — Progress Notes (Signed)
Patient ID: Vanessa Richards, female    DOB: 04-Feb-1961  Age: [age] y.o. MRN: [id number]  The patient is here for annual preventive examination and management of other chronic and acute problems. Last seen one year ago for CPE  EGD and colonoscopy in 2016 Mammogram 2016  Normal PAP 2017    The risk factors are reflected in the social history.  The roster of all physicians providing medical care to patient - is listed in the Snapshot section of the chart.  Activities of daily living:  The patient is 100% independent in all ADLs: dressing, toileting, feeding as well as independent mobility  Home safety : The patient has smoke detectors in the home. They wear seatbelts.  There are no firearms at home. There is no violence in the home.   There is no risks for hepatitis, STDs or HIV. There is no   history of blood transfusion. They have no travel history to infectious disease endemic areas of the world.  The patient has seen their dentist in the last six month. They have seen their eye doctor in the last year. They admit to slight hearing difficulty with regard to whispered voices and some television programs.  They have deferred audiologic testing in the last year.  They do not  have excessive sun exposure. Discussed the need for sun protection: hats, long sleeves and use of sunscreen if there is significant sun exposure.   Diet: the importance of a healthy diet is discussed. They do have a healthy diet.  The benefits of regular aerobic exercise were discussed. She walks 4 times per week ,  20 minutes.   Depression screen: there are several vegative symptoms of depression- irritability, change in appetite, anhedonia, EXCESSIVE FATIGUE (SEE cc).  The following portions of the patient's history were reviewed and updated as appropriate: allergies, current medications, past family history, past medical history,  past surgical history, past social history  and problem list.  Visual acuity was not  assessed per patient preference since she has regular follow up with her ophthalmologist. Hearing and body mass index were assessed and reviewed.   During the course of the visit the patient was educated and counseled about appropriate screening and preventive services including : fall prevention , diabetes screening, nutrition counseling, colorectal cancer screening, and recommended immunizations.    CC:  Fatigue:  Reports feeling Tired when she comes home from work,  Finds herself Getting irritated at work. Has increased responsibilities visiting her husband's  aunt  Who was recently relocated to a rest home.   Now her husband's mother is also declining in health and needing more care. Two other sons and their  wives do not help.  Patient working full time and feeling overburdened.     Intentional weight loss of 10 lbs using [employer].    History Aram BeechamCynthia has a past medical history of GERD (gastroesophageal reflux disease); Hyperlipidemia; Hypertension; and Migraine syndrome.   She has a past surgical history that includes Dilation and curettage of uterus; Tubal ligation; Colonoscopy with propofol (N/A, 03/15/2015); and Esophagogastroduodenoscopy (egd) with propofol (N/A, 03/15/2015).   Her family history includes Arthritis (age of onset: 3222) in her sister; Early death in her paternal uncle; Heart disease (age of onset: 7750) in her paternal grandfather; Heart disease (age of onset: 4070) in her father; Hypertension in her father.She reports that she has never smoked. She has never used smokeless tobacco. She reports that she drinks about 1.2 oz of alcohol per week .  She reports that she does not use drugs.  Outpatient Medications Prior to Visit  Medication Sig Dispense Refill  . omeprazole (PRILOSEC) 20 MG capsule TAKE ONE CAPSULE BY MOUTH DAILY 30 capsule 6  . fexofenadine (ALLEGRA) 180 MG tablet Take 180 mg by mouth daily as needed.     . fluticasone (FLONASE) 50 MCG/ACT nasal spray Place 2 sprays  into both nostrils daily. 16 g 2  . meloxicam (MOBIC) 15 MG tablet Take 15 mg by mouth daily.    . metoprolol succinate (TOPROL-XL) 50 MG 24 hr tablet TAKE ONE TABLET BY MOUTH EVERY DAY FOR BLOOD PRESSURE WITH MEALS 30 tablet 11  . triamterene-hydrochlorothiazide (DYAZIDE) 37.5-25 MG capsule TAKE ONE CAPSULE EACH MORNING 30 capsule 1  . triamterene-hydrochlorothiazide (DYAZIDE) 37.5-25 MG capsule TAKE ONE CAPSULE BY MOUTH EVERY MORNING 90 capsule 0   No facility-administered medications prior to visit.     Review of Systems   Patient denies headache, fevers, malaise, unintentional weight loss, skin rash, eye pain, sinus congestion and sinus pain, sore throat, dysphagia,  hemoptysis , cough, dyspnea, wheezing, chest pain, palpitations, orthopnea, edema, abdominal pain, nausea, melena, diarrhea, constipation, flank pain, dysuria, hematuria, urinary  Frequency, nocturia, numbness, tingling, seizures,  Focal weakness, Loss of consciousness,  Tremor, insomnia, , and suicidal ideation.      Objective:  BP 136/88 (BP Location: Left Arm, Patient Position: Sitting, Cuff Size: Normal)   Pulse 83   Temp 98.1 F (36.7 C) (Oral)   Resp 15   Ht 5\' 3"  (1.6 m)   Wt 164 lb 9.6 oz (74.7 kg)   SpO2 98%   BMI 29.16 kg/m   Physical Exam   General appearance: alert, cooperative and appears stated age Head: Normocephalic, without obvious abnormality, atraumatic Eyes: conjunctivae/corneas clear. PERRL, EOM's intact. Fundi benign. Ears: normal TM's and external ear canals both ears Nose: Nares normal. Septum midline. Mucosa normal. No drainage or sinus tenderness. Throat: lips, mucosa, and tongue normal; teeth and gums normal Neck: no adenopathy, no carotid bruit, no JVD, supple, symmetrical, trachea midline and thyroid not enlarged, symmetric, no tenderness/mass/nodules Lungs: clear to auscultation bilaterally Breasts: normal appearance, no masses or tenderness Heart: regular rate and rhythm, S1, S2  normal, no murmur, click, rub or gallop Abdomen: soft, non-tender; bowel sounds normal; no masses,  no organomegaly Extremities: extremities normal, atraumatic, no cyanosis or edema Pulses: 2+ and symmetric Skin: Skin color, texture, turgor normal. No rashes or lesions Neurologic: Alert and oriented X 3, normal strength and tone. Normal symmetric reflexes. Normal coordination and gait.   Psych: affect normal, makes good eye contact. No fidgeting,  Smiles easily.  Denies suicidal thoughts     Assessment & Plan:   Problem List Items Addressed This Visit    Encounter for preventive health examination    Annual comprehensive preventive exam was done as well as an evaluation and management of chronic conditions .  During the course of the visit the patient was educated and counseled about appropriate screening and preventive services including :  diabetes screening, lipid analysis with projected  10 year  risk for CAD , nutrition counseling, breast, cervical and colorectal cancer screening, and recommended immunizations.  Printed recommendations for health maintenance screenings was given.      Generalized anxiety disorder    Secondary to increased caregiver burden imposed on her by husband and his family.  Trial of lexapro recommended      Hyperlipidemia   Relevant Medications   metoprolol succinate (TOPROL-XL) 50 MG  24 hr tablet   triamterene-hydrochlorothiazide (DYAZIDE) 37.5-25 MG capsule   Other Relevant Orders   Lipid panel   Hypertension    she reports compliance with medication regimen  but has an elevated reading today in office.  He has been asked to check his BP at work and  submit readings for evaluation. Renal function will be checked today.  Lab Results  Component Value Date   CREATININE 0.79 06/12/2016         Relevant Medications   metoprolol succinate (TOPROL-XL) 50 MG 24 hr tablet   triamterene-hydrochlorothiazide (DYAZIDE) 37.5-25 MG capsule   Other Relevant  Orders   Comprehensive metabolic panel   Obesity (BMI 04-54.9)    I have congratulated her in reduction of   BMI and encouraged  Continued weight loss with goal of 10% of body weigh over the next 6 months using a low glycemic index diet and regular exercise a minimum of 5 days per week.         Other Visit Diagnoses    Breast cancer screening    -  Primary   Relevant Orders   MM SCREENING BREAST TOMO BILATERAL   Fatigue, unspecified type       Relevant Orders   CBC with Differential/Platelet      I have discontinued Ms. Lablanc's meloxicam and triamterene-hydrochlorothiazide. I have also changed her fexofenadine and triamterene-hydrochlorothiazide. Additionally, I am having her start on escitalopram. Lastly, I am having her maintain her omeprazole, ALPRAZolam, fluticasone, and metoprolol succinate.  Meds ordered this encounter  Medications  . ALPRAZolam (XANAX) 0.5 MG tablet    Sig: alprazolam 0.5 mg tablet  . escitalopram (LEXAPRO) 5 MG tablet    Sig: Take 1 tablet (5 mg total) by mouth daily.    Dispense:  30 tablet    Refill:  0  . fexofenadine (ALLEGRA) 180 MG tablet    Sig: Take 1 tablet (180 mg total) by mouth daily as needed.    Dispense:  90 tablet    Refill:  2  . fluticasone (FLONASE) 50 MCG/ACT nasal spray    Sig: Place 2 sprays into both nostrils daily.    Dispense:  16 g    Refill:  2  . metoprolol succinate (TOPROL-XL) 50 MG 24 hr tablet    Sig: TAKE ONE TABLET BY MOUTH EVERY DAY FOR BLOOD PRESSURE WITH MEALS    Dispense:  90 tablet    Refill:  2  . triamterene-hydrochlorothiazide (DYAZIDE) 37.5-25 MG capsule    Sig: Take 1 each (1 capsule total) by mouth every morning.    Dispense:  90 capsule    Refill:  2    This prescription was filled on 08/04/2016. Any refills authorized will be placed on file.    Medications Discontinued During This Encounter  Medication Reason  . triamterene-hydrochlorothiazide (DYAZIDE) 37.5-25 MG capsule   . meloxicam (MOBIC) 15  MG tablet   . fexofenadine (ALLEGRA) 180 MG tablet Reorder  . fluticasone (FLONASE) 50 MCG/ACT nasal spray Reorder  . metoprolol succinate (TOPROL-XL) 50 MG 24 hr tablet Reorder  . triamterene-hydrochlorothiazide (DYAZIDE) 37.5-25 MG capsule Reorder    Follow-up: No Follow-up on file.   Sherlene Shams, MD

## 2016-11-25 NOTE — Patient Instructions (Signed)
The new goals for optimal blood pressure management are 120/70.  Please check your blood pressure a few times at home and send me the readings so I can determine if you need a change in medication   Please start the Lexapro  daily in the evening for your anxiety.   If the lexapro interferes with your sleep, take it in the morning instead  Mammogram ordered  Return for fasting labs asap   Health Maintenance for Postmenopausal Women Menopause is a normal process in which your reproductive ability comes to an end. This process happens gradually over a span of months to years, usually between the ages of 72 and 40. Menopause is complete when you have missed 12 consecutive menstrual periods. It is important to talk with your health care provider about some of the most common conditions that affect postmenopausal women, such as heart disease, cancer, and bone loss (osteoporosis). Adopting a healthy lifestyle and getting preventive care can help to promote your health and wellness. Those actions can also lower your chances of developing some of these common conditions. What should I know about menopause? During menopause, you may experience a number of symptoms, such as:  Moderate-to-severe hot flashes.  Night sweats.  Decrease in sex drive.  Mood swings.  Headaches.  Tiredness.  Irritability.  Memory problems.  Insomnia.  Choosing to treat or not to treat menopausal changes is an individual decision that you make with your health care provider. What should I know about hormone replacement therapy and supplements? Hormone therapy products are effective for treating symptoms that are associated with menopause, such as hot flashes and night sweats. Hormone replacement carries certain risks, especially as you become older. If you are thinking about using estrogen or estrogen with progestin treatments, discuss the benefits and risks with your health care provider. What should I know  about heart disease and stroke? Heart disease, heart attack, and stroke become more likely as you age. This may be due, in part, to the hormonal changes that your body experiences during menopause. These can affect how your body processes dietary fats, triglycerides, and cholesterol. Heart attack and stroke are both medical emergencies. There are many things that you can do to help prevent heart disease and stroke:  Have your blood pressure checked at least every 1-2 years. High blood pressure causes heart disease and increases the risk of stroke.  If you are 34-34 years old, ask your health care provider if you should take aspirin to prevent a heart attack or a stroke.  Do not use any tobacco products, including cigarettes, chewing tobacco, or electronic cigarettes. If you need help quitting, ask your health care provider.  It is important to eat a healthy diet and maintain a healthy weight. ? Be sure to include plenty of vegetables, fruits, low-fat dairy products, and lean protein. ? Avoid eating foods that are high in solid fats, added sugars, or salt (sodium).  Get regular exercise. This is one of the most important things that you can do for your health. ? Try to exercise for at least 150 minutes each week. The type of exercise that you do should increase your heart rate and make you sweat. This is known as moderate-intensity exercise. ? Try to do strengthening exercises at least twice each week. Do these in addition to the moderate-intensity exercise.  Know your numbers.Ask your health care provider to check your cholesterol and your blood glucose. Continue to have your blood tested as directed by your health  care provider.  What should I know about cancer screening? There are several types of cancer. Take the following steps to reduce your risk and to catch any cancer development as early as possible. Breast Cancer  Practice breast self-awareness. ? This means understanding how your  breasts normally appear and feel. ? It also means doing regular breast self-exams. Let your health care provider know about any changes, no matter how small.  If you are 27 or older, have a clinician do a breast exam (clinical breast exam or CBE) every year. Depending on your age, family history, and medical history, it may be recommended that you also have a yearly breast X-ray (mammogram).  If you have a family history of breast cancer, talk with your health care provider about genetic screening.  If you are at high risk for breast cancer, talk with your health care provider about having an MRI and a mammogram every year.  Breast cancer (BRCA) gene test is recommended for women who have family members with BRCA-related cancers. Results of the assessment will determine the need for genetic counseling and BRCA1 and for BRCA2 testing. BRCA-related cancers include these types: ? Breast. This occurs in males or females. ? Ovarian. ? Tubal. This may also be called fallopian tube cancer. ? Cancer of the abdominal or pelvic lining (peritoneal cancer). ? Prostate. ? Pancreatic.  Cervical, Uterine, and Ovarian Cancer Your health care provider may recommend that you be screened regularly for cancer of the pelvic organs. These include your ovaries, uterus, and vagina. This screening involves a pelvic exam, which includes checking for microscopic changes to the surface of your cervix (Pap test).  For women ages 21-65, health care providers may recommend a pelvic exam and a Pap test every three years. For women ages 70-65, they may recommend the Pap test and pelvic exam, combined with testing for human papilloma virus (HPV), every five years. Some types of HPV increase your risk of cervical cancer. Testing for HPV may also be done on women of any age who have unclear Pap test results.  Other health care providers may not recommend any screening for nonpregnant women who are considered low risk for pelvic  cancer and have no symptoms. Ask your health care provider if a screening pelvic exam is right for you.  If you have had past treatment for cervical cancer or a condition that could lead to cancer, you need Pap tests and screening for cancer for at least 20 years after your treatment. If Pap tests have been discontinued for you, your risk factors (such as having a new sexual partner) need to be reassessed to determine if you should start having screenings again. Some women have medical problems that increase the chance of getting cervical cancer. In these cases, your health care provider may recommend that you have screening and Pap tests more often.  If you have a family history of uterine cancer or ovarian cancer, talk with your health care provider about genetic screening.  If you have vaginal bleeding after reaching menopause, tell your health care provider.  There are currently no reliable tests available to screen for ovarian cancer.  Lung Cancer Lung cancer screening is recommended for adults 52-68 years old who are at high risk for lung cancer because of a history of smoking. A yearly low-dose CT scan of the lungs is recommended if you:  Currently smoke.  Have a history of at least 30 pack-years of smoking and you currently smoke or have quit  within the past 15 years. A pack-year is smoking an average of one pack of cigarettes per day for one year.  Yearly screening should:  Continue until it has been 15 years since you quit.  Stop if you develop a health problem that would prevent you from having lung cancer treatment.  Colorectal Cancer  This type of cancer can be detected and can often be prevented.  Routine colorectal cancer screening usually begins at age 31 and continues through age 105.  If you have risk factors for colon cancer, your health care provider may recommend that you be screened at an earlier age.  If you have a family history of colorectal cancer, talk with  your health care provider about genetic screening.  Your health care provider may also recommend using home test kits to check for hidden blood in your stool.  A small camera at the end of a tube can be used to examine your colon directly (sigmoidoscopy or colonoscopy). This is done to check for the earliest forms of colorectal cancer.  Direct examination of the colon should be repeated every 5-10 years until age 23. However, if early forms of precancerous polyps or small growths are found or if you have a family history or genetic risk for colorectal cancer, you may need to be screened more often.  Skin Cancer  Check your skin from head to toe regularly.  Monitor any moles. Be sure to tell your health care provider: ? About any new moles or changes in moles, especially if there is a change in a mole's shape or color. ? If you have a mole that is larger than the size of a pencil eraser.  If any of your family members has a history of skin cancer, especially at a young age, talk with your health care provider about genetic screening.  Always use sunscreen. Apply sunscreen liberally and repeatedly throughout the day.  Whenever you are outside, protect yourself by wearing long sleeves, pants, a wide-brimmed hat, and sunglasses.  What should I know about osteoporosis? Osteoporosis is a condition in which bone destruction happens more quickly than new bone creation. After menopause, you may be at an increased risk for osteoporosis. To help prevent osteoporosis or the bone fractures that can happen because of osteoporosis, the following is recommended:  If you are 44-89 years old, get at least 1,000 mg of calcium and at least 600 mg of vitamin D per day.  If you are older than age 57 but younger than age 36, get at least 1,200 mg of calcium and at least 600 mg of vitamin D per day.  If you are older than age 63, get at least 1,200 mg of calcium and at least 800 mg of vitamin D per  day.  Smoking and excessive alcohol intake increase the risk of osteoporosis. Eat foods that are rich in calcium and vitamin D, and do weight-bearing exercises several times each week as directed by your health care provider. What should I know about how menopause affects my mental health? Depression may occur at any age, but it is more common as you become older. Common symptoms of depression include:  Low or sad mood.  Changes in sleep patterns.  Changes in appetite or eating patterns.  Feeling an overall lack of motivation or enjoyment of activities that you previously enjoyed.  Frequent crying spells.  Talk with your health care provider if you think that you are experiencing depression. What should I know about immunizations?  It is important that you get and maintain your immunizations. These include:  Tetanus, diphtheria, and pertussis (Tdap) booster vaccine.  Influenza every year before the flu season begins.  Pneumonia vaccine.  Shingles vaccine.  Your health care provider may also recommend other immunizations. This information is not intended to replace advice given to you by your health care provider. Make sure you discuss any questions you have with your health care provider. Document Released: 06/12/2005 Document Revised: 11/08/2015 Document Reviewed: 01/22/2015 Elsevier Interactive Patient Education  2018 [employer].

## 2016-11-27 DIAGNOSIS — F411 Generalized anxiety disorder: Secondary | ICD-10-CM | POA: Insufficient documentation

## 2016-11-27 NOTE — Assessment & Plan Note (Signed)
Annual comprehensive preventive exam was done as well as an evaluation and management of chronic conditions .  During the course of the visit the patient was educated and counseled about appropriate screening and preventive services including :  diabetes screening, lipid analysis with projected  10 year  risk for CAD , nutrition counseling, breast, cervical and colorectal cancer screening, and recommended immunizations.  Printed recommendations for health maintenance screenings was given 

## 2016-11-27 NOTE — Assessment & Plan Note (Signed)
I have congratulated her in reduction of   BMI and encouraged  Continued weight loss with goal of 10% of body weigh over the next 6 months using a low glycemic index diet and regular exercise a minimum of 5 days per week.    

## 2016-11-27 NOTE — Assessment & Plan Note (Signed)
she reports compliance with medication regimen  but has an elevated reading today in office.  He has been asked to check his BP at work and  submit readings for evaluation. Renal function will be checked today.  Lab Results  Component Value Date   CREATININE 0.79 06/12/2016

## 2016-11-27 NOTE — Assessment & Plan Note (Signed)
Secondary to increased caregiver burden imposed on her by husband and his family.  Trial of lexapro recommended

## 2016-12-01 ENCOUNTER — Telehealth: Payer: Self-pay | Admitting: Internal Medicine

## 2016-12-01 NOTE — Telephone Encounter (Signed)
Pt

## 2016-12-04 ENCOUNTER — Other Ambulatory Visit: Payer: BLUE CROSS/BLUE SHIELD

## 2016-12-18 ENCOUNTER — Other Ambulatory Visit: Payer: Self-pay | Admitting: Internal Medicine

## 2016-12-23 ENCOUNTER — Encounter: Payer: Self-pay | Admitting: Internal Medicine

## 2016-12-28 ENCOUNTER — Other Ambulatory Visit: Payer: BLUE CROSS/BLUE SHIELD

## 2016-12-29 ENCOUNTER — Other Ambulatory Visit: Payer: Self-pay | Admitting: Internal Medicine

## 2017-01-05 ENCOUNTER — Other Ambulatory Visit: Payer: BLUE CROSS/BLUE SHIELD

## 2017-01-15 ENCOUNTER — Other Ambulatory Visit: Payer: BLUE CROSS/BLUE SHIELD

## 2017-01-21 ENCOUNTER — Other Ambulatory Visit: Payer: BLUE CROSS/BLUE SHIELD

## 2017-01-26 ENCOUNTER — Ambulatory Visit
Admission: RE | Admit: 2017-01-26 | Discharge: 2017-01-26 | Disposition: A | Discharge status: Home / Self Care | Payer: BLUE CROSS/BLUE SHIELD | Source: Ambulatory Visit | Attending: Internal Medicine | Admitting: Internal Medicine

## 2017-01-26 DIAGNOSIS — Z1231 Encounter for screening mammogram for malignant neoplasm of breast: Secondary | ICD-10-CM | POA: Insufficient documentation

## 2017-01-26 DIAGNOSIS — Z1239 Encounter for other screening for malignant neoplasm of breast: Secondary | ICD-10-CM

## 2017-01-27 ENCOUNTER — Other Ambulatory Visit: Payer: Self-pay | Admitting: Internal Medicine

## 2017-01-27 ENCOUNTER — Other Ambulatory Visit: Payer: BLUE CROSS/BLUE SHIELD

## 2017-01-29 ENCOUNTER — Telehealth: Payer: Self-pay | Admitting: Internal Medicine

## 2017-01-29 NOTE — Telephone Encounter (Signed)
Pt called back returning your call. Pt wanted to mention that the medication for depression has been working wonderful. She states that she is taking only a 1/2 tablet daily. Please advise, thank you!

## 2017-01-29 NOTE — Telephone Encounter (Signed)
FYI

## 2017-02-03 ENCOUNTER — Other Ambulatory Visit (INDEPENDENT_AMBULATORY_CARE_PROVIDER_SITE_OTHER): Payer: BLUE CROSS/BLUE SHIELD

## 2017-02-03 DIAGNOSIS — I10 Essential (primary) hypertension: Secondary | ICD-10-CM | POA: Diagnosis not present

## 2017-02-03 DIAGNOSIS — E7800 Pure hypercholesterolemia, unspecified: Secondary | ICD-10-CM | POA: Diagnosis not present

## 2017-02-03 DIAGNOSIS — R5383 Other fatigue: Secondary | ICD-10-CM | POA: Diagnosis not present

## 2017-02-03 LAB — CBC WITH DIFFERENTIAL/PLATELET
Basophils Absolute: 0.1 10*3/uL (ref 0.0–0.1)
Basophils Relative: 1.3 % (ref 0.0–3.0)
Eosinophils Absolute: 0.5 10*3/uL (ref 0.0–0.7)
Eosinophils Relative: 6.5 % — ABNORMAL HIGH (ref 0.0–5.0)
HCT: 39.6 % (ref 36.0–46.0)
Hemoglobin: 13.4 g/dL (ref 12.0–15.0)
Lymphocytes Relative: 32.7 % (ref 12.0–46.0)
Lymphs Abs: 2.3 10*3/uL (ref 0.7–4.0)
MCHC: 33.8 g/dL (ref 30.0–36.0)
MCV: 92.4 fl (ref 78.0–100.0)
Monocytes Absolute: 0.5 10*3/uL (ref 0.1–1.0)
Monocytes Relative: 6.6 % (ref 3.0–12.0)
Neutro Abs: 3.8 10*3/uL (ref 1.4–7.7)
Neutrophils Relative %: 52.9 % (ref 43.0–77.0)
Platelets: 293 10*3/uL (ref 150.0–400.0)
RBC: 4.29 Mil/uL (ref 3.87–5.11)
RDW: 13.5 % (ref 11.5–15.5)
WBC: 7.2 10*3/uL (ref 4.0–10.5)

## 2017-02-03 LAB — LIPID PANEL
Cholesterol: 186 mg/dL (ref 0–200)
HDL: 53.6 mg/dL (ref >39.00)
LDL Cholesterol: 108 mg/dL — ABNORMAL HIGH (ref 0–99)
NonHDL: 132.84
Total CHOL/HDL Ratio: 3
Triglycerides: 125 mg/dL (ref 0.0–149.0)
VLDL: 25 mg/dL (ref 0.0–40.0)

## 2017-02-03 LAB — COMPREHENSIVE METABOLIC PANEL
ALT: 12 U/L (ref 0–35)
AST: 13 U/L (ref 0–37)
Albumin: 4.3 g/dL (ref 3.5–5.2)
Alkaline Phosphatase: 74 U/L (ref 39–117)
BUN: 17 mg/dL (ref 6–23)
CO2: 31 mEq/L (ref 19–32)
Calcium: 9.6 mg/dL (ref 8.4–10.5)
Chloride: 101 mEq/L (ref 96–112)
Creatinine, Ser: 0.88 mg/dL (ref 0.40–1.20)
GFR: 70.66 mL/min (ref >60.00)
Glucose, Bld: 106 mg/dL — ABNORMAL HIGH (ref 70–99)
Potassium: 3.6 mEq/L (ref 3.5–5.1)
Sodium: 140 mEq/L (ref 135–145)
Total Bilirubin: 0.3 mg/dL (ref 0.2–1.2)
Total Protein: 7.7 g/dL (ref 6.0–8.3)

## 2017-02-26 ENCOUNTER — Other Ambulatory Visit: Payer: Self-pay | Admitting: Internal Medicine

## 2017-03-15 ENCOUNTER — Other Ambulatory Visit: Payer: Self-pay | Admitting: Internal Medicine

## 2017-04-29 ENCOUNTER — Other Ambulatory Visit: Payer: Self-pay

## 2017-04-29 MED ORDER — ESCITALOPRAM OXALATE 5 MG PO TABS: 5.0000 mg | ORAL_TABLET | Freq: Every day | ORAL | 0 refills | Status: DC

## 2017-08-09 ENCOUNTER — Other Ambulatory Visit: Payer: Self-pay | Admitting: Internal Medicine

## 2017-10-01 ENCOUNTER — Other Ambulatory Visit: Payer: Self-pay | Admitting: Internal Medicine

## 2017-11-05 ENCOUNTER — Other Ambulatory Visit: Payer: Self-pay | Admitting: Internal Medicine

## 2017-12-01 ENCOUNTER — Ambulatory Visit (INDEPENDENT_AMBULATORY_CARE_PROVIDER_SITE_OTHER): Payer: BLUE CROSS/BLUE SHIELD | Admitting: Podiatry

## 2017-12-01 ENCOUNTER — Encounter: Payer: Self-pay | Admitting: Podiatry

## 2017-12-01 ENCOUNTER — Ambulatory Visit (INDEPENDENT_AMBULATORY_CARE_PROVIDER_SITE_OTHER): Payer: BLUE CROSS/BLUE SHIELD

## 2017-12-01 DIAGNOSIS — M7661 Achilles tendinitis, right leg: Secondary | ICD-10-CM

## 2017-12-01 DIAGNOSIS — G5760 Lesion of plantar nerve, unspecified lower limb: Secondary | ICD-10-CM | POA: Insufficient documentation

## 2017-12-01 MED ORDER — METHYLPREDNISOLONE 4 MG PO TBPK: ORAL_TABLET | ORAL | 0 refills | Status: DC

## 2017-12-01 NOTE — Progress Notes (Signed)
She presents today for chief complaint of pain to the right heel times the past 8 or 9 months she states that has been about 8 months ago that she was standing on her toes and felt a pop in her Achilles since then her heel is been hurting her.  She describes a pulling pain sensation after she is been sitting and then gets back up to walk after she is been resting for a while and then walking again.  States that is starting to affect her ability to perform her daily activities.  Objective: Vital signs are stable she is alert and oriented x3.  Pulses are palpable.  Neurologic sensorium is intact deep tendon reflexes are intact muscle strength is normal symmetrical.  She has pain on palpation of the posterior inferior aspect of the Achilles as it inserts on the right calcaneus.  No erythema edema saline strange odor associated with this.  Radiographs taken today demonstrate soft tissue swelling of the Achilles near its insertion site with a small retrocalcaneal heel spur.  Assessment: Severe Achilles tendinitis retrocalcaneal heel spur  Plan: At this point I start her on a Medrol Dosepak to be followed by meloxicam.  Injected the area with dexamethasone and local anesthetic placed her in a Cam walker and a night splint.  Discussed appropriate shoe gear stretching exercise ice therapy sugar modifications.

## 2017-12-14 ENCOUNTER — Telehealth: Payer: Self-pay | Admitting: Internal Medicine

## 2017-12-14 NOTE — Telephone Encounter (Signed)
Omeprazole 20 mg refill Prescription expired on 01/04/16  Last OV: 11/25/16 PCP: Dr. Darrick Huntsmanullo  Pharmacy: [facility], [phone] [place]

## 2017-12-14 NOTE — Telephone Encounter (Signed)
Copied from CRM [phone]. Topic: Quick Communication - See Telephone Encounter >> Dec 14, 2017  9:55 AM Tamela OddiMartin, [place], NT wrote: CRM for notification. See Telephone encounter for: 12/14/17. Patient called and states she needs a refill of her omeprazole (PRILOSEC) 20 MG capsule. She has an upcoomoing appt with Dr Darrick Huntsmanullo next month.  [facility] [place] - [address] [phone] (Phone) [phone] (Fax)

## 2018-01-05 ENCOUNTER — Encounter: Payer: Self-pay | Admitting: Podiatry

## 2018-01-05 ENCOUNTER — Ambulatory Visit: Payer: BLUE CROSS/BLUE SHIELD | Admitting: Podiatry

## 2018-01-05 DIAGNOSIS — M7661 Achilles tendinitis, right leg: Secondary | ICD-10-CM

## 2018-01-05 NOTE — Progress Notes (Signed)
She presents today for follow-up of her Achilles tendinosis right foot.  States that it feels good in the boot but I have not done much without the boot.  Objective: Vital signs are stable she is alert and oriented x3.  There is no erythema edema saline strange odor she has mild tenderness medial aspect of the fibers of the Achilles that insert on medial aspect of the posterior right heel.  Assessment: Insertional Achilles tendinitis.  Plan: At this point I encouraged her to continue to wear the cam walker for another few days trial some shoes and if they are not doing well then follow-up with me in 1 month and will consider an MRI.

## 2018-01-07 MED ORDER — OMEPRAZOLE 20 MG PO CPDR: 20.0000 mg | DELAYED_RELEASE_CAPSULE | Freq: Every day | ORAL | 0 refills | Status: DC

## 2018-01-07 NOTE — Telephone Encounter (Signed)
It looks like this was last written in 2016 is this ok to refill

## 2018-01-07 NOTE — Telephone Encounter (Signed)
Medication has been refilled for one month.

## 2018-01-10 ENCOUNTER — Other Ambulatory Visit: Payer: Self-pay | Admitting: Internal Medicine

## 2018-01-12 ENCOUNTER — Ambulatory Visit: Payer: BLUE CROSS/BLUE SHIELD | Admitting: Podiatry

## 2018-01-19 ENCOUNTER — Ambulatory Visit (INDEPENDENT_AMBULATORY_CARE_PROVIDER_SITE_OTHER): Payer: BLUE CROSS/BLUE SHIELD | Admitting: Internal Medicine

## 2018-01-19 ENCOUNTER — Encounter: Payer: Self-pay | Admitting: Internal Medicine

## 2018-01-19 VITALS — BP 104/70 | HR 81 | Temp 98.0°F | Resp 16 | Ht 63.0 in | Wt 174.5 lb

## 2018-01-19 DIAGNOSIS — E669 Obesity, unspecified: Secondary | ICD-10-CM | POA: Diagnosis not present

## 2018-01-19 DIAGNOSIS — R7301 Impaired fasting glucose: Secondary | ICD-10-CM | POA: Diagnosis not present

## 2018-01-19 DIAGNOSIS — F411 Generalized anxiety disorder: Secondary | ICD-10-CM

## 2018-01-19 DIAGNOSIS — Z0000 Encounter for general adult medical examination without abnormal findings: Secondary | ICD-10-CM | POA: Diagnosis not present

## 2018-01-19 DIAGNOSIS — E7800 Pure hypercholesterolemia, unspecified: Secondary | ICD-10-CM | POA: Diagnosis not present

## 2018-01-19 DIAGNOSIS — I10 Essential (primary) hypertension: Secondary | ICD-10-CM | POA: Diagnosis not present

## 2018-01-19 MED ORDER — ALPRAZOLAM 0.5 MG PO TABS: ORAL_TABLET | ORAL | 1 refills | Status: DC

## 2018-01-19 MED ORDER — ZOSTER VAC RECOMB ADJUVANTED 50 MCG/0.5ML IM SUSR: 0.5000 mL | Freq: Once | INTRAMUSCULAR | 1 refills | Status: AC

## 2018-01-19 NOTE — Progress Notes (Signed)
Patient ID: Vanessa Richards, female    DOB: 1960/09/12  Age: 57 y.o. MRN: [id number]  The patient is here for annual preventive  examination and management of other chronic and acute problems.    HM reviewed and correct interval screenings confirmed    The risk factors are reflected in the social Vanessa.  The roster of all physicians providing medical care to patient - is listed in the Snapshot section of the chart.  Activities of daily living:  The patient is 100% independent in all ADLs: dressing, toileting, feeding as well as independent mobility  Home safety : The patient has smoke detectors in the home. They wear seatbelts.  There are no firearms at home. There is no violence in the home.   There is no risks for hepatitis, STDs or HIV. There is no   Vanessa of blood transfusion. They have no travel Vanessa to infectious disease endemic areas of the world.  The patient has seen their dentist in the last six month. They have seen their eye doctor in the last year. They admit to slight hearing difficulty with regard to whispered voices and some television programs.  They have deferred audiologic testing in the last year.  They do not  have excessive sun exposure. Discussed the need for sun protection: hats, long sleeves and use of sunscreen if there is significant sun exposure.   Diet: the importance of a healthy diet is discussed. They do have a healthy diet.  The benefits of regular aerobic exercise were discussed. She walks 4 times per week ,  20 minutes.   Depression screen: there are no signs or vegative symptoms of depression- irritability, change in appetite, anhedonia, sadness/tearfullness.  The following portions of the patient's Vanessa were reviewed and updated as appropriate: allergies, current medications, past family Vanessa, past medical Vanessa,  past surgical Vanessa, past social Vanessa  and problem list.  Visual acuity was not assessed per patient preference since  she has regular follow up with her ophthalmologist. Hearing and body mass index were assessed and reviewed.   During the course of the visit the patient was educated and counseled about appropriate screening and preventive services including : fall prevention , diabetes screening, nutrition counseling, colorectal cancer screening, and recommended immunizations.    CC: The primary encounter diagnosis was Encounter for preventive health examination. Diagnoses of Pure hypercholesterolemia, Essential hypertension, Impaired fasting glucose, Generalized anxiety disorder, and Obesity (BMI 30-39.9) were also pertinent to this visit.  Feels great:   1) follow up on GAD:  Tolerating lexapro 1/2  Tablet daily since last year  For familial stressors related to husband's family .  Was up for 24 hours yesterday after assisting in delivery of 57 yr old half step niece 's daughter .  2) right ankle injury ,  achilles tendonitis  [facility] managing  nonsurgically  With CAM walker .  Has improved on repeat evaluation  attributed to CoQ 10 and fish oil   3) taking Co Q 10    Vanessa Richards has a past medical Vanessa of GERD (gastroesophageal reflux disease), Hyperlipidemia, Hypertension, and Migraine syndrome.   She has a past surgical Vanessa that includes Dilation and curettage of uterus; Tubal ligation; Colonoscopy with propofol (N/A, 03/15/2015); and Esophagogastroduodenoscopy (egd) with propofol (N/A, 03/15/2015).   Her family Vanessa includes Arthritis (age of onset: 103) in her sister; Early death in her paternal uncle; Heart disease (age of onset: 58) in her paternal grandfather; Heart disease (age of onset:  70) in her father; Hypertension in her father.She reports that she has never smoked. She has never used smokeless tobacco. She reports that she drinks about 2.0 standard drinks of alcohol per week. She reports that she does not use drugs.  Outpatient Medications Prior to Visit  Medication Sig  Dispense Refill  . escitalopram (LEXAPRO) 5 MG tablet TAKE 1 TABLET BY MOUTH EVERY DAY 90 tablet 0  . metoprolol succinate (TOPROL-XL) 50 MG 24 hr tablet TAKE ONE TABLET BY MOUTH EVERY DAY FOR BLOOD PRESSURE WITH A MEAL 90 tablet 1  . omeprazole (PRILOSEC) 20 MG capsule Take 1 capsule (20 mg total) by mouth daily. 30 capsule 0  . triamterene-hydrochlorothiazide (DYAZIDE) 37.5-25 MG capsule TAKE ONE CAPSULE BY MOUTH EVERY MORNING 90 capsule 1  . ALPRAZolam (XANAX) 0.5 MG tablet alprazolam 0.5 mg tablet     No facility-administered medications prior to visit.     Review of Systems   Patient denies headache, fevers, malaise, unintentional weight loss, skin rash, eye pain, sinus congestion and sinus pain, sore throat, dysphagia,  hemoptysis , cough, dyspnea, wheezing, chest pain, palpitations, orthopnea, edema, abdominal pain, nausea, melena, diarrhea, constipation, flank pain, dysuria, hematuria, urinary  Frequency, nocturia, numbness, tingling, seizures,  Focal weakness, Loss of consciousness,  Tremor, insomnia, depression, anxiety, and suicidal ideation.      Objective:  BP 104/70 (BP Location: Left Arm, Patient Position: Sitting, Cuff Size: Normal)   Pulse 81   Temp 98 F (36.7 C) (Oral)   Resp 16   Ht 5\' 3"  (1.6 m)   Wt 174 lb 8 oz (79.2 kg)   SpO2 97%   BMI 30.91 kg/m   Physical Exam  General appearance: alert, cooperative and appears stated age Head: Normocephalic, without obvious abnormality, atraumatic Eyes: conjunctivae/corneas clear. PERRL, EOM's intact. Fundi benign. Ears: normal TM's and external ear canals both ears Nose: Nares normal. Septum midline. Mucosa normal. No drainage or sinus tenderness. Throat: lips, mucosa, and tongue normal; teeth and gums normal Neck: no adenopathy, no carotid bruit, no JVD, supple, symmetrical, trachea midline and thyroid not enlarged, symmetric, no tenderness/mass/nodules Lungs: clear to auscultation bilaterally Breasts: normal  appearance, no masses or tenderness Heart: regular rate and rhythm, S1, S2 normal, no murmur, click, rub or gallop Abdomen: soft, non-tender; bowel sounds normal; no masses,  no organomegaly Extremities: extremities normal, atraumatic, no cyanosis or edema Pulses: 2+ and symmetric Skin: Skin color, texture, turgor normal. No rashes or lesions Neurologic: Alert and oriented X 3, normal strength and tone. Normal symmetric reflexes. Normal coordination and gait.     Assessment & Plan:   Problem List Items Addressed This Visit    Encounter for preventive health examination - Primary    Annual comprehensive preventive exam was done as well as an evaluation and management of chronic conditions .  During the course of the visit the patient was educated and counseled about appropriate screening and preventive services including :  diabetes screening, lipid analysis with projected  10 year  risk for CAD , nutrition counseling, breast, cervical and colorectal cancer screening, and recommended immunizations.  Printed recommendations for health maintenance screenings was given      Generalized anxiety disorder    Secondary to increased caregiver burden imposed on her by husband and his family.  Trial of lexapro has been successful and alleviating her symptoms.  No changes today      Relevant Medications   ALPRAZolam (XANAX) 0.5 MG tablet   Hyperlipidemia    iMPROVED AT  LAST CHECK WITH a low glycemic index diet, weight  loss and regular exercise. Repeat assessment is due .   Lab Results  Component Value Date   CHOL 186 02/03/2017   HDL 53.60 02/03/2017   LDLCALC 108 (H) 02/03/2017   LDLDIRECT 110.0 11/19/2015   TRIG 125.0 02/03/2017   CHOLHDL 3 02/03/2017   Lab Results  Component Value Date   ALT 12 02/03/2017   AST 13 02/03/2017   ALKPHOS 74 02/03/2017   BILITOT 0.3 02/03/2017         Relevant Orders   TSH   Lipid panel   Hypertension    Well controlled on current regimen. Renal  function is OVERDUE , No changes today.  Lab Results  Component Value Date   CREATININE 0.88 02/03/2017   Lab Results  Component Value Date   NA 140 02/03/2017   K 3.6 02/03/2017   CL 101 02/03/2017   CO2 31 02/03/2017         Relevant Orders   Comprehensive metabolic panel   Obesity (BMI 60-4530-39.9)    I have addressed her weight gain of 11 lb since last year  and encouraged  Continued efforts at weight loss with goal of 10% of body weight over the next 6 months using a low glycemic index diet and regular exercise a minimum of 5 days per week.         Other Visit Diagnoses    Impaired fasting glucose       Relevant Orders   Hemoglobin A1c      I have changed Vanessa Richards's ALPRAZolam. I am also having her start on Zoster Vaccine Adjuvanted. Additionally, I am having her maintain her escitalopram, omeprazole, triamterene-hydrochlorothiazide, and metoprolol succinate.  Meds ordered this encounter  Medications  . Zoster Vaccine Adjuvanted [facility](SHINGRIX) injection    Sig: Inject 0.5 mLs into the muscle once for 1 dose.    Dispense:  1 each    Refill:  1  . ALPRAZolam (XANAX) 0.5 MG tablet    Sig: 1 tablet once daily as needed for anxiety or insomnia    Dispense:  30 tablet    Refill:  1    Medications Discontinued During This Encounter  Medication Reason  . ALPRAZolam (XANAX) 0.5 MG tablet Reorder    Follow-up: Return in about 6 months (around 07/20/2018) for hypertension, obesity, anxiety .   Sherlene Shamseresa L Kelise Kuch, MD

## 2018-01-19 NOTE — Patient Instructions (Signed)
Return for fasting labs asap (only a 4 hour fast is needed)  30 minutes of bike daily to help you lose weight   I have refilled your alprazolam today . Continue to use sparingly   Your mammogram has been ordered.  You can call to make your appointment at [facility]   The ShingRx vaccine is now available in local pharmacies and is much more protective thant Zostavaxs,  It is therefore ADVISED for all interested adults over 50 to prevent shingles    Health Maintenance for Postmenopausal Women Menopause is a normal process in which your reproductive ability comes to an end. This process happens gradually over a span of months to years, usually between the ages of 83 and 9. Menopause is complete when you have missed 12 consecutive menstrual periods. It is important to talk with your health care provider about some of the most common conditions that affect postmenopausal women, such as heart disease, cancer, and bone loss (osteoporosis). Adopting a healthy lifestyle and getting preventive care can help to promote your health and wellness. Those actions can also lower your chances of developing some of these common conditions. What should I know about menopause? During menopause, you may experience a number of symptoms, such as:  Moderate-to-severe hot flashes.  Night sweats.  Decrease in sex drive.  Mood swings.  Headaches.  Tiredness.  Irritability.  Memory problems.  Insomnia.  Choosing to treat or not to treat menopausal changes is an individual decision that you make with your health care provider. What should I know about hormone replacement therapy and supplements? Hormone therapy products are effective for treating symptoms that are associated with menopause, such as hot flashes and night sweats. Hormone replacement carries certain risks, especially as you become older. If you are thinking about using estrogen or estrogen with progestin treatments, discuss the  benefits and risks with your health care provider. What should I know about heart disease and stroke? Heart disease, heart attack, and stroke become more likely as you age. This may be due, in part, to the hormonal changes that your body experiences during menopause. These can affect how your body processes dietary fats, triglycerides, and cholesterol. Heart attack and stroke are both medical emergencies. There are many things that you can do to help prevent heart disease and stroke:  Have your blood pressure checked at least every 1-2 years. High blood pressure causes heart disease and increases the risk of stroke.  If you are 32-61 years old, ask your health care provider if you should take aspirin to prevent a heart attack or a stroke.  Do not use any tobacco products, including cigarettes, chewing tobacco, or electronic cigarettes. If you need help quitting, ask your health care provider.  It is important to eat a healthy diet and maintain a healthy weight. ? Be sure to include plenty of vegetables, fruits, low-fat dairy products, and lean protein. ? Avoid eating foods that are high in solid fats, added sugars, or salt (sodium).  Get regular exercise. This is one of the most important things that you can do for your health. ? Try to exercise for at least 150 minutes each week. The type of exercise that you do should increase your heart rate and make you sweat. This is known as moderate-intensity exercise. ? Try to do strengthening exercises at least twice each week. Do these in addition to the moderate-intensity exercise.  Know your numbers.Ask your health care provider to check your cholesterol and  your blood glucose. Continue to have your blood tested as directed by your health care provider.  What should I know about cancer screening? There are several types of cancer. Take the following steps to reduce your risk and to catch any cancer development as early as possible. Breast  Cancer  Practice breast self-awareness. ? This means understanding how your breasts normally appear and feel. ? It also means doing regular breast self-exams. Let your health care provider know about any changes, no matter how small.  If you are 36 or older, have a clinician do a breast exam (clinical breast exam or CBE) every year. Depending on your age, family history, and medical history, it may be recommended that you also have a yearly breast X-ray (mammogram).  If you have a family history of breast cancer, talk with your health care provider about genetic screening.  If you are at high risk for breast cancer, talk with your health care provider about having an MRI and a mammogram every year.  Breast cancer (BRCA) gene test is recommended for women who have family members with BRCA-related cancers. Results of the assessment will determine the need for genetic counseling and BRCA1 and for BRCA2 testing. BRCA-related cancers include these types: ? Breast. This occurs in males or females. ? Ovarian. ? Tubal. This may also be called fallopian tube cancer. ? Cancer of the abdominal or pelvic lining (peritoneal cancer). ? Prostate. ? Pancreatic.  Cervical, Uterine, and Ovarian Cancer Your health care provider may recommend that you be screened regularly for cancer of the pelvic organs. These include your ovaries, uterus, and vagina. This screening involves a pelvic exam, which includes checking for microscopic changes to the surface of your cervix (Pap test).  For women ages 21-65, health care providers may recommend a pelvic exam and a Pap test every three years. For women ages 54-65, they may recommend the Pap test and pelvic exam, combined with testing for human papilloma virus (HPV), every five years. Some types of HPV increase your risk of cervical cancer. Testing for HPV may also be done on women of any age who have unclear Pap test results.  Other health care providers may not  recommend any screening for nonpregnant women who are considered low risk for pelvic cancer and have no symptoms. Ask your health care provider if a screening pelvic exam is right for you.  If you have had past treatment for cervical cancer or a condition that could lead to cancer, you need Pap tests and screening for cancer for at least 20 years after your treatment. If Pap tests have been discontinued for you, your risk factors (such as having a new sexual partner) need to be reassessed to determine if you should start having screenings again. Some women have medical problems that increase the chance of getting cervical cancer. In these cases, your health care provider may recommend that you have screening and Pap tests more often.  If you have a family history of uterine cancer or ovarian cancer, talk with your health care provider about genetic screening.  If you have vaginal bleeding after reaching menopause, tell your health care provider.  There are currently no reliable tests available to screen for ovarian cancer.  Lung Cancer Lung cancer screening is recommended for adults 57-1 years old who are at high risk for lung cancer because of a history of smoking. A yearly low-dose CT scan of the lungs is recommended if you:  Currently smoke.  Have a history  of at least 30 pack-years of smoking and you currently smoke or have quit within the past 15 years. A pack-year is smoking an average of one pack of cigarettes per day for one year.  Yearly screening should:  Continue until it has been 15 years since you quit.  Stop if you develop a health problem that would prevent you from having lung cancer treatment.  Colorectal Cancer  This type of cancer can be detected and can often be prevented.  Routine colorectal cancer screening usually begins at age 79 and continues through age 51.  If you have risk factors for colon cancer, your health care provider may recommend that you be screened  at an earlier age.  If you have a family history of colorectal cancer, talk with your health care provider about genetic screening.  Your health care provider may also recommend using home test kits to check for hidden blood in your stool.  A small camera at the end of a tube can be used to examine your colon directly (sigmoidoscopy or colonoscopy). This is done to check for the earliest forms of colorectal cancer.  Direct examination of the colon should be repeated every 5-10 years until age 29. However, if early forms of precancerous polyps or small growths are found or if you have a family history or genetic risk for colorectal cancer, you may need to be screened more often.  Skin Cancer  Check your skin from head to toe regularly.  Monitor any moles. Be sure to tell your health care provider: ? About any new moles or changes in moles, especially if there is a change in a mole's shape or color. ? If you have a mole that is larger than the size of a pencil eraser.  If any of your family members has a history of skin cancer, especially at a young age, talk with your health care provider about genetic screening.  Always use sunscreen. Apply sunscreen liberally and repeatedly throughout the day.  Whenever you are outside, protect yourself by wearing long sleeves, pants, a wide-brimmed hat, and sunglasses.  What should I know about osteoporosis? Osteoporosis is a condition in which bone destruction happens more quickly than new bone creation. After menopause, you may be at an increased risk for osteoporosis. To help prevent osteoporosis or the bone fractures that can happen because of osteoporosis, the following is recommended:  If you are 57-70 years old, get at least 1,000 mg of calcium and at least 600 mg of vitamin D per day.  If you are older than age 73 but younger than age 68, get at least 1,200 mg of calcium and at least 600 mg of vitamin D per day.  If you are older than age 64,  get at least 1,200 mg of calcium and at least 800 mg of vitamin D per day.  Smoking and excessive alcohol intake increase the risk of osteoporosis. Eat foods that are rich in calcium and vitamin D, and do weight-bearing exercises several times each week as directed by your health care provider. What should I know about how menopause affects my mental health? Depression may occur at any age, but it is more common as you become older. Common symptoms of depression include:  Low or sad mood.  Changes in sleep patterns.  Changes in appetite or eating patterns.  Feeling an overall lack of motivation or enjoyment of activities that you previously enjoyed.  Frequent crying spells.  Talk with your health care provider  if you think that you are experiencing depression. What should I know about immunizations? It is important that you get and maintain your immunizations. These include:  Tetanus, diphtheria, and pertussis (Tdap) booster vaccine.  Influenza every year before the flu season begins.  Pneumonia vaccine.  Shingles vaccine.  Your health care provider may also recommend other immunizations. This information is not intended to replace advice given to you by your health care provider. Make sure you discuss any questions you have with your health care provider. Document Released: 06/12/2005 Document Revised: 11/08/2015 Document Reviewed: 01/22/2015 Elsevier Interactive Patient Education  2018 [employer]vier Inc.  

## 2018-01-22 NOTE — Assessment & Plan Note (Signed)
Secondary to increased caregiver burden imposed on her by husband and his family.  Trial of lexapro has been successful and alleviating her symptoms.  No changes today

## 2018-01-22 NOTE — Assessment & Plan Note (Signed)
Well controlled on current regimen. Renal function is OVERDUE , No changes today.  Lab Results  Component Value Date   CREATININE 0.88 02/03/2017   Lab Results  Component Value Date   NA 140 02/03/2017   K 3.6 02/03/2017   CL 101 02/03/2017   CO2 31 02/03/2017

## 2018-01-22 NOTE — Assessment & Plan Note (Signed)
Annual comprehensive preventive exam was done as well as an evaluation and management of chronic conditions .  During the course of the visit the patient was educated and counseled about appropriate screening and preventive services including :  diabetes screening, lipid analysis with projected  10 year  risk for CAD , nutrition counseling, breast, cervical and colorectal cancer screening, and recommended immunizations.  Printed recommendations for health maintenance screenings was given 

## 2018-01-22 NOTE — Assessment & Plan Note (Signed)
iMPROVED AT LAST CHECK WITH a low glycemic index diet, weight  loss and regular exercise. Repeat assessment is due .   Lab Results  Component Value Date   CHOL 186 02/03/2017   HDL 53.60 02/03/2017   LDLCALC 108 (H) 02/03/2017   LDLDIRECT 110.0 11/19/2015   TRIG 125.0 02/03/2017   CHOLHDL 3 02/03/2017   Lab Results  Component Value Date   ALT 12 02/03/2017   AST 13 02/03/2017   ALKPHOS 74 02/03/2017   BILITOT 0.3 02/03/2017

## 2018-01-22 NOTE — Assessment & Plan Note (Signed)
I have addressed her weight gain of 11 lb since last year  and encouraged  Continued efforts at weight loss with goal of 10% of body weight over the next 6 months using a low glycemic index diet and regular exercise a minimum of 5 days per week.

## 2018-01-25 ENCOUNTER — Other Ambulatory Visit (INDEPENDENT_AMBULATORY_CARE_PROVIDER_SITE_OTHER): Payer: BLUE CROSS/BLUE SHIELD

## 2018-01-25 DIAGNOSIS — E7800 Pure hypercholesterolemia, unspecified: Secondary | ICD-10-CM

## 2018-01-25 DIAGNOSIS — R7303 Prediabetes: Secondary | ICD-10-CM

## 2018-01-25 DIAGNOSIS — R7301 Impaired fasting glucose: Secondary | ICD-10-CM | POA: Diagnosis not present

## 2018-01-25 DIAGNOSIS — I10 Essential (primary) hypertension: Secondary | ICD-10-CM | POA: Diagnosis not present

## 2018-01-26 LAB — HEMOGLOBIN A1C: Hgb A1c MFr Bld: 5.8 % (ref 4.6–6.5)

## 2018-01-26 LAB — LIPID PANEL
Cholesterol: 204 mg/dL — ABNORMAL HIGH (ref 0–200)
HDL: 51.2 mg/dL (ref >39.00)
NonHDL: 153.14
Total CHOL/HDL Ratio: 4
Triglycerides: 297 mg/dL — ABNORMAL HIGH (ref 0.0–149.0)
VLDL: 59.4 mg/dL — ABNORMAL HIGH (ref 0.0–40.0)

## 2018-01-26 LAB — COMPREHENSIVE METABOLIC PANEL
ALT: 13 U/L (ref 0–35)
AST: 15 U/L (ref 0–37)
Albumin: 4.4 g/dL (ref 3.5–5.2)
Alkaline Phosphatase: 81 U/L (ref 39–117)
BUN: 14 mg/dL (ref 6–23)
CO2: 27 mEq/L (ref 19–32)
Calcium: 9.7 mg/dL (ref 8.4–10.5)
Chloride: 100 mEq/L (ref 96–112)
Creatinine, Ser: 0.83 mg/dL (ref 0.40–1.20)
GFR: 75.33 mL/min (ref >60.00)
Glucose, Bld: 82 mg/dL (ref 70–99)
Potassium: 3.8 mEq/L (ref 3.5–5.1)
Sodium: 137 mEq/L (ref 135–145)
Total Bilirubin: 0.3 mg/dL (ref 0.2–1.2)
Total Protein: 8 g/dL (ref 6.0–8.3)

## 2018-01-26 LAB — LDL CHOLESTEROL, DIRECT: Direct LDL: 105 mg/dL

## 2018-01-26 LAB — TSH: TSH: 2.45 u[IU]/mL (ref 0.35–4.50)

## 2018-01-27 ENCOUNTER — Encounter: Payer: Self-pay | Admitting: Internal Medicine

## 2018-01-27 DIAGNOSIS — R7303 Prediabetes: Secondary | ICD-10-CM | POA: Insufficient documentation

## 2018-01-27 NOTE — Progress Notes (Signed)
Message sent

## 2018-01-27 NOTE — Assessment & Plan Note (Signed)
Her a1c is suggestive of prediabetes .  I recommend she follow a low glycemic index diet and particpate regularly in an aerobic  exercise activity.  We should check an A1c in 6 months.

## 2018-02-02 ENCOUNTER — Ambulatory Visit: Payer: BLUE CROSS/BLUE SHIELD | Admitting: Podiatry

## 2018-02-09 ENCOUNTER — Other Ambulatory Visit: Payer: Self-pay | Admitting: Internal Medicine

## 2018-03-07 ENCOUNTER — Other Ambulatory Visit: Payer: Self-pay | Admitting: Podiatry

## 2018-03-07 ENCOUNTER — Ambulatory Visit (INDEPENDENT_AMBULATORY_CARE_PROVIDER_SITE_OTHER): Payer: BLUE CROSS/BLUE SHIELD

## 2018-03-07 ENCOUNTER — Encounter: Payer: Self-pay | Admitting: Podiatry

## 2018-03-07 ENCOUNTER — Ambulatory Visit: Payer: BLUE CROSS/BLUE SHIELD | Admitting: Podiatry

## 2018-03-07 DIAGNOSIS — M722 Plantar fascial fibromatosis: Secondary | ICD-10-CM

## 2018-03-07 DIAGNOSIS — M129 Arthropathy, unspecified: Secondary | ICD-10-CM

## 2018-03-07 DIAGNOSIS — M205X1 Other deformities of toe(s) (acquired), right foot: Secondary | ICD-10-CM | POA: Diagnosis not present

## 2018-03-07 DIAGNOSIS — M7751 Other enthesopathy of right foot: Secondary | ICD-10-CM

## 2018-03-07 MED ORDER — METHYLPREDNISOLONE 4 MG PO TABS: ORAL_TABLET | ORAL | 0 refills | Status: DC

## 2018-03-07 NOTE — Progress Notes (Addendum)
This patient presents to the office with chief complaint of a painful right foot.  Patient states that the right foot has been painful for the last 4 weeks.  She states that her pain  is related to the that compression sock she wore on her right foot. She states that her whole midfoot was swollen and painful and points to the area of the Lisfranc's joint.  She also was previously treated for  Achilles tendinitis by Dr. Al Corpus, which has resolved.  He denies any history of trauma or injury to the foot. She presents the office today for an evaluation and treatment of her painful right foot.  Vascular  Dorsalis pedis and posterior tibial pulses are palpable  B/L.  Capillary return  WNL.  Temperature gradient is  WNL.  Skin turgor  WNL  Sensorium  Senn Weinstein monofilament wire  WNL. Normal tactile sensation.  Nail Exam  Patient has normal nails with no evidence of bacterial or fungal infection.  Orthopedic  Exam  Muscle tone and muscle strength  WNL.  Palpable pain noted on the dorsal aspect of the first metatarsal right foot.  Patient also has palpable pain noted in the dorsal aspect of the midfoot, right foot.  Her swelling has subsided and there is no evidence of any acute inflammation .    Skin  No open lesions.  Normal skin texture and turgor.   Functional hallux limitus 1st MPJ  Right.  Chronic DJD right midfoot.   ROV.  X-rays were reviewed and they reveal an elongated an elevated first metatarsal right foot.  Calcification was noted at the insertion of the plantar fascia and the Achilles tendon right foot.  Chronic midfoot arthritis noted, right foot.  Discussed this condition with this patient.  Recommended she wear power step insoles in her shoes for 2 weeks.  She is to make an appointment with Raiford Noble to require a set of orthoses to be worn in her shoes. His patient needs kinetic wedge orthoses.  RTC 2 weeks. Prescribe medrol dosepak since she has problems with GERD   Helane Gunther DPM

## 2018-03-23 ENCOUNTER — Ambulatory Visit (INDEPENDENT_AMBULATORY_CARE_PROVIDER_SITE_OTHER): Payer: Self-pay | Admitting: Orthotics

## 2018-03-23 DIAGNOSIS — M722 Plantar fascial fibromatosis: Secondary | ICD-10-CM

## 2018-03-23 DIAGNOSIS — M7751 Other enthesopathy of right foot: Secondary | ICD-10-CM

## 2018-03-23 DIAGNOSIS — M7752 Other enthesopathy of left foot: Secondary | ICD-10-CM

## 2018-03-23 DIAGNOSIS — M205X1 Other deformities of toe(s) (acquired), right foot: Secondary | ICD-10-CM

## 2018-03-23 NOTE — Progress Notes (Signed)
Patient came in today to pick up custom made foot orthotics.  The goals were accomplished and the patient reported no dissatisfaction with said orthotics.  Patient was advised of breakin period and how to report any issues. 

## 2018-03-25 ENCOUNTER — Telehealth: Payer: Self-pay | Admitting: Podiatry

## 2018-03-25 NOTE — Telephone Encounter (Signed)
I returned call to patient and was unable to leave message, voice mail not set up yet.

## 2018-03-25 NOTE — Telephone Encounter (Signed)
Pt called wanting to discuss the pain in her foot. She is scheduled to have Orthotics put in on 12/11. Please give patient a call.

## 2018-03-26 ENCOUNTER — Other Ambulatory Visit: Payer: Self-pay | Admitting: Internal Medicine

## 2018-04-13 ENCOUNTER — Ambulatory Visit: Payer: BLUE CROSS/BLUE SHIELD | Admitting: Orthotics

## 2018-04-13 DIAGNOSIS — M205X1 Other deformities of toe(s) (acquired), right foot: Secondary | ICD-10-CM

## 2018-04-13 DIAGNOSIS — M7751 Other enthesopathy of right foot: Secondary | ICD-10-CM

## 2018-04-13 NOTE — Progress Notes (Signed)
Patient came in today to pick up custom made foot orthotics.  The goals were accomplished and the patient reported no dissatisfaction with said orthotics.  Patient was advised of breakin period and how to report any issues. 

## 2018-04-28 ENCOUNTER — Telehealth: Payer: Self-pay | Admitting: Podiatry

## 2018-04-28 NOTE — Telephone Encounter (Signed)
Pt called stating that she saw Raiford NobleRick in [place] for Orthotics and said he told her to give us a call if she needed Miloxicam. Please give pt a call. [facility]

## 2018-04-29 NOTE — Telephone Encounter (Signed)
I spoke with patient, she stated that since she has been on her feet more over the Christmas holiday, her feet and ankles have been achy and was requesting a script for Meloxicam.  She currently takes Ibuprofen 600mg  with Tylenol which helps, she is just concerned with taking so much Tylenol.  Is it ok to send her in a prescription for Meloxicam?  Please advise

## 2018-05-05 ENCOUNTER — Other Ambulatory Visit: Payer: Self-pay

## 2018-05-05 MED ORDER — MELOXICAM 15 MG PO TABS: 15.0000 mg | ORAL_TABLET | Freq: Every day | ORAL | 3 refills | Status: DC

## 2018-05-05 NOTE — Progress Notes (Signed)
Per Dr. Stacie Acres, Rx for Meloxicam 15mg  sent to pharmacy.  Patient has been notified of script

## 2018-07-08 ENCOUNTER — Ambulatory Visit: Payer: BLUE CROSS/BLUE SHIELD | Admitting: Family Medicine

## 2018-07-08 ENCOUNTER — Encounter: Payer: Self-pay | Admitting: Family Medicine

## 2018-07-08 VITALS — BP 118/80 | HR 84 | Temp 98.7°F | Ht 63.0 in | Wt 176.8 lb

## 2018-07-08 DIAGNOSIS — J309 Allergic rhinitis, unspecified: Secondary | ICD-10-CM

## 2018-07-08 DIAGNOSIS — H9203 Otalgia, bilateral: Secondary | ICD-10-CM

## 2018-07-08 DIAGNOSIS — R0982 Postnasal drip: Secondary | ICD-10-CM | POA: Diagnosis not present

## 2018-07-08 MED ORDER — FLUTICASONE PROPIONATE 50 MCG/ACT NA SUSP: 2.0000 | Freq: Every day | NASAL | 5 refills | Status: DC

## 2018-07-08 MED ORDER — METHYLPREDNISOLONE 4 MG PO TBPK: ORAL_TABLET | ORAL | 0 refills | Status: DC

## 2018-07-08 MED ORDER — LORATADINE 10 MG PO TABS: 10.0000 mg | ORAL_TABLET | Freq: Every day | ORAL | 5 refills | Status: DC

## 2018-07-08 NOTE — Progress Notes (Signed)
Subjective:    Patient ID: Vanessa Richards, female    DOB: [birth year], 58 y.o.   MRN: [id number]  HPI   Patient presents to clinic complaining of head congestion, ear pain off and on for the past 6 weeks.  Patient states she was treated for ear infection with a course of Augmentin approximately 4 weeks ago, symptoms seem to resolve but then slowly come back.  Currently she takes no medication for allergy or sinus congestion.  Denies fever or chills.  Denies hearing loss, states that right ear feels clogged at times but other times feels fine.  Denies sinus headache.  Denies shortness of breath or wheezing.  Denies chest pain, nausea, vomiting or diarrhea.  Patient Active Problem List   Diagnosis Date Noted  . Prediabetes 01/27/2018  . Morton's metatarsalgia 12/01/2017  . Generalized anxiety disorder 11/27/2016  . Hyperlipidemia 11/11/2014  . Encounter for preventive health examination 10/18/2012  . Obesity (BMI 30-39.9) 11/05/2011  . GERD (gastroesophageal reflux disease)   . Hypertension   . Migraine syndrome    Social History   Tobacco Use  . Smoking status: Never Smoker  . Smokeless tobacco: Never Used  Substance Use Topics  . Alcohol use: Yes    Alcohol/week: 2.0 standard drinks    Types: 2 Glasses of wine per week   Review of Systems  Constitutional: Negative for chills, fatigue and fever.  HENT: +congestion, ear pain, sinus pain, nasal drainage. No sore throat.   Eyes: Negative.   Respiratory: Negative for cough, shortness of breath and wheezing.   Cardiovascular: Negative for chest pain, palpitations and leg swelling.  Gastrointestinal: Negative for abdominal pain, diarrhea, nausea and vomiting.  Genitourinary: Negative for dysuria, frequency and urgency.  Musculoskeletal: Negative for arthralgias and myalgias.  Skin: Negative for color change, pallor and rash.  Neurological: Negative for syncope, light-headedness and headaches.  Psychiatric/Behavioral: The  patient is not nervous/anxious.       Objective:   Physical Exam Vitals signs and nursing note reviewed.  Constitutional:      General: She is not in acute distress.    Appearance: She is not toxic-appearing.  HENT:     Head: Normocephalic and atraumatic.     Ears:     Comments: Mild fullness bilat TMs, TMs appear slightly pink    Nose: Congestion and rhinorrhea (clear drainage) present.     Mouth/Throat:     Mouth: Mucous membranes are moist.  Eyes:     General: No scleral icterus.    Extraocular Movements: Extraocular movements intact.     Conjunctiva/sclera: Conjunctivae normal.     Pupils: Pupils are equal, round, and reactive to light.  Neck:     Musculoskeletal: Normal range of motion and neck supple. No neck rigidity.  Cardiovascular:     Rate and Rhythm: Normal rate and regular rhythm.  Pulmonary:     Effort: Pulmonary effort is normal. No respiratory distress.     Breath sounds: Normal breath sounds. No wheezing, rhonchi or rales.  Lymphadenopathy:     Cervical: No cervical adenopathy.  Skin:    General: Skin is warm and dry.     Coloration: Skin is not jaundiced or pale.  Neurological:     Mental Status: She is alert and oriented to person, place, and time.  Psychiatric:        Mood and Affect: Mood normal.        Behavior: Behavior normal.        Thought Content:  Thought content normal.    Today's Vitals   07/08/18 1343  BP: 118/80  Pulse: 84  Temp: 98.7 F (37.1 C)  TempSrc: Oral  SpO2: 98%  Weight: 176 lb 12.8 oz (80.2 kg)  Height: 5\' 3"  (1.6 m)   Body mass index is 31.32 kg/m.     Assessment & Plan:   Allergic rhinitis, pain of both ears, postnasal drainage - I feel patient symptoms are related to seasonal allergies which can cause fullness in the ears, postnasal drainage and nasal congestion.  Patient will begin taking Claritin daily and using Flonase nasal spray to reduce congestion and ear pain.  She will also do steroid taper due to  inflammation and that nasal cavity/areas.  Advised she can take Tylenol or Motrin if needed for any pain.  Advised to drink plenty of fluids, and do good handwashing.  Patient will keep regularly scheduled follow-up with PCP as planned.  She will return to clinic sooner if any issues arise.

## 2018-07-10 ENCOUNTER — Other Ambulatory Visit: Payer: Self-pay | Admitting: Internal Medicine

## 2018-07-11 ENCOUNTER — Encounter: Payer: Self-pay | Admitting: Family Medicine

## 2018-07-26 ENCOUNTER — Ambulatory Visit: Payer: BLUE CROSS/BLUE SHIELD | Admitting: Internal Medicine

## 2018-08-22 ENCOUNTER — Other Ambulatory Visit: Payer: Self-pay

## 2018-08-22 ENCOUNTER — Ambulatory Visit (INDEPENDENT_AMBULATORY_CARE_PROVIDER_SITE_OTHER): Payer: BLUE CROSS/BLUE SHIELD | Admitting: Internal Medicine

## 2018-08-22 DIAGNOSIS — J301 Allergic rhinitis due to pollen: Secondary | ICD-10-CM

## 2018-08-22 DIAGNOSIS — E7800 Pure hypercholesterolemia, unspecified: Secondary | ICD-10-CM | POA: Diagnosis not present

## 2018-08-22 DIAGNOSIS — R7303 Prediabetes: Secondary | ICD-10-CM | POA: Diagnosis not present

## 2018-08-22 DIAGNOSIS — K219 Gastro-esophageal reflux disease without esophagitis: Secondary | ICD-10-CM | POA: Diagnosis not present

## 2018-08-22 DIAGNOSIS — I10 Essential (primary) hypertension: Secondary | ICD-10-CM | POA: Diagnosis not present

## 2018-08-22 DIAGNOSIS — Z7189 Other specified counseling: Secondary | ICD-10-CM

## 2018-08-22 DIAGNOSIS — F411 Generalized anxiety disorder: Secondary | ICD-10-CM

## 2018-08-22 MED ORDER — ESCITALOPRAM OXALATE 5 MG PO TABS: 5.0000 mg | ORAL_TABLET | Freq: Every day | ORAL | 1 refills | Status: DC

## 2018-08-22 NOTE — Progress Notes (Addendum)
Virtual Visit Note   This visit type was conducted due to national recommendations for restrictions regarding the COVID-19 pandemic (e.g. social distancing).  This format is felt to be most appropriate for this patient at this time.  All issues noted in this document were discussed and addressed.  No physical exam was performed (except for noted visual exam findings with Video Visits).   I connected with@ on 08/23/18 at  2:30 PM EDT by a video enabled telemedicine application and verified that I am speaking with the correct person using two identifiers. Location patient: home Location provider: work or home office Persons participating in the virtual visit: patient, provider  I discussed the limitations, risks, security and privacy concerns of performing an evaluation and management service by telephone and the availability of in person appointments. I also discussed with the patient that there may be a patient responsible charge related to this service. The patient expressed understanding and agreed to proceed.  Interactive audio and video telecommunications were attempted between this provider and patient, however failed, due to patient having technical difficulties OR patient did not have access to video capability.  We continued and completed visit with audio only.   Reason for visit: follow up on obesity, hyperlipidemia , hypertension and prediabetes   HPI:  The patient has no signs or symptoms of COVID 19 infection (fever, cough, sore throat  or shortness of breath beyond what is typical for patient).  Patient denies contact with other persons with the above mentioned symptoms or with anyone confirmed to have COVID 19   She was treated by [facility] one month ago for  Cough and congestion secondary to allergic rhinitis /with claritin,  flonase and steroid taper since ears were full .  Cough has significantly  Improved   She is working outside the home  20 hours per week at [employer].  She  works in  Clinical biochemist,  And has not been  wearing a mask  Obesity:  She reports that she has been unable  to lose weight since her last visit .  She is not exercising or following a low glycemic index diet. reviewed past labs and risk for diabetes.   Anxiety:  She reports an improvement in her anxiety due to less responsibilities at home as primary caregiver  For her aunt and mother in law.  Her aunt is now in assisted living, and her mother in law passed away 2 months ago .  She is taking lexapro as directed and has not used xanax  In a long time , uses it for  Rare  panic attacks that typically wake her from sleep.   GERD: she is taking omeprazole daily and avoiding foods and behaviors that trigger her reflux symptoms.  She denies any recent episodes of chest pain    ROS: See pertinent positives and negatives per HPI.  Past Medical History:  Diagnosis Date  . GERD (gastroesophageal reflux disease)   . Hyperlipidemia   . Hypertension   . Migraine syndrome     Past Surgical History:  Procedure Laterality Date  . COLONOSCOPY WITH PROPOFOL N/A 03/15/2015   Procedure: COLONOSCOPY WITH PROPOFOL;  Surgeon: Christena Deem, MD;  Location: [facility] ENDOSCOPY;  Service: Endoscopy;  Laterality: N/A;  . DILATION AND CURETTAGE OF UTERUS    . ESOPHAGOGASTRODUODENOSCOPY (EGD) WITH PROPOFOL N/A 03/15/2015   Procedure: ESOPHAGOGASTRODUODENOSCOPY (EGD) WITH PROPOFOL;  Surgeon: Christena Deem, MD;  Location: [facility] ENDOSCOPY;  Service: Endoscopy;  Laterality: N/A;  .  TUBAL LIGATION      Family History  Problem Relation Age of Onset  . Heart disease Father 61       AMI, smoker. angina in his 33's   . Hypertension Father   . Heart disease Paternal Grandfather 50       AMI  . Arthritis Sister 19       rheumaotid arthritis   . Early death Paternal Uncle   . Breast cancer Neg Hx     SOCIAL HX: married,  No tobacco.     Current Outpatient Medications:  .  ALPRAZolam (XANAX) 0.5 MG tablet,  TAKE ONE TABLET BY MOUTH ONCE DAILY AS NEEDED FOR ANXIETY OR INSOMNIA, Disp: 30 tablet, Rfl: 4 .  escitalopram (LEXAPRO) 5 MG tablet, Take 1 tablet (5 mg total) by mouth daily., Disp: 90 tablet, Rfl: 1 .  fluticasone (FLONASE) 50 MCG/ACT nasal spray, Place 2 sprays into both nostrils daily., Disp: 16 g, Rfl: 5 .  loratadine (CLARITIN) 10 MG tablet, Take 1 tablet (10 mg total) by mouth daily., Disp: 30 tablet, Rfl: 5 .  meloxicam (MOBIC) 15 MG tablet, Take 1 tablet (15 mg total) by mouth daily., Disp: 30 tablet, Rfl: 3 .  metoprolol succinate (TOPROL-XL) 50 MG 24 hr tablet, TAKE ONE TABLET BY MOUTH ONCE DAILY FOR BLOOD PRESSURE WITH A MEAL, Disp: 90 tablet, Rfl: 1 .  omeprazole (PRILOSEC) 20 MG capsule, TAKE ONE CAPSULE BY MOUTH ONCE DAILY, Disp: 90 capsule, Rfl: 1 .  triamterene-hydrochlorothiazide (DYAZIDE) 37.5-25 MG capsule, TAKE ONE CAPSULE BY MOUTH EVERY MORNING, Disp: 90 capsule, Rfl: 1  EXAM:  General impression: alert, cooperative and articulate.  No signs of being in distress  Lungs: speech is fluent sentence length suggests that patient is not short of breath and not punctuated by cough, sneezing or sniffing. Marland Kitchen   Psych: affect normal.  speech is articulate and non pressured .  Denies suicidal thoughts    ASSESSMENT AND PLAN:  Discussed the following assessment and plan:  Pure hypercholesterolemia - Plan: Lipid panel  Prediabetes - Plan: Hemoglobin A1c, Comprehensive metabolic panel  Gastroesophageal reflux disease without esophagitis  Essential hypertension  Generalized anxiety disorder  Educated About Covid-19 Virus Infection  Seasonal allergic rhinitis due to pollen  GERD (gastroesophageal reflux disease) Continue daily PPI and meal modification. Will check b12 and vitamin d level with next blood draw No results found for: VITAMINB12   Hypertension Managed with metoprolol.  Patient encouraged to check BP at home once a week and advised that she should contact PCP  for readings persistently greater than 120/70.   Renal function is nor. mal as of Sept 2019  No changes today.  .tthypertensionplan  Lab Results  Component Value Date   CREATININE 0.83 01/25/2018   Lab Results  Component Value Date   NA 137 01/25/2018   K 3.8 01/25/2018   CL 100 01/25/2018   CO2 27 01/25/2018     Prediabetes Secondary to obesity and sedentary lifestyle.  I have encouraged her to renew her efforts at  weight loss with goal of 10% of body weight over the next 6 months using a low glycemic index diet and regular exercise a minimum of 5 days per week.    Generalized anxiety disorder Improved due to decreased in caregiver burden imposed on her by husband and his family.  Trial of lexapro has been successful and alleviating her symptoms.  No changes today  Educated About Covid-19 Virus Infection Educated patient on the signs and  symptoms of COVID-19 infection and ways to avoid the viral infection including use of facial mask outside of the home (unless walking alone) , washing hands frequently with soap and water,  using hand sanitizer if unable to wash, avoiding touching face,  staying at home and limiting visitors,  and avoiding contact with people coming in and out of home.  Reminded patient to call office with questions/concerns.  The importance of social distancing was discussed today  Allergic rhinitis due to pollen Encouraged to increase Claritin  Go twice daily and continue use of Flonase,  Saline irrigation after working outside also advised     I discussed the assessment and treatment plan with the patient. The patient was provided an opportunity to ask questions and all were answered. The patient agreed with the plan and demonstrated an understanding of the instructions.   The patient was advised to call back or seek an in-person evaluation if the symptoms worsen or if the condition fails to improve as anticipated.  I provided 25 minutes of non-face-to-face  time during this encounter.   Vanessa Richards Caelin Rosen, MD

## 2018-08-22 NOTE — Patient Instructions (Addendum)
You can increase you loratadine to twice daily and continue flonase for the next 6 weeks  I recommend that you wear a mask   When you are at work or leave the house   You are due for fasting labs in one month.  Please start following a low carb diet NOW and START WALKING DAILY

## 2018-08-23 DIAGNOSIS — Z7189 Other specified counseling: Secondary | ICD-10-CM | POA: Insufficient documentation

## 2018-08-23 DIAGNOSIS — J301 Allergic rhinitis due to pollen: Secondary | ICD-10-CM | POA: Insufficient documentation

## 2018-08-23 NOTE — Assessment & Plan Note (Signed)
Continue daily PPI and meal modification. Will check b12 and vitamin d level with next blood draw No results found for: [id number]

## 2018-08-23 NOTE — Assessment & Plan Note (Signed)
Secondary to obesity and sedentary lifestyle.  I have encouraged her to renew her efforts at  weight loss with goal of 10% of body weight over the next 6 months using a low glycemic index diet and regular exercise a minimum of 5 days per week.

## 2018-08-23 NOTE — Assessment & Plan Note (Signed)
Improved due to decreased in caregiver burden imposed on her by husband and his family.  Trial of lexapro has been successful and alleviating her symptoms.  No changes today

## 2018-08-23 NOTE — Assessment & Plan Note (Addendum)
Educated patient on the signs and symptoms of COVID-19 infection and ways to avoid the viral infection including use of facial mask outside of the home (unless walking alone) , washing hands frequently with soap and water,  using hand sanitizer if unable to wash, avoiding touching face,  staying at home and limiting visitors,  and avoiding contact with people coming in and out of home.  Reminded patient to call office with questions/concerns.  The importance of social distancing was discussed today

## 2018-08-23 NOTE — Assessment & Plan Note (Signed)
Encouraged to increase Claritin  Go twice daily and continue use of Flonase,  Saline irrigation after working outside also advised

## 2018-08-23 NOTE — Assessment & Plan Note (Signed)
Managed with metoprolol.  Patient encouraged to check BP at home once a week and advised that she should contact PCP for readings persistently greater than 120/70.   Renal function is nor. mal as of Sept 2019  No changes today.  .tthypertensionplan  Lab Results  Component Value Date   CREATININE 0.83 01/25/2018   Lab Results  Component Value Date   NA 137 01/25/2018   K 3.8 01/25/2018   CL 100 01/25/2018   CO2 27 01/25/2018

## 2018-09-11 ENCOUNTER — Other Ambulatory Visit: Payer: Self-pay | Admitting: Internal Medicine

## 2018-09-12 NOTE — Telephone Encounter (Signed)
Patient is out of medication last OV 08/22/18 last refill 04/01/18 30with 4 refills.

## 2018-09-12 NOTE — Telephone Encounter (Signed)
rx sent in on alprazolam #30 with no refills.

## 2019-01-22 ENCOUNTER — Other Ambulatory Visit: Payer: Self-pay | Admitting: Internal Medicine

## 2019-01-24 ENCOUNTER — Other Ambulatory Visit: Payer: Self-pay | Admitting: Internal Medicine

## 2019-01-25 ENCOUNTER — Other Ambulatory Visit: Payer: Self-pay | Admitting: Lab

## 2019-01-25 DIAGNOSIS — H9203 Otalgia, bilateral: Secondary | ICD-10-CM

## 2019-01-25 DIAGNOSIS — R0982 Postnasal drip: Secondary | ICD-10-CM

## 2019-01-25 DIAGNOSIS — J309 Allergic rhinitis, unspecified: Secondary | ICD-10-CM

## 2019-01-25 MED ORDER — LORATADINE 10 MG PO TABS: 10.0000 mg | ORAL_TABLET | Freq: Every day | ORAL | 5 refills | Status: DC

## 2019-01-25 MED ORDER — FLUTICASONE PROPIONATE 50 MCG/ACT NA SUSP: 2.0000 | Freq: Every day | NASAL | 5 refills | Status: AC

## 2019-01-26 NOTE — Telephone Encounter (Signed)
Refilled: 09/12/2018 Last OV: 08/22/2018 Next OV: not scheduled

## 2019-02-23 ENCOUNTER — Other Ambulatory Visit: Payer: Self-pay | Admitting: Internal Medicine

## 2019-05-12 ENCOUNTER — Telehealth: Payer: Self-pay | Admitting: Internal Medicine

## 2019-05-12 MED ORDER — AMOXICILLIN-POT CLAVULANATE 875-125 MG PO TABS: 1.0000 | ORAL_TABLET | Freq: Two times a day (BID) | ORAL | 0 refills | Status: DC

## 2019-05-12 NOTE — Telephone Encounter (Signed)
I cannot work her in.  I have sent augmentin to [facility]. Daily use of a probiotic advised for 3 weeks.

## 2019-05-12 NOTE — Telephone Encounter (Signed)
Pt called and stated that she has had a bad sinus headache for three days. Unable to work. Right eyelid swollen and jaw pain. Pt is in line at urgent care but wants something called in. I explained to the pt that she should stay at urgent care and be seen. I also let her know that we dont have any appointments available until Monday a.m. but she did not want to schedule anything. Please advise.

## 2019-05-12 NOTE — Telephone Encounter (Signed)
I spoke with pt and she stated that she waited in line at [facility] this morning for an hour and half then left because she still had not been seen. The pt was wondering if you would call something in for her or work her in today for a possible sinus infection. Pt stated that that she has had a sinus headache for 3 days, right swollen eyelid and jaw pain.

## 2019-05-12 NOTE — Telephone Encounter (Signed)
LMTCB

## 2019-05-15 NOTE — Telephone Encounter (Signed)
Spoke with pt and she stated that she did get the antibiotic picked up and stated that she will go get the probiotic today.

## 2019-07-28 ENCOUNTER — Telehealth: Payer: Self-pay

## 2019-07-28 ENCOUNTER — Telehealth: Payer: Self-pay | Admitting: Internal Medicine

## 2019-07-28 ENCOUNTER — Other Ambulatory Visit: Payer: Self-pay

## 2019-07-28 DIAGNOSIS — R0982 Postnasal drip: Secondary | ICD-10-CM

## 2019-07-28 DIAGNOSIS — J309 Allergic rhinitis, unspecified: Secondary | ICD-10-CM

## 2019-07-28 DIAGNOSIS — H9203 Otalgia, bilateral: Secondary | ICD-10-CM

## 2019-07-28 MED ORDER — OMEPRAZOLE 20 MG PO CPDR: 20.0000 mg | DELAYED_RELEASE_CAPSULE | Freq: Every day | ORAL | 1 refills | Status: DC

## 2019-07-28 MED ORDER — LORATADINE 10 MG PO TABS: 10.0000 mg | ORAL_TABLET | Freq: Every day | ORAL | 1 refills | Status: DC

## 2019-07-28 MED ORDER — METOPROLOL SUCCINATE ER 50 MG PO TB24: ORAL_TABLET | ORAL | 1 refills | Status: DC

## 2019-07-28 MED ORDER — MELOXICAM 15 MG PO TABS: 15.0000 mg | ORAL_TABLET | Freq: Every day | ORAL | 1 refills | Status: DC

## 2019-07-28 MED ORDER — ESCITALOPRAM OXALATE 5 MG PO TABS: 5.0000 mg | ORAL_TABLET | Freq: Every day | ORAL | 1 refills | Status: DC

## 2019-07-28 MED ORDER — TRIAMTERENE-HCTZ 37.5-25 MG PO CAPS: 1.0000 | ORAL_CAPSULE | Freq: Every morning | ORAL | 1 refills | Status: DC

## 2019-07-28 NOTE — Telephone Encounter (Signed)
Renee from [facility] called requesting refill for Meloxicam for patient.  Per Dr. Logan Bores, ok to give one refill only and have her schedule appt.    Script has been sent to pharmacy

## 2019-07-28 NOTE — Telephone Encounter (Signed)
Med sent to [facility] with note that patient needs f/u appointment.

## 2019-07-28 NOTE — Telephone Encounter (Signed)
Pt needs refill on loratadine (CLARITIN) 10 MG tablet and triamterene-hydrochlorothiazide (DYAZIDE) 37.5-25 MG capsule and metoprolol succinate (TOPROL-XL) 50 MG 24 hr tablet and omeprazole (PRILOSEC) 20 MG capsule and escitalopram (LEXAPRO) 5 MG tablet

## 2019-08-02 ENCOUNTER — Ambulatory Visit (INDEPENDENT_AMBULATORY_CARE_PROVIDER_SITE_OTHER): Payer: BC Managed Care – PPO | Admitting: Orthotics

## 2019-08-02 ENCOUNTER — Other Ambulatory Visit: Payer: Self-pay

## 2019-08-02 DIAGNOSIS — M205X1 Other deformities of toe(s) (acquired), right foot: Secondary | ICD-10-CM | POA: Diagnosis not present

## 2019-08-02 DIAGNOSIS — M722 Plantar fascial fibromatosis: Secondary | ICD-10-CM

## 2019-08-02 DIAGNOSIS — M7751 Other enthesopathy of right foot: Secondary | ICD-10-CM | POA: Diagnosis not present

## 2019-08-02 NOTE — Progress Notes (Signed)
Patient wants to get second pair just like her 2019 pair.

## 2019-10-08 ENCOUNTER — Other Ambulatory Visit: Payer: Self-pay | Admitting: Internal Medicine

## 2019-10-08 ENCOUNTER — Other Ambulatory Visit: Payer: Self-pay | Admitting: Podiatry

## 2019-10-08 DIAGNOSIS — H9203 Otalgia, bilateral: Secondary | ICD-10-CM

## 2019-10-08 DIAGNOSIS — R0982 Postnasal drip: Secondary | ICD-10-CM

## 2019-10-08 DIAGNOSIS — J309 Allergic rhinitis, unspecified: Secondary | ICD-10-CM

## 2019-11-09 ENCOUNTER — Other Ambulatory Visit: Payer: Self-pay | Admitting: Internal Medicine

## 2019-11-23 ENCOUNTER — Other Ambulatory Visit: Payer: Self-pay | Admitting: Internal Medicine

## 2019-11-23 DIAGNOSIS — H9203 Otalgia, bilateral: Secondary | ICD-10-CM

## 2019-11-23 DIAGNOSIS — J309 Allergic rhinitis, unspecified: Secondary | ICD-10-CM

## 2019-11-23 DIAGNOSIS — R0982 Postnasal drip: Secondary | ICD-10-CM

## 2019-11-23 NOTE — Telephone Encounter (Signed)
LMTCB. Pt has not been seen since 08/2018. Needs to schedule an appt with Dr. Darrick Huntsman to continue getting medication refills.

## 2019-12-08 ENCOUNTER — Ambulatory Visit: Payer: BLUE CROSS/BLUE SHIELD | Admitting: Internal Medicine

## 2019-12-08 ENCOUNTER — Other Ambulatory Visit: Payer: Self-pay | Admitting: Internal Medicine

## 2019-12-08 ENCOUNTER — Other Ambulatory Visit: Payer: Self-pay

## 2019-12-08 ENCOUNTER — Encounter: Payer: Self-pay | Admitting: Internal Medicine

## 2019-12-08 VITALS — BP 144/92 | HR 82 | Temp 98.2°F | Resp 16 | Ht 63.0 in | Wt 155.2 lb

## 2019-12-08 DIAGNOSIS — F411 Generalized anxiety disorder: Secondary | ICD-10-CM | POA: Diagnosis not present

## 2019-12-08 DIAGNOSIS — Z1231 Encounter for screening mammogram for malignant neoplasm of breast: Secondary | ICD-10-CM

## 2019-12-08 DIAGNOSIS — E669 Obesity, unspecified: Secondary | ICD-10-CM

## 2019-12-08 DIAGNOSIS — R7303 Prediabetes: Secondary | ICD-10-CM | POA: Diagnosis not present

## 2019-12-08 DIAGNOSIS — I10 Essential (primary) hypertension: Secondary | ICD-10-CM | POA: Diagnosis not present

## 2019-12-08 DIAGNOSIS — R69 Illness, unspecified: Secondary | ICD-10-CM | POA: Diagnosis not present

## 2019-12-08 DIAGNOSIS — E7800 Pure hypercholesterolemia, unspecified: Secondary | ICD-10-CM

## 2019-12-08 LAB — COMPREHENSIVE METABOLIC PANEL
ALT: 13 U/L (ref 0–35)
AST: 16 U/L (ref 0–37)
Albumin: 4.4 g/dL (ref 3.5–5.2)
Alkaline Phosphatase: 72 U/L (ref 39–117)
BUN: 18 mg/dL (ref 6–23)
CO2: 30 mEq/L (ref 19–32)
Calcium: 9.8 mg/dL (ref 8.4–10.5)
Chloride: 101 mEq/L (ref 96–112)
Creatinine, Ser: 0.78 mg/dL (ref 0.40–1.20)
GFR: 75.65 mL/min (ref >60.00)
Glucose, Bld: 80 mg/dL (ref 70–99)
Potassium: 4.7 mEq/L (ref 3.5–5.1)
Sodium: 138 mEq/L (ref 135–145)
Total Bilirubin: 0.5 mg/dL (ref 0.2–1.2)
Total Protein: 7.6 g/dL (ref 6.0–8.3)

## 2019-12-08 LAB — MICROALBUMIN / CREATININE URINE RATIO
Creatinine,U: 107.9 mg/dL
Microalb Creat Ratio: 1.1 mg/g (ref 0.0–30.0)
Microalb, Ur: 1.2 mg/dL (ref 0.0–1.9)

## 2019-12-08 LAB — TSH: TSH: 1.9 u[IU]/mL (ref 0.35–4.50)

## 2019-12-08 LAB — LIPID PANEL
Cholesterol: 217 mg/dL — ABNORMAL HIGH (ref 0–200)
HDL: 73 mg/dL (ref >39.00)
LDL Cholesterol: 113 mg/dL — ABNORMAL HIGH (ref 0–99)
NonHDL: 143.62
Total CHOL/HDL Ratio: 3
Triglycerides: 152 mg/dL — ABNORMAL HIGH (ref 0.0–149.0)
VLDL: 30.4 mg/dL (ref 0.0–40.0)

## 2019-12-08 LAB — HEMOGLOBIN A1C: Hgb A1c MFr Bld: 5.4 % (ref 4.6–6.5)

## 2019-12-08 MED ORDER — HYDROCHLOROTHIAZIDE 25 MG PO TABS: 25.0000 mg | ORAL_TABLET | Freq: Every day | ORAL | 3 refills | Status: DC

## 2019-12-08 MED ORDER — MELOXICAM 15 MG PO TABS: 15.0000 mg | ORAL_TABLET | Freq: Every day | ORAL | 5 refills | Status: DC

## 2019-12-08 MED ORDER — ALPRAZOLAM 0.5 MG PO TABS: ORAL_TABLET | ORAL | 2 refills | Status: DC

## 2019-12-08 NOTE — Patient Instructions (Addendum)
Your annual mammogram has been ordered.   You are overdue!   You are encouraged (required) to call to make your appointment at [place]  [phone]   The new goals for optimal blood pressure management are 120/70.    Please return in 3 months for your annual CPE and tyr to check your blood pressure a few times at home before then and send me the readings so I can determine if you need a change in medication   Continue metoprolol and triamterene/hctz for your blood pressure control

## 2019-12-08 NOTE — Progress Notes (Signed)
Subjective:  Patient ID: Vanessa Richards, female    DOB: Apr 27, 1961  Age: 59 y.o. MRN: [id number]  CC: The primary encounter diagnosis was Encounter for screening mammogram for malignant neoplasm of breast. Diagnoses of Essential hypertension, Pure hypercholesterolemia, Prediabetes, Generalized anxiety disorder, and Obesity (BMI 30-39.9) were also pertinent to this visit.  HPI Vanessa Richards presents for  FOLLOW UP on obesity, prediabetes and hypertension ,  And GAD. Last seen March 2020   This visit occurred during the SARS-CoV-2 public health emergency.  Safety protocols were in place, including screening questions prior to the visit, additional usage of staff PPE, and extensive cleaning of exam room while observing appropriate contact time as indicated for disinfecting solutions.     She feels generally well.  She lost 28 lbs on the commercially available [employer] diet,  But  Went off it with her new job and has regained 8 lbs.  Having hot flashes now,  Food cravings.  And bp is elevated  GAD:  She is Using alprazolam as needed   1/2 tablet at a time during the day for severe anxiety and for early morning waking .  No longer taking lexapro. She has not had any ER visits  And has not requested any early refills.  Her Refill history was confirmed via [place] Controlled Substance database by me today during her visit and there have been no prescriptions of controlled substances filled from any providers other than me. .   Using meloxicam prn shoulder pain ankle pain  and knee pain  Going to a massage therapist for help in managing her shoulder pain  Patient is taking her medications as prescribed and notes no adverse effects.  Home BP readings have been done about once per week and are  generally < 140/80 .  She is avoiding added salt in her diet and walking regularly about 3 times per week for exercise  .  Torn achilles left ankle  Treated by podiatry  With boot no surgery needed  Outpatient  Medications Prior to Visit  Medication Sig Dispense Refill  . fluticasone (FLONASE) 50 MCG/ACT nasal spray Place 2 sprays into both nostrils daily. 16 g 5  . loratadine (CLARITIN) 10 MG tablet TAKE ONE TABLET BY MOUTH ONCE DAILY 30 tablet 1  . metoprolol succinate (TOPROL-XL) 50 MG 24 hr tablet TAKE ONE TABLET BY MOUTH ONCE DAILY TAKE WITH A MEAL OR IMMEDIATELY FOLLOWING A MEAL 30 tablet 1  . triamterene-hydrochlorothiazide (DYAZIDE) 37.5-25 MG capsule TAKE ONE CAPSULE BY MOUTH EVERY MORNING 30 capsule 1  . ALPRAZolam (XANAX) 0.5 MG tablet TAKE ONE TABLET BY MOUTH ONCE DAILY AS NEEDED FOR ANXIETY OR INSOMNIA 30 tablet 0  . meloxicam (MOBIC) 15 MG tablet Take 1 tablet (15 mg total) by mouth daily. Please call office to schedule follow up appointment 30 tablet 1  . omeprazole (PRILOSEC) 20 MG capsule TAKE ONE CAPSULE BY MOUTH ONCE DAILY 30 capsule 1  . amoxicillin-clavulanate (AUGMENTIN) 875-125 MG tablet Take 1 tablet by mouth 2 (two) times daily. (Patient not taking: Reported on 12/08/2019) 14 tablet 0  . escitalopram (LEXAPRO) 5 MG tablet TAKE ONE TABLET BY MOUTH ONCE DAILY (Patient not taking: Reported on 12/08/2019) 30 tablet 1   No facility-administered medications prior to visit.    Review of Systems;  Patient denies headache, fevers, malaise, unintentional weight loss, skin rash, eye pain, sinus congestion and sinus pain, sore throat, dysphagia,  hemoptysis , cough, dyspnea, wheezing, chest pain, palpitations, orthopnea, edema,  abdominal pain, nausea, melena, diarrhea, constipation, flank pain, dysuria, hematuria, urinary  Frequency, nocturia, numbness, tingling, seizures,  Focal weakness, Loss of consciousness,  Tremor, insomnia, depression, anxiety, and suicidal ideation.      Objective:  BP (!) 144/92 (BP Location: Left Arm, Patient Position: Sitting, Cuff Size: Normal)   Pulse 82   Temp 98.2 F (36.8 C) (Oral)   Resp 16   Ht 5\' 3"  (1.6 m)   Wt 155 lb 3.2 oz (70.4 kg)   SpO2 99%    BMI 27.49 kg/m   BP Readings from Last 3 Encounters:  12/08/19 (!) 144/92  07/08/18 118/80  01/19/18 104/70    Wt Readings from Last 3 Encounters:  12/08/19 155 lb 3.2 oz (70.4 kg)  07/08/18 176 lb 12.8 oz (80.2 kg)  01/19/18 174 lb 8 oz (79.2 kg)    General appearance: alert, cooperative and appears stated age Ears: normal TM's and external ear canals both ears Throat: lips, mucosa, and tongue normal; teeth and gums normal Neck: no adenopathy, no carotid bruit, supple, symmetrical, trachea midline and thyroid not enlarged, symmetric, no tenderness/mass/nodules Back: symmetric, no curvature. ROM normal. No CVA tenderness. Lungs: clear to auscultation bilaterally Heart: regular rate and rhythm, S1, S2 normal, no murmur, click, rub or gallop Abdomen: soft, non-tender; bowel sounds normal; no masses,  no organomegaly Pulses: 2+ and symmetric Skin: Skin color, texture, turgor normal. No rashes or lesions Lymph nodes: Cervical, supraclavicular, and axillary nodes normal.  Lab Results  Component Value Date   HGBA1C 5.4 12/08/2019   HGBA1C 5.8 01/25/2018    Lab Results  Component Value Date   CREATININE 0.78 12/08/2019   CREATININE 0.83 01/25/2018   CREATININE 0.88 02/03/2017    Lab Results  Component Value Date   WBC 7.2 02/03/2017   HGB 13.4 02/03/2017   HCT 39.6 02/03/2017   PLT 293.0 02/03/2017   GLUCOSE 80 12/08/2019   CHOL 217 (H) 12/08/2019   TRIG 152.0 (H) 12/08/2019   HDL 73.00 12/08/2019   LDLDIRECT 105.0 01/25/2018   LDLCALC 113 (H) 12/08/2019   ALT 13 12/08/2019   AST 16 12/08/2019   NA 138 12/08/2019   K 4.7 12/08/2019   CL 101 12/08/2019   CREATININE 0.78 12/08/2019   BUN 18 12/08/2019   CO2 30 12/08/2019   TSH 1.90 12/08/2019   HGBA1C 5.4 12/08/2019   MICROALBUR 1.2 12/08/2019    MM SCREENING BREAST TOMO BILATERAL  Result Date: 01/26/2017 CLINICAL DATA:  Screening. EXAM: 2D DIGITAL SCREENING BILATERAL MAMMOGRAM WITH CAD AND ADJUNCT TOMO  COMPARISON:  Previous exam(s). ACR Breast Density Category c: The breast tissue is heterogeneously dense, which may obscure small masses. FINDINGS: There are no findings suspicious for malignancy. Images were processed with CAD. IMPRESSION: No mammographic evidence of malignancy. A result letter of this screening mammogram will be mailed directly to the patient. RECOMMENDATION: Screening mammogram in one year. (Code:SM-B-01Y) BI-RADS CATEGORY  1: Negative. Electronically Signed   By: 01/28/2017 M.D.   On: 01/26/2017 15:43    Assessment & Plan:   Problem List Items Addressed This Visit      Unprioritized   Generalized anxiety disorder    Improved due to decreased in caregiver burden imposed on her by husband and his family.  No longer taking lexapro,  And using alprazolam prn.  The risks and benefits of benzodiazepine use were discussed with patient today including excessive sedation leading to respiratory depression,  impaired thinking/driving, and addiction.  Patient was advised to  avoid concurrent use with alcohol, to use medication only as needed and not to share with others  .       Relevant Medications   ALPRAZolam (XANAX) 0.5 MG tablet   Hyperlipidemia    iMPROVED WITH a low glycemic index diet, weight  loss and regular exercise.  .   Lab Results  Component Value Date   CHOL 217 (H) 12/08/2019   HDL 73.00 12/08/2019   LDLCALC 113 (H) 12/08/2019   LDLDIRECT 105.0 01/25/2018   TRIG 152.0 (H) 12/08/2019   CHOLHDL 3 12/08/2019   Lab Results  Component Value Date   ALT 13 12/08/2019   AST 16 12/08/2019   ALKPHOS 72 12/08/2019   BILITOT 0.5 12/08/2019         Relevant Orders   Lipid panel (Completed)   TSH (Completed)   Hypertension    she reports compliance with medication regimen  but has an elevated reading today in office.  She is using NSAIDs frequently.  Discussed goal of 120/70  (130/80 for patients over 70)  to preserve renal function.  She has been asked to check  her  BP  at home and  submit readings for evaluation. Renal function, electrolytes and screen for proteinuria are all normal .  Lab Results  Component Value Date   CREATININE 0.78 12/08/2019   Lab Results  Component Value Date   NA 138 12/08/2019   K 4.7 12/08/2019   CL 101 12/08/2019   CO2 30 12/08/2019   Lab Results  Component Value Date   MICROALBUR 1.2 12/08/2019           Relevant Orders   Comprehensive metabolic panel (Completed)   Microalbumin / creatinine urine ratio (Completed)   Obesity (BMI 30-39.9)    I have congratulated her in reduction of   BMI and encouraged  Continued weight loss with goal of 10% of body weigh over the next 6 months using a low glycemic index diet and regular exercise a minimum of 5 days per week.        Prediabetes   Relevant Orders   Hemoglobin A1c (Completed)    Other Visit Diagnoses    Encounter for screening mammogram for malignant neoplasm of breast    -  Primary   Relevant Orders   MM 3D SCREEN BREAST BILATERAL      I have discontinued Vanessa Richards's amoxicillin-clavulanate, escitalopram, and hydrochlorothiazide. I am also having her maintain her fluticasone, loratadine, metoprolol succinate, triamterene-hydrochlorothiazide, meloxicam, and ALPRAZolam.  Meds ordered this encounter  Medications  . meloxicam (MOBIC) 15 MG tablet    Sig: Take 1 tablet (15 mg total) by mouth daily. Please call office to schedule follow up appointment    Dispense:  30 tablet    Refill:  5  . DISCONTD: hydrochlorothiazide (HYDRODIURIL) 25 MG tablet    Sig: Take 1 tablet (25 mg total) by mouth daily.    Dispense:  90 tablet    Refill:  3  . ALPRAZolam (XANAX) 0.5 MG tablet    Sig: TAKE ONE TABLET BY MOUTH ONCE DAILY AS NEEDED FOR ANXIETY OR INSOMNIA    Dispense:  30 tablet    Refill:  2    Medications Discontinued During This Encounter  Medication Reason  . amoxicillin-clavulanate (AUGMENTIN) 875-125 MG tablet   . escitalopram  (LEXAPRO) 5 MG tablet Patient Preference  . meloxicam (MOBIC) 15 MG tablet   . hydrochlorothiazide (HYDRODIURIL) 25 MG tablet   . ALPRAZolam (XANAX) 0.5 MG  tablet Reorder    Follow-up: Return in about 3 months (around 03/09/2020).   Sherlene Shams, MD

## 2019-12-10 NOTE — Assessment & Plan Note (Signed)
Improved due to decreased in caregiver burden imposed on her by husband and his family.  No longer taking lexapro,  And using alprazolam prn.  The risks and benefits of benzodiazepine use were discussed with patient today including excessive sedation leading to respiratory depression,  impaired thinking/driving, and addiction.  Patient was advised to avoid concurrent use with alcohol, to use medication only as needed and not to share with others  .

## 2019-12-10 NOTE — Assessment & Plan Note (Signed)
I have congratulated her in reduction of   BMI and encouraged  Continued weight loss with goal of 10% of body weigh over the next 6 months using a low glycemic index diet and regular exercise a minimum of 5 days per week.    

## 2019-12-10 NOTE — Assessment & Plan Note (Signed)
iMPROVED WITH a low glycemic index diet, weight  loss and regular exercise.  .   Lab Results  Component Value Date   CHOL 217 (H) 12/08/2019   HDL 73.00 12/08/2019   LDLCALC 113 (H) 12/08/2019   LDLDIRECT 105.0 01/25/2018   TRIG 152.0 (H) 12/08/2019   CHOLHDL 3 12/08/2019   Lab Results  Component Value Date   ALT 13 12/08/2019   AST 16 12/08/2019   ALKPHOS 72 12/08/2019   BILITOT 0.5 12/08/2019

## 2019-12-10 NOTE — Assessment & Plan Note (Signed)
she reports compliance with medication regimen  but has an elevated reading today in office.  She is using NSAIDs frequently.  Discussed goal of 120/70  (130/80 for patients over 70)  to preserve renal function.  She has been asked to check her  BP  at home and  submit readings for evaluation. Renal function, electrolytes and screen for proteinuria are all normal .  Lab Results  Component Value Date   CREATININE 0.78 12/08/2019   Lab Results  Component Value Date   NA 138 12/08/2019   K 4.7 12/08/2019   CL 101 12/08/2019   CO2 30 12/08/2019   Lab Results  Component Value Date   MICROALBUR 1.2 12/08/2019

## 2020-01-06 ENCOUNTER — Other Ambulatory Visit: Payer: Self-pay | Admitting: Internal Medicine

## 2020-01-06 DIAGNOSIS — J309 Allergic rhinitis, unspecified: Secondary | ICD-10-CM

## 2020-01-06 DIAGNOSIS — H9203 Otalgia, bilateral: Secondary | ICD-10-CM

## 2020-01-06 DIAGNOSIS — R0982 Postnasal drip: Secondary | ICD-10-CM

## 2020-01-31 ENCOUNTER — Encounter: Payer: Self-pay | Admitting: Internal Medicine

## 2020-02-06 ENCOUNTER — Other Ambulatory Visit: Payer: Self-pay | Admitting: Internal Medicine

## 2020-03-14 ENCOUNTER — Ambulatory Visit (INDEPENDENT_AMBULATORY_CARE_PROVIDER_SITE_OTHER): Payer: 59 | Admitting: Internal Medicine

## 2020-03-14 ENCOUNTER — Other Ambulatory Visit: Payer: Self-pay

## 2020-03-14 ENCOUNTER — Encounter: Payer: Self-pay | Admitting: Internal Medicine

## 2020-03-14 ENCOUNTER — Telehealth: Payer: Self-pay | Admitting: Internal Medicine

## 2020-03-14 VITALS — BP 156/90 | HR 81 | Temp 98.8°F | Resp 16 | Ht 63.0 in | Wt 161.0 lb

## 2020-03-14 DIAGNOSIS — I10 Essential (primary) hypertension: Secondary | ICD-10-CM

## 2020-03-14 DIAGNOSIS — R69 Illness, unspecified: Secondary | ICD-10-CM | POA: Diagnosis not present

## 2020-03-14 DIAGNOSIS — Z0000 Encounter for general adult medical examination without abnormal findings: Secondary | ICD-10-CM

## 2020-03-14 DIAGNOSIS — R635 Abnormal weight gain: Secondary | ICD-10-CM | POA: Diagnosis not present

## 2020-03-14 DIAGNOSIS — R7303 Prediabetes: Secondary | ICD-10-CM | POA: Diagnosis not present

## 2020-03-14 DIAGNOSIS — E782 Mixed hyperlipidemia: Secondary | ICD-10-CM

## 2020-03-14 DIAGNOSIS — Z1231 Encounter for screening mammogram for malignant neoplasm of breast: Secondary | ICD-10-CM | POA: Diagnosis not present

## 2020-03-14 DIAGNOSIS — F411 Generalized anxiety disorder: Secondary | ICD-10-CM

## 2020-03-14 LAB — COMPREHENSIVE METABOLIC PANEL
ALT: 14 U/L (ref 0–35)
AST: 17 U/L (ref 0–37)
Albumin: 4.5 g/dL (ref 3.5–5.2)
Alkaline Phosphatase: 80 U/L (ref 39–117)
BUN: 13 mg/dL (ref 6–23)
CO2: 31 mEq/L (ref 19–32)
Calcium: 9.4 mg/dL (ref 8.4–10.5)
Chloride: 99 mEq/L (ref 96–112)
Creatinine, Ser: 0.87 mg/dL (ref 0.40–1.20)
GFR: 73.11 mL/min (ref >60.00)
Glucose, Bld: 88 mg/dL (ref 70–99)
Potassium: 4 mEq/L (ref 3.5–5.1)
Sodium: 138 mEq/L (ref 135–145)
Total Bilirubin: 0.5 mg/dL (ref 0.2–1.2)
Total Protein: 7.3 g/dL (ref 6.0–8.3)

## 2020-03-14 LAB — MICROALBUMIN / CREATININE URINE RATIO
Creatinine,U: 72.1 mg/dL
Microalb Creat Ratio: 1 mg/g (ref 0.0–30.0)
Microalb, Ur: <0.7 mg/dL (ref 0.0–1.9)

## 2020-03-14 LAB — HEMOGLOBIN A1C: Hgb A1c MFr Bld: 5.6 % (ref 4.6–6.5)

## 2020-03-14 MED ORDER — ZOSTER VAC RECOMB ADJUVANTED 50 MCG/0.5ML IM SUSR: 0.5000 mL | Freq: Once | INTRAMUSCULAR | 1 refills | Status: AC

## 2020-03-14 MED ORDER — METOPROLOL SUCCINATE ER 50 MG PO TB24
50.0000 mg | ORAL_TABLET | Freq: Every day | ORAL | 1 refills | Status: DC
Start: 2020-03-14 — End: 2020-04-05

## 2020-03-14 MED ORDER — METOPROLOL SUCCINATE ER 50 MG PO TB24: ORAL_TABLET | ORAL | 1 refills | Status: DC

## 2020-03-14 MED ORDER — METOPROLOL SUCCINATE ER 50 MG PO TB24
ORAL_TABLET | ORAL | 1 refills | Status: DC
Start: 2020-03-14 — End: 2020-03-14

## 2020-03-14 NOTE — Patient Instructions (Addendum)
Increase metoprolol t o 100 mg and try taking it at night instead of daytime  Continue triamterene as you are doing   Goal is 130/80 or less for BP  And pulse not below 55   If not tolerated  Call for alternative      TRY  The Healthy Choice low carb power bowls and lean cuisine chicken/broccoli alfredo meals for dinner  Your annual mammogram has been ordered AGAIN!!!! .  You are encouraged (required) to call to make your appointment at [place]  [phone]    The ShingRx vaccine is now available in local pharmacies and is much more protective than the old one  Zostavax  (it is about 97%  Effective in preventing shingles). .   It is therefore ADVISED for all interested adults over 50 to prevent shingles so I have printed you a prescription for it.  (it requires a 2nd dose 2 too 6 months after the first one) .  It will cause you to have flu  like symptoms for 2 days     Health Maintenance for Postmenopausal Women Menopause is a normal process in which your ability to get pregnant comes to an end. This process happens slowly over many months or years, usually between the ages of 56 and 80. Menopause is complete when you have missed your menstrual periods for 12 months. It is important to talk with your health care provider about some of the most common conditions that affect women after menopause (postmenopausal women). These include heart disease, cancer, and bone loss (osteoporosis). Adopting a healthy lifestyle and getting preventive care can help to promote your health and wellness. The actions you take can also lower your chances of developing some of these common conditions. What should I know about menopause? During menopause, you may get a number of symptoms, such as:  Hot flashes. These can be moderate or severe.  Night sweats.  Decrease in sex drive.  Mood swings.  Headaches.  Tiredness.  Irritability.  Memory problems.  Insomnia. Choosing to treat or not to treat  these symptoms is a decision that you make with your health care provider. Do I need hormone replacement therapy?  Hormone replacement therapy is effective in treating symptoms that are caused by menopause, such as hot flashes and night sweats.  Hormone replacement carries certain risks, especially as you become older. If you are thinking about using estrogen or estrogen with progestin, discuss the benefits and risks with your health care provider. What is my risk for heart disease and stroke? The risk of heart disease, heart attack, and stroke increases as you age. One of the causes may be a change in the body's hormones during menopause. This can affect how your body uses dietary fats, triglycerides, and cholesterol. Heart attack and stroke are medical emergencies. There are many things that you can do to help prevent heart disease and stroke. Watch your blood pressure  High blood pressure causes heart disease and increases the risk of stroke. This is more likely to develop in people who have high blood pressure readings, are of African descent, or are overweight.  Have your blood pressure checked: ? Every 3-5 years if you are 21-60 years of age. ? Every year if you are 19 years old or older. Eat a healthy diet   Eat a diet that includes plenty of vegetables, fruits, low-fat dairy products, and lean protein.  Do not eat a lot of foods that are high in solid fats, added  sugars, or sodium. Get regular exercise Get regular exercise. This is one of the most important things you can do for your health. Most adults should:  Try to exercise for at least 150 minutes each week. The exercise should increase your heart rate and make you sweat (moderate-intensity exercise).  Try to do strengthening exercises at least twice each week. Do these in addition to the moderate-intensity exercise.  Spend less time sitting. Even light physical activity can be beneficial. Other tips  Work with your health  care provider to achieve or maintain a healthy weight.  Do not use any products that contain nicotine or tobacco, such as cigarettes, e-cigarettes, and chewing tobacco. If you need help quitting, ask your health care provider.  Know your numbers. Ask your health care provider to check your cholesterol and your blood sugar (glucose). Continue to have your blood tested as directed by your health care provider. Do I need screening for cancer? Depending on your health history and family history, you may need to have cancer screening at different stages of your life. This may include screening for:  Breast cancer.  Cervical cancer.  Lung cancer.  Colorectal cancer. What is my risk for osteoporosis? After menopause, you may be at increased risk for osteoporosis. Osteoporosis is a condition in which bone destruction happens more quickly than new bone creation. To help prevent osteoporosis or the bone fractures that can happen because of osteoporosis, you may take the following actions:  If you are 56-61 years old, get at least 1,000 mg of calcium and at least 600 mg of vitamin D per day.  If you are older than age 49 but younger than age 80, get at least 1,200 mg of calcium and at least 600 mg of vitamin D per day.  If you are older than age 61, get at least 1,200 mg of calcium and at least 800 mg of vitamin D per day. Smoking and drinking excessive alcohol increase the risk of osteoporosis. Eat foods that are rich in calcium and vitamin D, and do weight-bearing exercises several times each week as directed by your health care provider. How does menopause affect my mental health? Depression may occur at any age, but it is more common as you become older. Common symptoms of depression include:  Low or sad mood.  Changes in sleep patterns.  Changes in appetite or eating patterns.  Feeling an overall lack of motivation or enjoyment of activities that you previously enjoyed.  Frequent crying  spells. Talk with your health care provider if you think that you are experiencing depression. General instructions See your health care provider for regular wellness exams and vaccines. This may include:  Scheduling regular health, dental, and eye exams.  Getting and maintaining your vaccines. These include: ? Influenza vaccine. Get this vaccine each year before the flu season begins. ? Pneumonia vaccine. ? Shingles vaccine. ? Tetanus, diphtheria, and pertussis (Tdap) booster vaccine. Your health care provider may also recommend other immunizations. Tell your health care provider if you have ever been abused or do not feel safe at home. Summary  Menopause is a normal process in which your ability to get pregnant comes to an end.  This condition causes hot flashes, night sweats, decreased interest in sex, mood swings, headaches, or lack of sleep.  Treatment for this condition may include hormone replacement therapy.  Take actions to keep yourself healthy, including exercising regularly, eating a healthy diet, watching your weight, and checking your blood pressure and  blood sugar levels.  Get screened for cancer and depression. Make sure that you are up to date with all your vaccines. This information is not intended to replace advice given to you by your health care provider. Make sure you discuss any questions you have with your health care provider. Document Revised: 04/13/2018 Document Reviewed: 04/13/2018 Elsevier Patient Education  2020 [employer].

## 2020-03-14 NOTE — Telephone Encounter (Signed)
Britt Boozer from [facility] called to verify direction ometoprolol succinate (TOPROL-XL) 50 MG 24 hr tablet

## 2020-03-14 NOTE — Assessment & Plan Note (Addendum)
Increasing toprol dose to 100 mg daily starting today.  Patient advised  To recheck BP after one week once daily for several days and advise  Me of readings. Will add amlodipine next if increased dose of metoprolol is  not tolerated or if BP is  not at goal.

## 2020-03-14 NOTE — Progress Notes (Signed)
Patient ID: Vanessa Richards, female    DOB: [birth year]  Age: 59 y.o. MRN: [id number]  The patient is here for annual preventive examination and management of other chronic and acute problems.  This visit occurred during the SARS-CoV-2 public health emergency.  Safety protocols were in place, including screening questions prior to the visit, additional usage of staff PPE, and extensive cleaning of exam room while observing appropriate contact time as indicated for disinfecting solutions.    Patient has received NO  doses of the available COVID 19 vaccines.   Patient continues to mask when outside of the home except when walking in yard or at safe distances from others .  Patient denies any change in mood or development of unhealthy behaviors resuting from the pandemic's restriction of activities and socialization.    sHE ALSO DECLINES THE FLU VACCINE   / The risk factors are reflected in the social history.  The roster of all physicians providing medical care to patient - is listed in the Snapshot section of the chart.  Activities of daily living:  The patient is 100% independent in all ADLs: dressing, toileting, feeding as well as independent mobility  Home safety : The patient has smoke detectors in the home. They wear seatbelts.  There are no firearms at home. There is no violence in the home.   There is no risks for hepatitis, STDs or HIV. There is no   history of blood transfusion. They have no travel history to infectious disease endemic areas of the world.  The patient has seen their dentist in the last six month. They have seen their eye doctor in the last year. They admit to slight hearing difficulty with regard to whispered voices and some television programs.  They have deferred audiologic testing in the last year.  They do not  have excessive sun exposure. Discussed the need for sun protection: hats, long sleeves and use of sunscreen if there is significant sun exposure.   Diet: the  importance of a healthy diet is discussed. They do have a healthy diet.  The benefits of regular aerobic exercise were discussed. She walks 4 times per week ,  20 minutes.   Depression screen: there are no signs or vegative symptoms of depression- irritability, change in appetite, anhedonia, sadness/tearfullness. The following portions of the patient's history were reviewed and updated as appropriate: allergies, current medications, past family history, past medical history,  past surgical history, past social history  and problem list.  Visual acuity was not assessed per patient preference since she has regular follow up with her ophthalmologist. Hearing and body mass index were assessed and reviewed.   During the course of the visit the patient was educated and counseled about appropriate screening and preventive services including : fall prevention , diabetes screening, nutrition counseling, colorectal cancer screening, and recommended immunizations.    CC: The primary encounter diagnosis was Encounter for screening mammogram for malignant neoplasm of breast. Diagnoses of Elevated blood pressure reading with diagnosis of hypertension, Weight gain, Prediabetes, Primary hypertension, Encounter for preventive health examination, Generalized anxiety disorder, and Moderate mixed hyperlipidemia not requiring statin therapy were also pertinent to this visit.   1) elevated blood pressure readings at work and at  Home.  130/80 at home but at work 148/102 recently .   Feels stressed out lately,  Using alprazolam prn 1/2 to 1/4 dose   2) Insomnia now using  relaxing music to help  her sleep using an app on her  phone   History Redell has a past medical history of GERD (gastroesophageal reflux disease), Hyperlipidemia, Hypertension, and Migraine syndrome.   She has a past surgical history that includes Dilation and curettage of uterus; Tubal ligation; Colonoscopy with propofol (N/A, 03/15/2015); and  Esophagogastroduodenoscopy (egd) with propofol (N/A, 03/15/2015).   Her family history includes Arthritis (age of onset: 51) in her sister; Early death in her paternal uncle; Heart disease (age of onset: 2) in her paternal grandfather; Heart disease (age of onset: 60) in her father; Hypertension in her father.She reports that she has never smoked. She has never used smokeless tobacco. She reports current alcohol use of about 2.0 standard drinks of alcohol per week. She reports that she does not use drugs.  Outpatient Medications Prior to Visit  Medication Sig Dispense Refill  . ALPRAZolam (XANAX) 0.5 MG tablet TAKE ONE TABLET BY MOUTH ONCE DAILY AS NEEDED FOR ANXIETY OR INSOMNIA 30 tablet 2  . escitalopram (LEXAPRO) 5 MG tablet TAKE ONE TABLET BY MOUTH ONCE DAILY 30 tablet 1  . fluticasone (FLONASE) 50 MCG/ACT nasal spray Place 2 sprays into both nostrils daily. 16 g 5  . loratadine (CLARITIN) 10 MG tablet TAKE ONE TABLET BY MOUTH ONCE DAILY 30 tablet 1  . meloxicam (MOBIC) 15 MG tablet Take 1 tablet (15 mg total) by mouth daily. Please call office to schedule follow up appointment 30 tablet 5  . omeprazole (PRILOSEC) 20 MG capsule TAKE ONE CAPSULE BY MOUTH ONCE DAILY 30 capsule 1  . triamterene-hydrochlorothiazide (DYAZIDE) 37.5-25 MG capsule TAKE ONE CAPSULE BY MOUTH EVERY MORNING 30 capsule 1  . metoprolol succinate (TOPROL-XL) 50 MG 24 hr tablet TAKE ONE TABLET BY MOUTH ONCE DAILY TAKE WITH A MEAL OR IMMEDIATELY FOLLOWING A MEAL 30 tablet 1   No facility-administered medications prior to visit.    Review of Systems   Patient denies headache, fevers, malaise, unintentional weight loss, skin rash, eye pain, sinus congestion and sinus pain, sore throat, dysphagia,  hemoptysis , cough, dyspnea, wheezing, chest pain, palpitations, orthopnea, edema, abdominal pain, nausea, melena, diarrhea, constipation, flank pain, dysuria, hematuria, urinary  Frequency, nocturia, numbness, tingling, seizures,   Focal weakness, Loss of consciousness,  Tremor, insomnia, depression, anxiety, and suicidal ideation.      Objective:  BP (!) 156/90 (BP Location: Left Arm, Patient Position: Sitting, Cuff Size: Normal)   Pulse 81   Temp 98.8 F (37.1 C) (Oral)   Resp 16   Ht 5\' 3"  (1.6 m)   Wt 161 lb (73 kg)   SpO2 99%   BMI 28.52 kg/m   Physical Exam  General appearance: alert, cooperative and appears stated age Head: Normocephalic, without obvious abnormality, atraumatic Eyes: conjunctivae/corneas clear. PERRL, EOM's intact. Fundi benign. Ears: normal TM's and external ear canals both ears Nose: Nares normal. Septum midline. Mucosa normal. No drainage or sinus tenderness. Throat: lips, mucosa, and tongue normal; teeth and gums normal Neck: no adenopathy, no carotid bruit, no JVD, supple, symmetrical, trachea midline and thyroid not enlarged, symmetric, no tenderness/mass/nodules Lungs: clear to auscultation bilaterally Breasts: normal appearance, no masses or tenderness Heart: regular rate and rhythm, S1, S2 normal, no murmur, click, rub or gallop Abdomen: soft, non-tender; bowel sounds normal; no masses,  no organomegaly Extremities: extremities normal, atraumatic, no cyanosis or edema Pulses: 2+ and symmetric Skin: Skin color, texture, turgor normal. No rashes or lesions Neurologic: Alert and oriented X 3, normal strength and tone. Normal symmetric reflexes. Normal coordination and gait.    Assessment & Plan:  Problem List Items Addressed This Visit      Unprioritized   Encounter for preventive health examination    age appropriate education and counseling updated, referrals for preventative services and immunizations addressed, dietary and smoking counseling addressed, most recent labs reviewed.  I have personally reviewed and have noted:  1) the patient's medical and social history 2) The pt's use of alcohol, tobacco, and illicit drugs 3) The patient's current medications and  supplements 4) Functional ability including ADL's, fall risk, home safety risk, hearing and visual impairment 5) Diet and physical activities 6) Evidence for depression or mood disorder 7) The patient's height, weight, and BMI have been recorded in the chart  I have made referrals, and provided counseling and education based on review of the above      Generalized anxiety disorder    Improved due to decreased in caregiver burden imposed on her by husband and his family.  lShe has resumed 5 mg exapro,  And using alprazolam prn.  The risks and benefits of benzodiazepine use were discussed with patient today including excessive sedation leading to respiratory depression,  impaired thinking/driving, and addiction.  Patient was advised to avoid concurrent use with alcohol, to use medication only as needed and not to share with others  .       Hyperlipidemia    iMPROVED WITH a low glycemic index diet, weight  loss and regular exercise.  10 yr risk using the [facility] calculator is < 5%  Lab Results  Component Value Date   CHOL 217 (H) 12/08/2019   HDL 73.00 12/08/2019   LDLCALC 113 (H) 12/08/2019   LDLDIRECT 105.0 01/25/2018   TRIG 152.0 (H) 12/08/2019   CHOLHDL 3 12/08/2019   Lab Results  Component Value Date   ALT 14 03/14/2020   AST 17 03/14/2020   ALKPHOS 80 03/14/2020   BILITOT 0.5 03/14/2020         Hypertension    Increasing toprol dose to 100 mg daily starting today.  Patient advised  To recheck BP after one week once daily for several days and advise  Me of readings. Will add amlodipine next if increased dose of metoprolol is  not tolerated or if BP is  not at goal.        Prediabetes    A1c dropped while adhering the the [employer] diet but she has experienced some weight gain.  I have addressed  Her weight gain and  recommended continued adherence to a low glycemic index diet and regular exercise a minimum of 5 days per week.    Lab Results  Component Value Date   HGBA1C 5.6  03/14/2020         Relevant Orders   Hemoglobin A1c (Completed)    Other Visit Diagnoses    Encounter for screening mammogram for malignant neoplasm of breast    -  Primary   Relevant Orders   MM 3D SCREEN BREAST BILATERAL   Elevated blood pressure reading with diagnosis of hypertension       Relevant Orders   Comprehensive metabolic panel (Completed)   Microalbumin / creatinine urine ratio (Completed)   Weight gain          I have discontinued Eilene A. Savich's metoprolol succinate. I am also having her start on Zoster Vaccine Adjuvanted. Additionally, I am having her maintain her fluticasone, meloxicam, omeprazole, triamterene-hydrochlorothiazide, loratadine, escitalopram, and ALPRAZolam.  Meds ordered this encounter  Medications  . DISCONTD: metoprolol succinate (TOPROL-XL) 50 MG 24 hr  tablet    Sig: Take with or immediately following a meal.    Dispense:  90 tablet    Refill:  1  . Zoster Vaccine Adjuvanted [facility]) injection    Sig: Inject 0.5 mLs into the muscle once for 1 dose.    Dispense:  1 each    Refill:  1    Medications Discontinued During This Encounter  Medication Reason  . metoprolol succinate (TOPROL-XL) 50 MG 24 hr tablet Reorder    Follow-up: No follow-ups on file.   Sherlene Shams, MD

## 2020-03-16 NOTE — Assessment & Plan Note (Signed)
iMPROVED WITH a low glycemic index diet, weight  loss and regular exercise.  10 yr risk using the [facility] calculator is < 5%  Lab Results  Component Value Date   CHOL 217 (H) 12/08/2019   HDL 73.00 12/08/2019   LDLCALC 113 (H) 12/08/2019   LDLDIRECT 105.0 01/25/2018   TRIG 152.0 (H) 12/08/2019   CHOLHDL 3 12/08/2019   Lab Results  Component Value Date   ALT 14 03/14/2020   AST 17 03/14/2020   ALKPHOS 80 03/14/2020   BILITOT 0.5 03/14/2020

## 2020-03-16 NOTE — Assessment & Plan Note (Addendum)
A1c dropped while adhering the the [employer] diet but she has experienced some weight gain.  I have addressed  Her weight gain and  recommended continued adherence to a low glycemic index diet and regular exercise a minimum of 5 days per week.    Lab Results  Component Value Date   HGBA1C 5.6 03/14/2020

## 2020-03-16 NOTE — Assessment & Plan Note (Addendum)
Improved due to decreased in caregiver burden imposed on her by husband and his family.  lShe has resumed 5 mg exapro,  And using alprazolam prn.  The risks and benefits of benzodiazepine use were discussed with patient today including excessive sedation leading to respiratory depression,  impaired thinking/driving, and addiction.  Patient was advised to avoid concurrent use with alcohol, to use medication only as needed and not to share with others  .

## 2020-03-16 NOTE — Assessment & Plan Note (Signed)

## 2020-03-20 ENCOUNTER — Other Ambulatory Visit: Payer: Self-pay | Admitting: Internal Medicine

## 2020-03-20 DIAGNOSIS — R0982 Postnasal drip: Secondary | ICD-10-CM

## 2020-03-20 DIAGNOSIS — J309 Allergic rhinitis, unspecified: Secondary | ICD-10-CM

## 2020-03-20 DIAGNOSIS — H9203 Otalgia, bilateral: Secondary | ICD-10-CM

## 2020-04-05 ENCOUNTER — Telehealth: Payer: Self-pay

## 2020-04-05 MED ORDER — METOPROLOL SUCCINATE ER 50 MG PO TB24
100.0000 mg | ORAL_TABLET | Freq: Every day | ORAL | 1 refills | Status: DC
Start: 2020-04-05 — End: 2020-07-05

## 2020-04-05 NOTE — Telephone Encounter (Signed)
Per office note Dr. Darrick Huntsman did advise pt to increase metoprolol to 100 mg daily. New rx has been sent to pharmacy and pt is aware.

## 2020-04-05 NOTE — Telephone Encounter (Signed)
Pt needs metoprolol succinate (TOPROL-XL) 50 MG 24 hr tablet sent in for new dosage that her and Dr Darrick Huntsman discussed. Supposed to take two now? Please advise

## 2020-05-02 ENCOUNTER — Other Ambulatory Visit: Payer: Self-pay | Admitting: Internal Medicine

## 2020-05-13 DIAGNOSIS — U071 COVID-19: Secondary | ICD-10-CM | POA: Diagnosis not present

## 2020-05-30 ENCOUNTER — Other Ambulatory Visit: Payer: Self-pay | Admitting: Internal Medicine

## 2020-05-30 DIAGNOSIS — H9203 Otalgia, bilateral: Secondary | ICD-10-CM

## 2020-05-30 DIAGNOSIS — J309 Allergic rhinitis, unspecified: Secondary | ICD-10-CM

## 2020-05-30 DIAGNOSIS — R0982 Postnasal drip: Secondary | ICD-10-CM

## 2020-05-30 NOTE — Telephone Encounter (Signed)
RX Refill:xanax Last Seen:03-14-20 Last ordered:02-07-20

## 2020-06-03 ENCOUNTER — Encounter: Payer: Self-pay | Admitting: Family

## 2020-06-03 ENCOUNTER — Other Ambulatory Visit: Payer: Self-pay

## 2020-06-03 ENCOUNTER — Telehealth (INDEPENDENT_AMBULATORY_CARE_PROVIDER_SITE_OTHER): Payer: 59 | Admitting: Family

## 2020-06-03 VITALS — Ht 63.0 in | Wt 160.0 lb

## 2020-06-03 DIAGNOSIS — U071 COVID-19: Secondary | ICD-10-CM

## 2020-06-03 HISTORY — DX: COVID-19: U07.1

## 2020-06-03 MED ORDER — BENZONATATE 100 MG PO CAPS: 100.0000 mg | ORAL_CAPSULE | Freq: Three times a day (TID) | ORAL | 1 refills | Status: DC | PRN

## 2020-06-03 MED ORDER — ALBUTEROL SULFATE HFA 108 (90 BASE) MCG/ACT IN AERS: 2.0000 | INHALATION_SPRAY | Freq: Four times a day (QID) | RESPIRATORY_TRACT | 0 refills | Status: DC | PRN

## 2020-06-03 MED ORDER — AMOXICILLIN-POT CLAVULANATE 875-125 MG PO TABS: 1.0000 | ORAL_TABLET | Freq: Two times a day (BID) | ORAL | 0 refills | Status: AC

## 2020-06-03 NOTE — Assessment & Plan Note (Signed)
Afebrile. No coughing nor did she sound labored in her speech on phone today. Duration of symptoms 2 weeks, with improvement. Discussed secondary bacterial infection after covid, and agreed reasonable to start augmentin. Start albuterol, tessalon. Patient declines CXR at this time and will let me know right away if cough, DOE doesn't improve.

## 2020-06-03 NOTE — Progress Notes (Signed)
Verbal consent for services obtained from patient prior to services given to TELEPHONE visit:   Location of call:  provider at work patient at home  Names of all persons present for services: Rennie Plowman, NP and patient Chief complaint:  Chest congestion, cough, sob, x 2 weeks, improved.  Endorses right ear aches on and off. Cough is worse in the afternoon and at  Night. SOB with talking or walking up stairs. Slight HA. No CP, leg swelling, wheezing, nausea, vomiting, fever Tried mucinex ( plain) as made HA worse. Has been using Nyquil with some relief.   covid positive 05/12/20 Tested negative 05/26/20  and has returned to work as of last week.  No antibiotic  She has not vaccinated.  H/o HTN    A/P/next steps:  Problem List Items Addressed This Visit      Other   COVID-19 - Primary    Afebrile. No coughing nor did she sound labored in her speech on phone today. Duration of symptoms 2 weeks, with improvement. Discussed secondary bacterial infection after covid, and agreed reasonable to start augmentin. Start albuterol, tessalon. Patient declines CXR at this time and will let me know right away if cough, DOE doesn't improve.      Relevant Medications   albuterol (VENTOLIN HFA) 108 (90 Base) MCG/ACT inhaler   amoxicillin-clavulanate (AUGMENTIN) 875-125 MG tablet   benzonatate (TESSALON) 100 MG capsule       I spent 15 min  discussing plan of care over the phone.

## 2020-06-03 NOTE — Progress Notes (Signed)
Tested negative this past Sunday. Tested positive 05/12/20.  Still having chest congestion, SOB, headaches, right ear pain.

## 2020-06-12 ENCOUNTER — Other Ambulatory Visit: Payer: Self-pay | Admitting: Family

## 2020-06-12 DIAGNOSIS — U071 COVID-19: Secondary | ICD-10-CM

## 2020-06-14 ENCOUNTER — Ambulatory Visit
Admission: RE | Admit: 2020-06-14 | Discharge: 2020-06-14 | Disposition: A | Discharge status: Home / Self Care | Payer: 59 | Source: Ambulatory Visit | Attending: Internal Medicine | Admitting: Internal Medicine

## 2020-06-14 ENCOUNTER — Other Ambulatory Visit: Payer: Self-pay

## 2020-06-14 DIAGNOSIS — Z1231 Encounter for screening mammogram for malignant neoplasm of breast: Secondary | ICD-10-CM | POA: Insufficient documentation

## 2020-07-02 ENCOUNTER — Other Ambulatory Visit: Payer: Self-pay | Admitting: Internal Medicine

## 2020-07-05 ENCOUNTER — Other Ambulatory Visit: Payer: Self-pay | Admitting: Internal Medicine

## 2020-07-31 ENCOUNTER — Other Ambulatory Visit: Payer: Self-pay | Admitting: Internal Medicine

## 2020-07-31 DIAGNOSIS — J309 Allergic rhinitis, unspecified: Secondary | ICD-10-CM

## 2020-07-31 DIAGNOSIS — H9203 Otalgia, bilateral: Secondary | ICD-10-CM

## 2020-07-31 DIAGNOSIS — R0982 Postnasal drip: Secondary | ICD-10-CM

## 2020-08-07 DIAGNOSIS — R42 Dizziness and giddiness: Secondary | ICD-10-CM | POA: Diagnosis not present

## 2020-08-07 DIAGNOSIS — H9202 Otalgia, left ear: Secondary | ICD-10-CM | POA: Diagnosis not present

## 2020-09-05 ENCOUNTER — Other Ambulatory Visit: Payer: Self-pay | Admitting: Internal Medicine

## 2020-09-05 NOTE — Telephone Encounter (Signed)
RX Refill:xanax Last Seen:03-14-21 Last ordered:05-30-20

## 2020-09-05 NOTE — Telephone Encounter (Signed)
Please notify patient that the prescription  was Refilled for 30 days only because it has been 6 months since last visit and please schedule her an OFFICE VISIT

## 2020-09-11 ENCOUNTER — Telehealth: Payer: Self-pay | Admitting: Podiatry

## 2020-09-11 NOTE — Telephone Encounter (Signed)
Corrected orthotics in.. given to Dr Stacie Acres to take to [place] for pt to pick up.

## 2020-10-10 ENCOUNTER — Other Ambulatory Visit: Payer: Self-pay | Admitting: Internal Medicine

## 2020-10-10 DIAGNOSIS — H9203 Otalgia, bilateral: Secondary | ICD-10-CM

## 2020-10-10 DIAGNOSIS — R0982 Postnasal drip: Secondary | ICD-10-CM

## 2020-10-10 DIAGNOSIS — J309 Allergic rhinitis, unspecified: Secondary | ICD-10-CM

## 2020-10-18 ENCOUNTER — Telehealth: Payer: Self-pay | Admitting: Internal Medicine

## 2020-10-18 MED ORDER — METOPROLOL SUCCINATE ER 100 MG PO TB24: 100.0000 mg | ORAL_TABLET | Freq: Every day | ORAL | 1 refills | Status: DC

## 2020-10-18 NOTE — Addendum Note (Signed)
Addended by: Sherlene Shams on: 10/18/2020 01:07 PM   Modules accepted: Orders

## 2020-10-18 NOTE — Telephone Encounter (Signed)
PT called to request a refill of her metoprolol succinate (TOPROL-XL) 50 MG 24 hr tablet. She states she lost it 3 days ago and needs a refill. She also states that the Rx told her that if she gets it changed to 1 at 100mg  instead of 2 at 50mg  that insurance might cover it. [facility] told her this.

## 2020-11-08 ENCOUNTER — Telehealth: Payer: Self-pay | Admitting: Internal Medicine

## 2020-11-08 NOTE — Telephone Encounter (Signed)
RX Refill:xanax Last Seen:03-14-20 Last ordered:09-05-20

## 2020-11-11 NOTE — Telephone Encounter (Signed)
Patient returned office phone call and made an appointment for 11/12/20.

## 2020-11-11 NOTE — Telephone Encounter (Signed)
Left detailed message to inform patient of the below. Instructed patient to call back to schedule a follow up appointment to receive her medication.

## 2020-11-11 NOTE — Telephone Encounter (Signed)
Alprazolam refill denied .  May prescription carried a warning that refills required visits every 6 months  Please document that you discussed with patient

## 2020-11-12 ENCOUNTER — Ambulatory Visit: Payer: 59 | Admitting: Internal Medicine

## 2020-11-12 ENCOUNTER — Other Ambulatory Visit: Payer: Self-pay

## 2020-11-12 ENCOUNTER — Encounter: Payer: Self-pay | Admitting: Internal Medicine

## 2020-11-12 VITALS — BP 120/86 | HR 85 | Temp 97.1°F | Resp 15 | Ht 63.0 in | Wt 171.8 lb

## 2020-11-12 DIAGNOSIS — E782 Mixed hyperlipidemia: Secondary | ICD-10-CM

## 2020-11-12 DIAGNOSIS — I10 Essential (primary) hypertension: Secondary | ICD-10-CM

## 2020-11-12 DIAGNOSIS — R69 Illness, unspecified: Secondary | ICD-10-CM | POA: Diagnosis not present

## 2020-11-12 DIAGNOSIS — F411 Generalized anxiety disorder: Secondary | ICD-10-CM

## 2020-11-12 DIAGNOSIS — R7303 Prediabetes: Secondary | ICD-10-CM | POA: Diagnosis not present

## 2020-11-12 DIAGNOSIS — Z8616 Personal history of COVID-19: Secondary | ICD-10-CM | POA: Diagnosis not present

## 2020-11-12 NOTE — Progress Notes (Signed)
Subjective:  Patient ID: Vanessa Richards, female    DOB: [birth year]  Age: 60 y.o. MRN: [id number]  CC: The primary encounter diagnosis was Moderate mixed hyperlipidemia not requiring statin therapy. Diagnoses of Prediabetes, History of COVID-19, Generalized anxiety disorder, and Primary hypertension were also pertinent to this visit.  HPI KALIE CABRAL presents for follow up on prediabetes, GERD and GAD managed with alprazolam   This visit occurred during the SARS-CoV-2 public health emergency.  Safety protocols were in place, including screening questions prior to the visit, additional usage of staff PPE, and extensive cleaning of exam room while observing appropriate contact time as indicated for disinfecting solutions.   GAD:  She has been using lexapro for GAD but intermittently. . Same with alprazolam ; using it prn 1/2 tablet for insomnia   Weight gain since stopping optavia diet. .  Wants to resume the diet,  not exercising regularly.  Hypertension: patient checks blood pressure twice weekly at home.  Readings have been for the most part < 140/80 at rest . Patient is following a reduce salt diet most days and is taking medications as prescribed: maxzide and metoprolol.    Outpatient Medications Prior to Visit  Medication Sig Dispense Refill   fluticasone (FLONASE) 50 MCG/ACT nasal spray Place 2 sprays into both nostrils daily. (Patient taking differently: Place 2 sprays into both nostrils as needed.) 16 g 5   loratadine (CLARITIN) 10 MG tablet TAKE ONE TABLET BY MOUTH ONCE DAILY 30 tablet 1   meloxicam (MOBIC) 15 MG tablet TAKE ONE TABLET BY MOUTH ONCE DAILY (Patient taking differently: Take 15 mg by mouth as needed.) 30 tablet 5   metoprolol succinate (TOPROL-XL) 100 MG 24 hr tablet Take 1 tablet (100 mg total) by mouth daily. Take with or immediately following a meal. 90 tablet 1   omeprazole (PRILOSEC) 20 MG capsule TAKE ONE CAPSULE BY MOUTH ONCE DAILY 30 capsule 1    triamterene-hydrochlorothiazide (DYAZIDE) 37.5-25 MG capsule TAKE ONE CAPSULE BY MOUTH EVERY MORNING 30 capsule 1   ALPRAZolam (XANAX) 0.5 MG tablet TAKE ONE TABLET BY MOUTH ONCE DAILY AS NEEDED FOR ANXIETY OR INSOMNIA 30 tablet 0   escitalopram (LEXAPRO) 5 MG tablet TAKE ONE TABLET BY MOUTH ONCE DAILY 30 tablet 1   hydrochlorothiazide (HYDRODIURIL) 25 MG tablet Take 25 mg by mouth daily.     albuterol (VENTOLIN HFA) 108 (90 Base) MCG/ACT inhaler Inhale 2 puffs into the lungs every 6 (six) hours as needed for wheezing or shortness of breath. (Patient not taking: Reported on 11/12/2020) 8 g 0   benzonatate (TESSALON) 100 MG capsule TAKE ONE CAPSULE BY MOUTH THREE TIMES DAILY AS NEEDED FOR COUGH (Patient not taking: Reported on 11/12/2020) 30 capsule 1   No facility-administered medications prior to visit.    Review of Systems;  Patient denies headache, fevers, malaise, unintentional weight loss, skin rash, eye pain, sinus congestion and sinus pain, sore throat, dysphagia,  hemoptysis , cough, dyspnea, wheezing, chest pain, palpitations, orthopnea, edema, abdominal pain, nausea, melena, diarrhea, constipation, flank pain, dysuria, hematuria, urinary  Frequency, nocturia, numbness, tingling, seizures,  Focal weakness, Loss of consciousness,  Tremor, insomnia, depression, anxiety, and suicidal ideation.      Objective:  BP 120/86 (BP Location: Left Arm, Patient Position: Sitting, Cuff Size: Large)   Pulse 85   Temp (!) 97.1 F (36.2 C) (Temporal)   Resp 15   Ht 5\' 3"  (1.6 m)   Wt 171 lb 12.8 oz (77.9 kg)  SpO2 98%   BMI 30.43 kg/m   BP Readings from Last 3 Encounters:  11/12/20 120/86  03/14/20 (!) 156/90  12/08/19 (!) 144/92    Wt Readings from Last 3 Encounters:  11/12/20 171 lb 12.8 oz (77.9 kg)  06/03/20 160 lb (72.6 kg)  03/14/20 161 lb (73 kg)    General appearance: alert, cooperative and appears stated age Ears: normal TM's and external ear canals both ears Throat: lips,  mucosa, and tongue normal; teeth and gums normal Neck: no adenopathy, no carotid bruit, supple, symmetrical, trachea midline and thyroid not enlarged, symmetric, no tenderness/mass/nodules Back: symmetric, no curvature. ROM normal. No CVA tenderness. Lungs: clear to auscultation bilaterally Heart: regular rate and rhythm, S1, S2 normal, no murmur, click, rub or gallop Abdomen: soft, non-tender; bowel sounds normal; no masses,  no organomegaly Pulses: 2+ and symmetric Skin: Skin color, texture, turgor normal. No rashes or lesions Lymph nodes: Cervical, supraclavicular, and axillary nodes normal.  Lab Results  Component Value Date   HGBA1C 5.6 11/12/2020   HGBA1C 5.6 03/14/2020   HGBA1C 5.4 12/08/2019    Lab Results  Component Value Date   CREATININE 0.89 11/12/2020   CREATININE 0.87 03/14/2020   CREATININE 0.78 12/08/2019    Lab Results  Component Value Date   WBC 7.2 02/03/2017   HGB 13.4 02/03/2017   HCT 39.6 02/03/2017   PLT 293.0 02/03/2017   GLUCOSE 90 11/12/2020   CHOL 205 (H) 11/12/2020   TRIG 387.0 (H) 11/12/2020   HDL 53.50 11/12/2020   LDLDIRECT 101.0 11/12/2020   LDLCALC 113 (H) 12/08/2019   ALT 43 (H) 11/12/2020   AST 41 (H) 11/12/2020   NA 138 11/12/2020   K 3.5 11/12/2020   CL 102 11/12/2020   CREATININE 0.89 11/12/2020   BUN 16 11/12/2020   CO2 27 11/12/2020   TSH 1.90 12/08/2019   HGBA1C 5.6 11/12/2020   MICROALBUR <0.7 03/14/2020    MM 3D SCREEN BREAST BILATERAL  Result Date: 06/19/2020 CLINICAL DATA:  Screening. EXAM: DIGITAL SCREENING BILATERAL MAMMOGRAM WITH TOMOSYNTHESIS AND CAD TECHNIQUE: Bilateral screening digital craniocaudal and mediolateral oblique mammograms were obtained. Bilateral screening digital breast tomosynthesis was performed. The images were evaluated with computer-aided detection. COMPARISON:  Previous exam(s). ACR Breast Density Category c: The breast tissue is heterogeneously dense, which may obscure small masses. FINDINGS:  There are no findings suspicious for malignancy. IMPRESSION: No mammographic evidence of malignancy. A result letter of this screening mammogram will be mailed directly to the patient. RECOMMENDATION: Screening mammogram in one year. (Code:SM-B-01Y) BI-RADS CATEGORY  1: Negative. Electronically Signed   By: Hulan Saas M.D.   On: 06/19/2020 13:13    Assessment & Plan:   Problem List Items Addressed This Visit       Unprioritized   Generalized anxiety disorder    Improved due to decreased in caregiver burden imposed on her by husband and his family.  She has been advised to take  5 mg lexapro more consistently and limit the use of  Alprazolam to  prn.  The risks and benefits of benzodiazepine use were discussed with patient today including excessive sedation leading to respiratory depression,  impaired thinking/driving, and addiction.  Patient was advised to avoid concurrent use with alcohol, to use medication only as needed and not to share with others  .        Relevant Medications   ALPRAZolam (XANAX) 0.5 MG tablet   Hyperlipidemia - Primary    iMPROVED WITH a low glycemic index  diet, weight  loss and regular exercise.  10 yr risk using the [facility] calculator is < 5%  Lab Results  Component Value Date   CHOL 205 (H) 11/12/2020   HDL 53.50 11/12/2020   LDLCALC 113 (H) 12/08/2019   LDLDIRECT 101.0 11/12/2020   TRIG 387.0 (H) 11/12/2020   CHOLHDL 4 11/12/2020   Lab Results  Component Value Date   ALT 43 (H) 11/12/2020   AST 41 (H) 11/12/2020   ALKPHOS 74 11/12/2020   BILITOT 0.3 11/12/2020          Relevant Orders   Lipid panel (Completed)   LDL cholesterol, direct (Completed)   Hypertension    Improved control with  toprol dose increased  100 mg daily starting today.         Prediabetes   Relevant Orders   Hemoglobin A1c (Completed)   Comprehensive metabolic panel (Completed)   Other Visit Diagnoses     History of COVID-19       Relevant Orders   SARS-CoV-2  Semi-Quantitative Total Antibody, Spike       I have discontinued Marieme A. Plotz's albuterol, benzonatate, escitalopram, and hydrochlorothiazide. I am also having her maintain her fluticasone, meloxicam, triamterene-hydrochlorothiazide, omeprazole, loratadine, metoprolol succinate, and ALPRAZolam.  Meds ordered this encounter  Medications   DISCONTD: ALPRAZolam (XANAX) 0.5 MG tablet    Sig: Take 1 tablet (0.5 mg total) by mouth at bedtime as needed for anxiety.    Dispense:  30 tablet    Refill:  5   ALPRAZolam (XANAX) 0.5 MG tablet    Sig: Take 1 tablet (0.5 mg total) by mouth at bedtime as needed for anxiety.    Dispense:  30 tablet    Refill:  5     Medications Discontinued During This Encounter  Medication Reason   albuterol (VENTOLIN HFA) 108 (90 Base) MCG/ACT inhaler    benzonatate (TESSALON) 100 MG capsule    escitalopram (LEXAPRO) 5 MG tablet    ALPRAZolam (XANAX) 0.5 MG tablet Reorder   hydrochlorothiazide (HYDRODIURIL) 25 MG tablet    ALPRAZolam (XANAX) 0.5 MG tablet Reorder    Follow-up: Return in about 6 months (around 05/15/2021).   Sherlene Shams, MD

## 2020-11-12 NOTE — Patient Instructions (Signed)
You have gained 11 lb since your last visit!  The key to preventing progression to diabetes is exercise and weight loss.

## 2020-11-13 LAB — LIPID PANEL
Cholesterol: 205 mg/dL — ABNORMAL HIGH (ref 0–200)
HDL: 53.5 mg/dL (ref >39.00)
NonHDL: 151.91
Total CHOL/HDL Ratio: 4
Triglycerides: 387 mg/dL — ABNORMAL HIGH (ref 0.0–149.0)
VLDL: 77.4 mg/dL — ABNORMAL HIGH (ref 0.0–40.0)

## 2020-11-13 LAB — COMPREHENSIVE METABOLIC PANEL
ALT: 43 U/L — ABNORMAL HIGH (ref 0–35)
AST: 41 U/L — ABNORMAL HIGH (ref 0–37)
Albumin: 4.5 g/dL (ref 3.5–5.2)
Alkaline Phosphatase: 74 U/L (ref 39–117)
BUN: 16 mg/dL (ref 6–23)
CO2: 27 mEq/L (ref 19–32)
Calcium: 9.6 mg/dL (ref 8.4–10.5)
Chloride: 102 mEq/L (ref 96–112)
Creatinine, Ser: 0.89 mg/dL (ref 0.40–1.20)
GFR: 70.81 mL/min (ref >60.00)
Glucose, Bld: 90 mg/dL (ref 70–99)
Potassium: 3.5 mEq/L (ref 3.5–5.1)
Sodium: 138 mEq/L (ref 135–145)
Total Bilirubin: 0.3 mg/dL (ref 0.2–1.2)
Total Protein: 7.4 g/dL (ref 6.0–8.3)

## 2020-11-13 LAB — HEMOGLOBIN A1C: Hgb A1c MFr Bld: 5.6 % (ref 4.6–6.5)

## 2020-11-13 LAB — LDL CHOLESTEROL, DIRECT: Direct LDL: 101 mg/dL

## 2020-11-13 MED ORDER — ALPRAZOLAM 0.5 MG PO TABS: 0.5000 mg | ORAL_TABLET | Freq: Every evening | ORAL | 5 refills | Status: DC | PRN

## 2020-11-13 NOTE — Assessment & Plan Note (Signed)
Improved due to decreased in caregiver burden imposed on her by husband and his family.  She has been advised to take  5 mg lexapro more consistently and limit the use of  Alprazolam to  prn.  The risks and benefits of benzodiazepine use were discussed with patient today including excessive sedation leading to respiratory depression,  impaired thinking/driving, and addiction.  Patient was advised to avoid concurrent use with alcohol, to use medication only as needed and not to share with others  .

## 2020-11-13 NOTE — Assessment & Plan Note (Signed)
iMPROVED WITH a low glycemic index diet, weight  loss and regular exercise.  10 yr risk using the [facility] calculator is < 5%  Lab Results  Component Value Date   CHOL 205 (H) 11/12/2020   HDL 53.50 11/12/2020   LDLCALC 113 (H) 12/08/2019   LDLDIRECT 101.0 11/12/2020   TRIG 387.0 (H) 11/12/2020   CHOLHDL 4 11/12/2020   Lab Results  Component Value Date   ALT 43 (H) 11/12/2020   AST 41 (H) 11/12/2020   ALKPHOS 74 11/12/2020   BILITOT 0.3 11/12/2020

## 2020-11-13 NOTE — Assessment & Plan Note (Signed)
Improved control with  toprol dose increased  100 mg daily starting today.

## 2020-11-16 LAB — SARS-COV-2 SEMI-QUANTITATIVE TOTAL ANTIBODY, SPIKE: SARS COV2 AB, Total Spike Semi QN: 32.6 U/mL — ABNORMAL HIGH (ref <0.8)

## 2020-11-27 ENCOUNTER — Telehealth: Payer: Self-pay | Admitting: *Deleted

## 2020-11-27 NOTE — Telephone Encounter (Signed)
Please place future orders for lab appt.  

## 2020-11-28 ENCOUNTER — Other Ambulatory Visit (INDEPENDENT_AMBULATORY_CARE_PROVIDER_SITE_OTHER): Payer: 59

## 2020-11-28 ENCOUNTER — Telehealth: Payer: Self-pay

## 2020-11-28 ENCOUNTER — Telehealth: Payer: Self-pay | Admitting: Internal Medicine

## 2020-11-28 ENCOUNTER — Other Ambulatory Visit: Payer: Self-pay

## 2020-11-28 DIAGNOSIS — R7303 Prediabetes: Secondary | ICD-10-CM

## 2020-11-28 LAB — COMPREHENSIVE METABOLIC PANEL
ALT: 21 U/L (ref 0–35)
AST: 26 U/L (ref 0–37)
Albumin: 4.2 g/dL (ref 3.5–5.2)
Alkaline Phosphatase: 78 U/L (ref 39–117)
BUN: 12 mg/dL (ref 6–23)
CO2: 30 mEq/L (ref 19–32)
Calcium: 9.5 mg/dL (ref 8.4–10.5)
Chloride: 99 mEq/L (ref 96–112)
Creatinine, Ser: 0.83 mg/dL (ref 0.40–1.20)
GFR: 76.97 mL/min (ref >60.00)
Glucose, Bld: 83 mg/dL (ref 70–99)
Potassium: 3.9 mEq/L (ref 3.5–5.1)
Sodium: 137 mEq/L (ref 135–145)
Total Bilirubin: 0.4 mg/dL (ref 0.2–1.2)
Total Protein: 7.2 g/dL (ref 6.0–8.3)

## 2020-11-28 NOTE — Telephone Encounter (Signed)
Pt came in for labs an wanted to let Dr. Darrick Huntsman know that she has been having hip pain for the last 70m. She would like a referral to ortho

## 2020-11-28 NOTE — Telephone Encounter (Signed)
Pt came in for a 2 week recheck on blood work for elevate liver enzymes. Future labs were not placed, ordered a Repeat CMP for patient to check on Liver Enzymes. The lab also drew a lavender tube for additional testing. Are there any other labs that you would like ordered for the patient?

## 2020-11-28 NOTE — Telephone Encounter (Signed)
Spoke with pt and advised her that she could go to the walk-in at [facility] between 1pm - 7pm. Pt gave a verbal understanding and stated that is what she will do.

## 2020-12-02 ENCOUNTER — Telehealth: Payer: Self-pay | Admitting: Internal Medicine

## 2020-12-02 NOTE — Telephone Encounter (Signed)
Patient is returning your call about her lab results. 

## 2020-12-03 NOTE — Telephone Encounter (Signed)
See result note message 

## 2020-12-20 ENCOUNTER — Other Ambulatory Visit: Payer: Self-pay | Admitting: Internal Medicine

## 2020-12-20 DIAGNOSIS — R0982 Postnasal drip: Secondary | ICD-10-CM

## 2020-12-20 DIAGNOSIS — H9203 Otalgia, bilateral: Secondary | ICD-10-CM

## 2020-12-20 DIAGNOSIS — J309 Allergic rhinitis, unspecified: Secondary | ICD-10-CM

## 2021-01-02 ENCOUNTER — Other Ambulatory Visit: Payer: Self-pay | Admitting: Internal Medicine

## 2021-01-14 ENCOUNTER — Other Ambulatory Visit: Payer: Self-pay | Admitting: Internal Medicine

## 2021-01-15 ENCOUNTER — Other Ambulatory Visit: Payer: Self-pay | Admitting: Internal Medicine

## 2021-03-13 ENCOUNTER — Other Ambulatory Visit: Payer: Self-pay | Admitting: Internal Medicine

## 2021-03-13 DIAGNOSIS — J309 Allergic rhinitis, unspecified: Secondary | ICD-10-CM

## 2021-03-13 DIAGNOSIS — R0982 Postnasal drip: Secondary | ICD-10-CM

## 2021-03-13 DIAGNOSIS — H9203 Otalgia, bilateral: Secondary | ICD-10-CM

## 2021-03-14 ENCOUNTER — Encounter: Payer: 59 | Admitting: Internal Medicine

## 2021-05-13 ENCOUNTER — Other Ambulatory Visit: Payer: Self-pay | Admitting: Internal Medicine

## 2021-05-16 ENCOUNTER — Other Ambulatory Visit: Payer: Self-pay

## 2021-05-16 ENCOUNTER — Encounter: Payer: Self-pay | Admitting: Internal Medicine

## 2021-05-16 ENCOUNTER — Ambulatory Visit (INDEPENDENT_AMBULATORY_CARE_PROVIDER_SITE_OTHER): Payer: 59 | Admitting: Internal Medicine

## 2021-05-16 VITALS — BP 122/86 | HR 101 | Temp 98.5°F | Ht 62.99 in | Wt 176.6 lb

## 2021-05-16 DIAGNOSIS — R5383 Other fatigue: Secondary | ICD-10-CM

## 2021-05-16 DIAGNOSIS — L409 Psoriasis, unspecified: Secondary | ICD-10-CM | POA: Diagnosis not present

## 2021-05-16 DIAGNOSIS — Z124 Encounter for screening for malignant neoplasm of cervix: Secondary | ICD-10-CM

## 2021-05-16 DIAGNOSIS — R7303 Prediabetes: Secondary | ICD-10-CM

## 2021-05-16 DIAGNOSIS — E049 Nontoxic goiter, unspecified: Secondary | ICD-10-CM | POA: Diagnosis not present

## 2021-05-16 DIAGNOSIS — I10 Essential (primary) hypertension: Secondary | ICD-10-CM

## 2021-05-16 DIAGNOSIS — R29818 Other symptoms and signs involving the nervous system: Secondary | ICD-10-CM

## 2021-05-16 LAB — TSH: TSH: 3.48 mIU/L (ref 0.40–4.50)

## 2021-05-16 NOTE — Progress Notes (Deleted)
Patient ID: Vanessa Richards, female    DOB: 1960-10-04  Age: 61 y.o. MRN: [id number]  The patient is here for annual preventive examination but this will need to be resceduled due ot multple new issues that were discussed today    The risk factors are reflected in the social history.  The roster of all physicians providing medical care to patient - is listed in the Snapshot section of the chart.  Activities of daily living:  The patient is 100% independent in all ADLs: dressing, toileting, feeding as well as independent mobility  Home safety : The patient has smoke detectors in the home. They wear seatbelts.  There are no firearms at home. There is no violence in the home.   There is no risks for hepatitis, STDs or HIV. There is no   history of blood transfusion. They have no travel history to infectious disease endemic areas of the world.  The patient has seen their dentist in the last six month. They have seen their eye doctor in the last year. They admit to slight hearing difficulty with regard to whispered voices and some television programs.  They have deferred audiologic testing in the last year.  They do not  have excessive sun exposure. Discussed the need for sun protection: hats, long sleeves and use of sunscreen if there is significant sun exposure.   Diet: the importance of a healthy diet is discussed. They do have a healthy diet.  The benefits of regular aerobic exercise were discussed. She walks 4 times per week ,  20 minutes.   Depression screen: there are no signs or vegative symptoms of depression- irritability, change in appetite, anhedonia, sadness/tearfullness.  Cognitive assessment: the patient manages all their financial and personal affairs and is actively engaged. They could relate day,date,year and events; recalled 2/3 objects at 3 minutes; performed clock-face test normally.  The following portions of the patient's history were reviewed and updated as appropriate:  allergies, current medications, past family history, past medical history,  past surgical history, past social history  and problem list.  Visual acuity was not assessed per patient preference since she has regular follow up with her ophthalmologist. Hearing and body mass index were assessed and reviewed.   During the course of the visit the patient was educated and counseled about appropriate screening and preventive services including : fall prevention , diabetes screening, nutrition counseling, colorectal cancer screening, and recommended immunizations.    CC: The primary encounter diagnosis was Screening for cervical cancer. Diagnoses of Primary hypertension, Prediabetes, and Other fatigue were also pertinent to this visit.  1) persistent eczema of scalp. Has not colored hair in a year because of  Flaking and itching .  Scalp burns when shampooing.  Has tried tea tree oil.  Has not seen dermatology yet.  April is the earliest appt   2) transient neurologic symptoms lst week.  Right hand and right foot,  heralded by a petechial rash on right dorsal surface of right hand.  Right hand felt clumsy for several days  and right foot was also clumsy. All symptoms resolved after 3 days    History Blessings has a past medical history of GERD (gastroesophageal reflux disease), Hyperlipidemia, Hypertension, and Migraine syndrome.   She has a past surgical history that includes Dilation and curettage of uterus; Tubal ligation; Colonoscopy with propofol (N/A, 03/15/2015); and Esophagogastroduodenoscopy (egd) with propofol (N/A, 03/15/2015).   Her family history includes Arthritis (age of onset: 23) in her sister; Early  death in her paternal uncle; Heart disease (age of onset: 1) in her paternal grandfather; Heart disease (age of onset: 62) in her father; Hypertension in her father.She reports that she has never smoked. She has never used smokeless tobacco. She reports current alcohol use of about 2.0 standard  drinks per week. She reports that she does not use drugs.  Outpatient Medications Prior to Visit  Medication Sig Dispense Refill   ALPRAZolam (XANAX) 0.5 MG tablet Take 1 tablet (0.5 mg total) by mouth at bedtime as needed for anxiety. 30 tablet 5   loratadine (CLARITIN) 10 MG tablet TAKE ONE TABLET BY MOUTH ONCE DAILY 30 tablet 1   meloxicam (MOBIC) 15 MG tablet TAKE ONE TABLET BY MOUTH ONCE DAILY 30 tablet 5   metoprolol succinate (TOPROL-XL) 100 MG 24 hr tablet TAKE ONE TABLET BY MOUTH ONCE DAILY. TAKE WITH OR IMMEDIATELY FOLLOWING A MEAL 90 tablet 1   omeprazole (PRILOSEC) 20 MG capsule TAKE ONE CAPSULE BY MOUTH ONCE DAILY 30 capsule 1   triamterene-hydrochlorothiazide (DYAZIDE) 37.5-25 MG capsule TAKE ONE CAPSULE BY MOUTH EVERY MORNING 30 capsule 1   fluticasone (FLONASE) 50 MCG/ACT nasal spray Place 2 sprays into both nostrils daily. (Patient not taking: Reported on 05/16/2021) 16 g 5   No facility-administered medications prior to visit.    Review of Systems  Patient denies headache, fevers, malaise, unintentional weight loss, skin rash, eye pain, sinus congestion and sinus pain, sore throat, dysphagia,  hemoptysis , cough, dyspnea, wheezing, chest pain, palpitations, orthopnea, edema, abdominal pain, nausea, melena, diarrhea, constipation, flank pain, dysuria, hematuria, urinary  Frequency, nocturia, numbness, tingling, seizures,  Focal weakness, Loss of consciousness,  Tremor, insomnia, depression, anxiety, and suicidal ideation.     Objective:  BP 122/86 (BP Location: Left Arm, Patient Position: Sitting, Cuff Size: Large)    Pulse (!) 101    Temp 98.5 F (36.9 C) (Oral)    Ht 5' 2.99" (1.6 m)    Wt 176 lb 9.6 oz (80.1 kg)    SpO2 97%    BMI 31.29 kg/m   Physical Exam   General appearance: alert, cooperative and appears stated age Head: Normocephalic, without obvious abnormality, atraumatic Eyes: conjunctivae/corneas clear. PERRL, EOM's intact. Fundi benign. Ears: normal TM's  and external ear canals both ears Nose: Nares normal. Septum midline. Mucosa normal. No drainage or sinus tenderness. Throat: lips, mucosa, and tongue normal; teeth and gums normal Neck: no adenopathy, no carotid bruit, no JVD, supple, symmetrical, trachea midline and thyroid  enlarged, symmetric, no tenderness/mass/nodules Lungs: clear to auscultation bilaterally Breasts: normal appearance, no masses or tenderness Heart: regular rate and rhythm, S1, S2 normal, no murmur, click, rub or gallop Abdomen: soft, non-tender; bowel sounds normal; no masses,  no organomegaly Extremities: extremities normal, atraumatic, no cyanosis or edema Pulses: 2+ and symmetric Skin: Skin color, texture, turgor normal. No rashes or lesions Neurologic: Alert and oriented X 3, normal strength and tone. Normal symmetric reflexes. Normal coordination and gait.    Assessment & Plan:   Problem List Items Addressed This Visit     Hypertension   Relevant Orders   Comp Met (CMET)   Urine Microalbumin w/creat. ratio   Prediabetes   Relevant Orders   Comp Met (CMET)   HgB A1c   Other Visit Diagnoses     Screening for cervical cancer    -  Primary   Relevant Orders   Cytology - PAP( [place])   Other fatigue       Relevant  Orders   TSH   CBC with Differential/Platelet       I am having Almeta A. Widen maintain her fluticasone, ALPRAZolam, meloxicam, triamterene-hydrochlorothiazide, omeprazole, loratadine, and metoprolol succinate.  No orders of the defined types were placed in this encounter.   There are no discontinued medications.  Follow-up: No follow-ups on file.   Crecencio Mc, MD

## 2021-05-16 NOTE — Patient Instructions (Addendum)
You may be due for colonoscopy this year  We are requesting records from [facility]  I recommend starting a baby aspirin (81) mg daily  and will prescribe a cholesterol medication once I see your cholesterol results   I will call in a medication for your scalp once I have reviewed the literature  Thyroid ultrasound ordered   Please reschedule your physical

## 2021-05-17 DIAGNOSIS — R29818 Other symptoms and signs involving the nervous system: Secondary | ICD-10-CM | POA: Insufficient documentation

## 2021-05-17 DIAGNOSIS — E049 Nontoxic goiter, unspecified: Secondary | ICD-10-CM | POA: Insufficient documentation

## 2021-05-17 DIAGNOSIS — L409 Psoriasis, unspecified: Secondary | ICD-10-CM | POA: Insufficient documentation

## 2021-05-17 LAB — COMPLETE METABOLIC PANEL WITH GFR
AG Ratio: 1.4 (calc) (ref 1.0–2.5)
ALT: 14 U/L (ref 6–29)
AST: 15 U/L (ref 10–35)
Albumin: 4.4 g/dL (ref 3.6–5.1)
Alkaline phosphatase (APISO): 95 U/L (ref 37–153)
BUN: 15 mg/dL (ref 7–25)
CO2: 29 mmol/L (ref 20–32)
Calcium: 9.8 mg/dL (ref 8.6–10.4)
Chloride: 103 mmol/L (ref 98–110)
Creat: 0.96 mg/dL (ref 0.50–1.05)
Globulin: 3.2 g/dL (calc) (ref 1.9–3.7)
Glucose, Bld: 120 mg/dL — ABNORMAL HIGH (ref 65–99)
Potassium: 3.9 mmol/L (ref 3.5–5.3)
Sodium: 142 mmol/L (ref 135–146)
Total Bilirubin: 0.2 mg/dL (ref 0.2–1.2)
Total Protein: 7.6 g/dL (ref 6.1–8.1)
eGFR: 68 mL/min/{1.73_m2} (ref >=60)

## 2021-05-17 LAB — MICROALBUMIN / CREATININE URINE RATIO
Creatinine, Urine: 109 mg/dL (ref 20–275)
Microalb Creat Ratio: 6 mcg/mg creat (ref <30)
Microalb, Ur: 0.6 mg/dL

## 2021-05-17 LAB — CBC WITH DIFFERENTIAL/PLATELET
Absolute Monocytes: 516 cells/uL (ref 200–950)
Basophils Absolute: 77 cells/uL (ref 0–200)
Basophils Relative: 0.9 %
Eosinophils Absolute: 378 cells/uL (ref 15–500)
Eosinophils Relative: 4.4 %
HCT: 40.1 % (ref 35.0–45.0)
Hemoglobin: 13.7 g/dL (ref 11.7–15.5)
Lymphs Abs: 2649 cells/uL (ref 850–3900)
MCH: 30.2 pg (ref 27.0–33.0)
MCHC: 34.2 g/dL (ref 32.0–36.0)
MCV: 88.5 fL (ref 80.0–100.0)
MPV: 10.1 fL (ref 7.5–12.5)
Monocytes Relative: 6 %
Neutro Abs: 4979 cells/uL (ref 1500–7800)
Neutrophils Relative %: 57.9 %
Platelets: 324 10*3/uL (ref 140–400)
RBC: 4.53 10*6/uL (ref 3.80–5.10)
RDW: 12.7 % (ref 11.0–15.0)
Total Lymphocyte: 30.8 %
WBC: 8.6 10*3/uL (ref 3.8–10.8)

## 2021-05-17 LAB — HEMOGLOBIN A1C
Hgb A1c MFr Bld: 5.7 % of total Hgb — ABNORMAL HIGH (ref <5.7)
Mean Plasma Glucose: 117 mg/dL
eAG (mmol/L): 6.5 mmol/L

## 2021-05-17 MED ORDER — BETAMETHASONE DIPROPIONATE 0.05 % EX CREA: TOPICAL_CREAM | Freq: Two times a day (BID) | CUTANEOUS | 0 refills | Status: DC

## 2021-05-17 NOTE — Progress Notes (Signed)
Subjective:  Patient ID: Vanessa Richards, female    DOB: 03-06-1961  Age: 61 y.o. MRN: [id number]  CC: The primary encounter diagnosis was Screening for cervical cancer. Diagnoses of Primary hypertension, Prediabetes, Other fatigue, Thyroid enlargement, Scalp psoriasis, and Transient neurological symptoms were also pertinent to this visit.   This visit occurred during the SARS-CoV-2 public health emergency.  Safety protocols were in place, including screening questions prior to the visit, additional usage of staff PPE, and extensive cleaning of exam room while observing appropriate contact time as indicated for disinfecting solutions.    HPI Vanessa Richards presents for  Chief Complaint  Patient presents with   Annual Exam    Physical with a pap smear   CPE deferred due to multiple issues raised during exam    1) persistent itchy flaking dermatitis presumed to be eczema of scalp. Has not colored hair in a year because of  Flaking and itching .  Scalp burns when shampooing.  Has tried tea tree oil.  Has not seen dermatology yet.  April is the earliest appt   started at nape of neck but has spread.    2) transient neurologic symptoms last week.  Right hand and right foot both became weak and clumsy,   heralded by a petechial rash  that occurredon the dorsum of hr right hand over the radial side  kept dropping things , scraped the dial side of right foot in shower on her way out because she didn't lift it high enough to clear the track of the door.  States that all symptoms resolved after 3 days . Thought she might be having a stroke but did ot go to ER   3) HTN:  Patient is taking her medications as prescribed and notes no adverse effects.  Home BP readings have been done about once per week and are  generally < 130/80 .  She is avoiding added salt in her diet .   Marland Kitchen    Outpatient Medications Prior to Visit  Medication Sig Dispense Refill   ALPRAZolam (XANAX) 0.5 MG tablet Take 1 tablet  (0.5 mg total) by mouth at bedtime as needed for anxiety. 30 tablet 5   loratadine (CLARITIN) 10 MG tablet TAKE ONE TABLET BY MOUTH ONCE DAILY 30 tablet 1   meloxicam (MOBIC) 15 MG tablet TAKE ONE TABLET BY MOUTH ONCE DAILY 30 tablet 5   metoprolol succinate (TOPROL-XL) 100 MG 24 hr tablet TAKE ONE TABLET BY MOUTH ONCE DAILY. TAKE WITH OR IMMEDIATELY FOLLOWING A MEAL 90 tablet 1   omeprazole (PRILOSEC) 20 MG capsule TAKE ONE CAPSULE BY MOUTH ONCE DAILY 30 capsule 1   triamterene-hydrochlorothiazide (DYAZIDE) 37.5-25 MG capsule TAKE ONE CAPSULE BY MOUTH EVERY MORNING 30 capsule 1   fluticasone (FLONASE) 50 MCG/ACT nasal spray Place 2 sprays into both nostrils daily. (Patient not taking: Reported on 05/16/2021) 16 g 5   No facility-administered medications prior to visit.    Review of Systems;  Patient denies headache, fevers, malaise, unintentional weight loss, skin rash, eye pain, sinus congestion and sinus pain, sore throat, dysphagia,  hemoptysis , cough, dyspnea, wheezing, chest pain, palpitations, orthopnea, edema, abdominal pain, nausea, melena, diarrhea, constipation, flank pain, dysuria, hematuria, urinary  Frequency, nocturia, numbness, tingling, seizures,  Focal weakness, Loss of consciousness,  Tremor, insomnia, depression, anxiety, and suicidal ideation.      Objective:  BP 122/86 (BP Location: Left Arm, Patient Position: Sitting, Cuff Size: Large)    Pulse (!) 101  Temp 98.5 F (36.9 C) (Oral)    Ht 5' 2.99" (1.6 m)    Wt 176 lb 9.6 oz (80.1 kg)    SpO2 97%    BMI 31.29 kg/m   BP Readings from Last 3 Encounters:  05/16/21 122/86  11/12/20 120/86  03/14/20 (!) 156/90    Wt Readings from Last 3 Encounters:  05/16/21 176 lb 9.6 oz (80.1 kg)  11/12/20 171 lb 12.8 oz (77.9 kg)  06/03/20 160 lb (72.6 kg)    General appearance: alert, cooperative and appears stated age Ears: normal TM's and external ear canals both ears Throat: lips, mucosa, and tongue normal; teeth and gums  normal Scalp:  dense thick silvery placque at base of neck covering lower third of occipital region  Neck: no adenopathy, no carotid bruit, supple, symmetrical, trachea midline.   thyroid  with symmetric bilaetral enlargement  no tenderness/mass/nodules Back: symmetric, no curvature. ROM normal. No CVA tenderness. Lungs: clear to auscultation bilaterally Heart: regular rate and rhythm, S1, S2 normal, no murmur, click, rub or gallop Abdomen: soft, non-tender; bowel sounds normal; no masses,  no organomegaly Pulses: 2+ and symmetric Skin: Skin color, texture, turgor normal. No rashes or lesions Lymph nodes: Cervical, supraclavicular, and axillary nodes normal. Neuro: CNs 2-12 intact. DTRs 2+/4 in biceps, brachioradialis, patellars and achilles. Muscle strength 5/5 in upper and lower exremities. Fine resting tremor bilaterally both hands cerebellar function normal. Romberg negative.  No pronator drift.   Gait normal.    Lab Results  Component Value Date   HGBA1C 5.7 (H) 05/16/2021   HGBA1C 5.6 11/12/2020   HGBA1C 5.6 03/14/2020    Lab Results  Component Value Date   CREATININE 0.96 05/16/2021   CREATININE 0.83 11/28/2020   CREATININE 0.89 11/12/2020    Lab Results  Component Value Date   WBC 8.6 05/16/2021   HGB 13.7 05/16/2021   HCT 40.1 05/16/2021   PLT 324 05/16/2021   GLUCOSE 120 (H) 05/16/2021   CHOL 205 (H) 11/12/2020   TRIG 387.0 (H) 11/12/2020   HDL 53.50 11/12/2020   LDLDIRECT 101.0 11/12/2020   LDLCALC 113 (H) 12/08/2019   ALT 14 05/16/2021   AST 15 05/16/2021   NA 142 05/16/2021   K 3.9 05/16/2021   CL 103 05/16/2021   CREATININE 0.96 05/16/2021   BUN 15 05/16/2021   CO2 29 05/16/2021   TSH 3.48 05/16/2021   HGBA1C 5.7 (H) 05/16/2021   MICROALBUR 0.6 05/16/2021    MM 3D SCREEN BREAST BILATERAL  Result Date: 06/19/2020 CLINICAL DATA:  Screening. EXAM: DIGITAL SCREENING BILATERAL MAMMOGRAM WITH TOMOSYNTHESIS AND CAD TECHNIQUE: Bilateral screening digital  craniocaudal and mediolateral oblique mammograms were obtained. Bilateral screening digital breast tomosynthesis was performed. The images were evaluated with computer-aided detection. COMPARISON:  Previous exam(s). ACR Breast Density Category c: The breast tissue is heterogeneously dense, which may obscure small masses. FINDINGS: There are no findings suspicious for malignancy. IMPRESSION: No mammographic evidence of malignancy. A result letter of this screening mammogram will be mailed directly to the patient. RECOMMENDATION: Screening mammogram in one year. (Code:SM-B-01Y) BI-RADS CATEGORY  1: Negative. Electronically Signed   By: Evangeline Dakin M.D.   On: 06/19/2020 13:13    Assessment & Plan:   Problem List Items Addressed This Visit     Hypertension    Well controlled on current regimen. Renal function stable, no changes today.  Lab Results  Component Value Date   CREATININE 0.96 05/16/2021   Lab Results  Component Value Date  NA 142 05/16/2021   K 3.9 05/16/2021   CL 103 05/16/2021   CO2 29 05/16/2021         Relevant Orders   Urine Microalbumin w/creat. ratio (Completed)   COMPLETE METABOLIC PANEL WITH GFR (Completed)   Prediabetes   Relevant Orders   HgB A1c (Completed)   COMPLETE METABOLIC PANEL WITH GFR (Completed)   Scalp psoriasis    Betamethasone cream  prescribed as first treatment advised to keep dermatology appt in April       Transient neurological symptoms    She had 3 days of right hand and right foot weakness concerning for a lacunar CVA  But defers workup with MRI due to cost.  advised patient start taking a baby aspirin daily and reconsider having workup      Thyroid enlargement    Noted on exam today.   Ultrasound ordered  Lab Results  Component Value Date   TSH 3.48 05/16/2021         Relevant Orders   US THYROID   Other Visit Diagnoses     Screening for cervical cancer    -  Primary   Other fatigue       Relevant Orders   CBC with  Differential/Platelet (Completed)   TSH (Completed)       I am having Amelita A. Gripp maintain her fluticasone, ALPRAZolam, meloxicam, triamterene-hydrochlorothiazide, omeprazole, loratadine, and metoprolol succinate.  No orders of the defined types were placed in this encounter.    I provided  30 minutes of  face-to-face time during this encounter reviewing patient's current problems and past surgeries, labs and imaging studies, providing counseling on the above mentioned problems , and coordination  of care .   Follow-up: No follow-ups on file.   Crecencio Mc, MD

## 2021-05-17 NOTE — Assessment & Plan Note (Signed)
She had 3 days of right hand and right foot weakness concerning for a lacunar CVA  But defers workup with MRI due to cost.  advised patient start taking a baby aspirin daily and reconsider having workup

## 2021-05-17 NOTE — Assessment & Plan Note (Signed)
Noted on exam today.   Ultrasound ordered  Lab Results  Component Value Date   TSH 3.48 05/16/2021

## 2021-05-17 NOTE — Assessment & Plan Note (Addendum)
Betamethasone cream  prescribed as first treatment advised to keep dermatology appt in April

## 2021-05-17 NOTE — Assessment & Plan Note (Signed)
Well controlled on current regimen. Renal function stable, no changes today.  Lab Results  Component Value Date   CREATININE 0.96 05/16/2021   Lab Results  Component Value Date   NA 142 05/16/2021   K 3.9 05/16/2021   CL 103 05/16/2021   CO2 29 05/16/2021

## 2021-05-27 ENCOUNTER — Other Ambulatory Visit: Payer: Self-pay | Admitting: Internal Medicine

## 2021-05-27 DIAGNOSIS — R0982 Postnasal drip: Secondary | ICD-10-CM

## 2021-05-27 DIAGNOSIS — J309 Allergic rhinitis, unspecified: Secondary | ICD-10-CM

## 2021-05-27 DIAGNOSIS — H9203 Otalgia, bilateral: Secondary | ICD-10-CM

## 2021-05-28 ENCOUNTER — Ambulatory Visit: Payer: 59

## 2021-05-29 ENCOUNTER — Encounter: Payer: Self-pay | Admitting: Internal Medicine

## 2021-05-29 DIAGNOSIS — K2950 Unspecified chronic gastritis without bleeding: Secondary | ICD-10-CM | POA: Insufficient documentation

## 2021-05-29 DIAGNOSIS — Z0000 Encounter for general adult medical examination without abnormal findings: Secondary | ICD-10-CM

## 2021-05-29 DIAGNOSIS — K2930 Chronic superficial gastritis without bleeding: Secondary | ICD-10-CM

## 2021-07-05 ENCOUNTER — Other Ambulatory Visit: Payer: Self-pay | Admitting: Internal Medicine

## 2021-07-24 ENCOUNTER — Telehealth: Payer: Self-pay

## 2021-07-24 NOTE — Telephone Encounter (Signed)
PA for Triamterene-HCTZ has been submitted on covermymeds.  ?

## 2021-08-04 NOTE — Telephone Encounter (Signed)
Received a fax that stated that a PA in not required for this medication.  ?

## 2021-08-18 ENCOUNTER — Ambulatory Visit: Payer: 59 | Admitting: Dermatology

## 2021-08-18 DIAGNOSIS — L409 Psoriasis, unspecified: Secondary | ICD-10-CM

## 2021-08-18 MED ORDER — CLOBETASOL PROPIONATE 0.05 % EX SHAM: 1.0000 "application " | MEDICATED_SHAMPOO | CUTANEOUS | 4 refills | Status: DC

## 2021-08-18 MED ORDER — CALCIPOTRIENE 0.005 % EX SOLN: 1.0000 "application " | CUTANEOUS | 4 refills | Status: DC

## 2021-08-18 NOTE — Patient Instructions (Addendum)
Psoriasis is a chronic non-curable, but treatable genetic/hereditary disease that may have other systemic features affecting other organ systems such as joints (Psoriatic Arthritis). It is associated with an increased risk of inflammatory bowel disease, heart disease, non-alcoholic fatty liver disease, and depression.    ° ° ° ° ° ° °If You Need Anything After Your Visit ° °If you have any questions or concerns for your doctor, please call our main line at [phone] and press option 4 to reach your doctor's medical assistant. If no one answers, please leave a voicemail as directed and we will return your call as soon as possible. Messages left after 4 pm will be answered the following business day.  ° °You may also send us a message via MyChart. We typically respond to MyChart messages within 1-2 business days. ° °For prescription refills, please ask your pharmacy to contact our office. Our fax number is [phone]. ° °If you have an urgent issue when the clinic is closed that cannot wait until the next business day, you can page your doctor at the number below.   ° °Please note that while we do our best to be available for urgent issues outside of office hours, we are not available 24/7.  ° °If you have an urgent issue and are unable to reach us, you may choose to seek medical care at your doctor's office, retail clinic, urgent care center, or emergency room. ° °If you have a medical emergency, please immediately call 911 or go to the emergency department. ° °Pager Numbers ° °- Dr. Kowalski: [phone] ° °- Dr. Moye: [phone] ° °- Dr. Stewart: [phone] ° °In the event of inclement weather, please call our main line at [phone] for an update on the status of any delays or closures. ° °Dermatology Medication Tips: °Please keep the boxes that topical medications come in in order to help keep track of the instructions about where and how to use these. Pharmacies typically print the medication  instructions only on the boxes and not directly on the medication tubes.  ° °If your medication is too expensive, please contact our office at [phone] option 4 or send us a message through MyChart.  ° °We are unable to tell what your co-pay for medications will be in advance as this is different depending on your insurance coverage. However, we may be able to find a substitute medication at lower cost or fill out paperwork to get insurance to cover a needed medication.  ° °If a prior authorization is required to get your medication covered by your insurance company, please allow us 1-2 business days to complete this process. ° °Drug prices often vary depending on where the prescription is filled and some pharmacies may offer cheaper prices. ° °The website www.goodrx.com contains coupons for medications through different pharmacies. The prices here do not account for what the cost may be with help from insurance (it may be cheaper with your insurance), but the website can give you the price if you did not use any insurance.  °- You can print the associated coupon and take it with your prescription to the pharmacy.  °- You may also stop by our office during regular business hours and pick up a GoodRx coupon card.  °- If you need your prescription sent electronically to a different pharmacy, notify our office through [facility] MyChart or by phone at [phone] option 4. ° ° ° ° °Si Usted Necesita Algo Después de Su Visita ° °También puede enviarnos   un mensaje a través de MyChart. Por lo general respondemos a los mensajes de MyChart en el transcurso de 1 a 2 días hábiles. ° °Para renovar recetas, por favor pida a su farmacia que se ponga en contacto con nuestra oficina. Nuestro número de fax es el [phone]. ° °Si tiene un asunto urgente cuando la clínica esté cerrada y que no puede esperar hasta el siguiente día hábil, puede llamar/localizar a su doctor(a) al número que aparece a continuación.  ° °Por  favor, tenga en cuenta que aunque hacemos todo lo posible para estar disponibles para asuntos urgentes fuera del horario de oficina, no estamos disponibles las 24 horas del día, los 7 días de la semana.  ° °Si tiene un problema urgente y no puede comunicarse con nosotros, puede optar por buscar atención médica  en el consultorio de su doctor(a), en una clínica privada, en un centro de atención urgente o en una sala de emergencias. ° °Si tiene una emergencia médica, por favor llame inmediatamente al 911 o vaya a la sala de emergencias. ° °Números de bíper ° °- Dr. Kowalski: [phone] ° °- Dra. Moye: [phone] ° °- Dra. Stewart: [phone] ° °En caso de inclemencias del tiempo, por favor llame a nuestra línea principal al [phone] para una actualización sobre el estado de cualquier retraso o cierre. ° °Consejos para la medicación en dermatología: °Por favor, guarde las cajas en las que vienen los medicamentos de uso tópico para ayudarle a seguir las instrucciones sobre dónde y cómo usarlos. Las farmacias generalmente imprimen las instrucciones del medicamento sólo en las cajas y no directamente en los tubos del medicamento.  ° °Si su medicamento es muy caro, por favor, póngase en contacto con nuestra oficina llamando al [phone] y presione la opción 4 o envíenos un mensaje a través de MyChart.  ° °No podemos decirle cuál será su copago por los medicamentos por adelantado ya que esto es diferente dependiendo de la cobertura de su seguro. Sin embargo, es posible que podamos encontrar un medicamento sustituto a menor costo o llenar un formulario para que el seguro cubra el medicamento que se considera necesario.  ° °Si se requiere una autorización previa para que su compañía de seguros cubra su medicamento, por favor permítanos de 1 a 2 días hábiles para completar este proceso. ° °Los precios de los medicamentos varían con frecuencia dependiendo del lugar de dónde se surte la receta y alguna farmacias  pueden ofrecer precios más baratos. ° °El sitio web www.goodrx.com tiene cupones para medicamentos de diferentes farmacias. Los precios aquí no tienen en cuenta lo que podría costar con la ayuda del seguro (puede ser más barato con su seguro), pero el sitio web puede darle el precio si no utilizó ningún seguro.  °- Puede imprimir el cupón correspondiente y llevarlo con su receta a la farmacia.  °- También puede pasar por nuestra oficina durante el horario de atención regular y recoger una tarjeta de cupones de GoodRx.  °- Si necesita que su receta se envíe electrónicamente a una farmacia diferente, informe a nuestra oficina a través de MyChart de [facility] o por teléfono llamando al [phone] y presione la opción 4. ° °

## 2021-08-18 NOTE — Progress Notes (Signed)
? ?  New Patient Visit ? ?Subjective  ?Vanessa Richards is a 61 y.o. female who presents for the following: New Patient (Initial Visit) (Patient reports eczema / psoriasis in scalp for 2 years. Patient reports that itchy scaly areas have spread. Patient given betamethasone dipropionate cream. Patient has tried multiple over the counter shampoos and reports nothing has helped. ). ? ?The following portions of the chart were reviewed this encounter and updated as appropriate:  ? Tobacco  Allergies  Meds  Problems  Med Hx  Surg Hx  Fam Hx   ?  ?Review of Systems:  No other skin or systemic complaints except as noted in HPI or Assessment and Plan. ? ?Objective  ?Well appearing patient in no apparent distress; mood and affect are within normal limits. ? ?A focused examination was performed including scalp, b/l ears, face. Relevant physical exam findings are noted in the Assessment and Plan. ? ?Scalp, b/l posterior ears, b/l inside ears ?Scaly flaking areas behind ears, at scalp, and b/l inside ears.  ? ? ?Assessment & Plan  ?Psoriasis ?Scalp, b/l posterior ears, b/l inside ears ?Psoriasis is a chronic non-curable, but treatable genetic/hereditary disease that may have other systemic features affecting other organ systems such as joints (Psoriatic Arthritis). It is associated with an increased risk of inflammatory bowel disease, heart disease, non-alcoholic fatty liver disease, and depression.   ?Chronic and persistent condition with duration or expected duration over one year. Condition is bothersome/symptomatic for patient. Currently flared. ? ?Reports family history in father ?Discussed it is safe to color hair.  ? ?Start Clobetasol Propionate 0.05 % shampoo - apply topically to scalp and effected areas, let sit 15 - 30 mins, then rinse.  ?Start Calcipotriene 0.005% solution - apply topically to affected areas nightly the day before shampooing hair.  ? ?Clobetasol Propionate 0.05 % shampoo - Scalp, b/l posterior  ears, b/l inside ears ?Apply 1 application. topically 4 (four) times a week. Apply topically to scalp, let sit 15 - 30 minutes, then rinse. ? ?Calcipotriene 0.005 % solution - Scalp, b/l posterior ears, b/l inside ears ?Apply 1 application. topically See admin instructions. Use nightly the day before you shampoo hair. Leave ? ?Return for 2 - 3 month follow up. ? ?I, Asher Muir, CMA, am acting as scribe for Armida Sans, MD. ?Documentation: I have reviewed the above documentation for accuracy and completeness, and I agree with the above. ? ?Armida Sans, MD ? ?

## 2021-08-19 ENCOUNTER — Other Ambulatory Visit: Payer: Self-pay

## 2021-08-19 MED ORDER — BETAMETHASONE DIPROPIONATE 0.05 % EX CREA: TOPICAL_CREAM | Freq: Two times a day (BID) | CUTANEOUS | 0 refills | Status: DC

## 2021-08-19 NOTE — Telephone Encounter (Signed)
Refilled: 05/17/2021 ?Last OV: 05/16/2021 ?Next OV: 11/06/2021 ?

## 2021-08-26 ENCOUNTER — Encounter: Payer: Self-pay | Admitting: Dermatology

## 2021-09-03 ENCOUNTER — Other Ambulatory Visit: Payer: Self-pay | Admitting: Family

## 2021-10-06 ENCOUNTER — Other Ambulatory Visit: Payer: Self-pay | Admitting: Internal Medicine

## 2021-10-06 MED ORDER — METOPROLOL SUCCINATE ER 100 MG PO TB24: ORAL_TABLET | ORAL | 1 refills | Status: DC

## 2021-10-06 MED ORDER — MELOXICAM 15 MG PO TABS: 15.0000 mg | ORAL_TABLET | Freq: Every day | ORAL | 0 refills | Status: DC

## 2021-10-06 MED ORDER — ALPRAZOLAM 0.5 MG PO TABS: ORAL_TABLET | ORAL | 1 refills | Status: DC

## 2021-10-06 NOTE — Telephone Encounter (Signed)
Pt called in requesting refill on medications (metoprolol succinate (TOPROL-XL) 100 MG 24 hr tablet), (meloxicam (MOBIC) 15 MG tablet), and (ALPRAZolam (XANAX) 0.5 MG tablet)... Pt requesting callback.Marland KitchenMarland Kitchen

## 2021-10-06 NOTE — Telephone Encounter (Signed)
Refilled: 05/27/2021 Last OV: 05/16/2021 Next OV: 11/06/2021

## 2021-10-07 ENCOUNTER — Telehealth: Payer: Self-pay

## 2021-10-07 NOTE — Telephone Encounter (Signed)
PA for omeprazole has been submitted on covermymeds and approved.

## 2021-10-22 NOTE — Telephone Encounter (Signed)
PA has been approved through 10/07/2022.

## 2021-11-03 ENCOUNTER — Other Ambulatory Visit: Payer: Self-pay | Admitting: Internal Medicine

## 2021-11-03 ENCOUNTER — Encounter: Payer: 59 | Admitting: Internal Medicine

## 2021-11-06 ENCOUNTER — Encounter: Payer: Self-pay | Admitting: Internal Medicine

## 2021-11-06 ENCOUNTER — Other Ambulatory Visit (HOSPITAL_COMMUNITY)
Admission: RE | Admit: 2021-11-06 | Discharge: 2021-11-06 | Disposition: A | Discharge status: Home / Self Care | Payer: 59 | Source: Ambulatory Visit | Attending: Internal Medicine | Admitting: Internal Medicine

## 2021-11-06 ENCOUNTER — Ambulatory Visit (INDEPENDENT_AMBULATORY_CARE_PROVIDER_SITE_OTHER): Payer: 59 | Admitting: Internal Medicine

## 2021-11-06 VITALS — BP 120/90 | HR 79 | Temp 98.4°F | Ht 63.5 in | Wt 176.2 lb

## 2021-11-06 DIAGNOSIS — E7800 Pure hypercholesterolemia, unspecified: Secondary | ICD-10-CM | POA: Diagnosis not present

## 2021-11-06 DIAGNOSIS — E781 Pure hyperglyceridemia: Secondary | ICD-10-CM

## 2021-11-06 DIAGNOSIS — Z0001 Encounter for general adult medical examination with abnormal findings: Secondary | ICD-10-CM | POA: Diagnosis not present

## 2021-11-06 DIAGNOSIS — Z114 Encounter for screening for human immunodeficiency virus [HIV]: Secondary | ICD-10-CM

## 2021-11-06 DIAGNOSIS — Z124 Encounter for screening for malignant neoplasm of cervix: Secondary | ICD-10-CM

## 2021-11-06 DIAGNOSIS — E049 Nontoxic goiter, unspecified: Secondary | ICD-10-CM | POA: Diagnosis not present

## 2021-11-06 DIAGNOSIS — I10 Essential (primary) hypertension: Secondary | ICD-10-CM

## 2021-11-06 DIAGNOSIS — Z23 Encounter for immunization: Secondary | ICD-10-CM

## 2021-11-06 DIAGNOSIS — R7303 Prediabetes: Secondary | ICD-10-CM | POA: Diagnosis not present

## 2021-11-06 DIAGNOSIS — L409 Psoriasis, unspecified: Secondary | ICD-10-CM | POA: Diagnosis not present

## 2021-11-06 DIAGNOSIS — L259 Unspecified contact dermatitis, unspecified cause: Secondary | ICD-10-CM | POA: Diagnosis not present

## 2021-11-06 DIAGNOSIS — R69 Illness, unspecified: Secondary | ICD-10-CM | POA: Diagnosis not present

## 2021-11-06 DIAGNOSIS — Z1231 Encounter for screening mammogram for malignant neoplasm of breast: Secondary | ICD-10-CM

## 2021-11-06 DIAGNOSIS — Z1211 Encounter for screening for malignant neoplasm of colon: Secondary | ICD-10-CM | POA: Diagnosis not present

## 2021-11-06 DIAGNOSIS — E669 Obesity, unspecified: Secondary | ICD-10-CM

## 2021-11-06 LAB — COMPREHENSIVE METABOLIC PANEL
ALT: 18 U/L (ref 0–35)
AST: 21 U/L (ref 0–37)
Albumin: 4.5 g/dL (ref 3.5–5.2)
Alkaline Phosphatase: 80 U/L (ref 39–117)
BUN: 13 mg/dL (ref 6–23)
CO2: 28 mEq/L (ref 19–32)
Calcium: 9.7 mg/dL (ref 8.4–10.5)
Chloride: 102 mEq/L (ref 96–112)
Creatinine, Ser: 0.83 mg/dL (ref 0.40–1.20)
GFR: 76.47 mL/min (ref >60.00)
Glucose, Bld: 94 mg/dL (ref 70–99)
Potassium: 3.6 mEq/L (ref 3.5–5.1)
Sodium: 140 mEq/L (ref 135–145)
Total Bilirubin: 0.4 mg/dL (ref 0.2–1.2)
Total Protein: 7.4 g/dL (ref 6.0–8.3)

## 2021-11-06 LAB — CBC
HCT: 42.5 % (ref 36.0–46.0)
Hemoglobin: 14.1 g/dL (ref 12.0–15.0)
MCHC: 33.1 g/dL (ref 30.0–36.0)
MCV: 89.6 fl (ref 78.0–100.0)
Platelets: 309 10*3/uL (ref 150.0–400.0)
RBC: 4.74 Mil/uL (ref 3.87–5.11)
RDW: 13.6 % (ref 11.5–15.5)
WBC: 7.6 10*3/uL (ref 4.0–10.5)

## 2021-11-06 LAB — LIPID PANEL
Cholesterol: 221 mg/dL — ABNORMAL HIGH (ref 0–200)
HDL: 51.9 mg/dL (ref >39.00)
Total CHOL/HDL Ratio: 4
Triglycerides: 512 mg/dL — ABNORMAL HIGH (ref 0.0–149.0)

## 2021-11-06 LAB — HEMOGLOBIN A1C: Hgb A1c MFr Bld: 5.7 % (ref 4.6–6.5)

## 2021-11-06 LAB — TSH: TSH: 3.11 u[IU]/mL (ref 0.35–5.50)

## 2021-11-06 LAB — LDL CHOLESTEROL, DIRECT: Direct LDL: 99 mg/dL

## 2021-11-06 MED ORDER — BETAMETHASONE DIPROPIONATE 0.05 % EX CREA: TOPICAL_CREAM | Freq: Two times a day (BID) | CUTANEOUS | 2 refills | Status: DC

## 2021-11-06 NOTE — Patient Instructions (Addendum)
1) Check BP at home 5 days and send me readings . Your reading was high today (120/90,  goal is 120/70) we may need to add something to your metoprolol and maxzide regimen  2) need to exercise  5 days a week . 30 minutes.  Stretch afterward  3) You are DEFINITELY irritated in your private area.  Use the betamethasone cream twice daily for one week to resolve the irritation   4) next colon ca screening is due in 2026  5) return by the early January for your 2nd Shingrix vaccine ( 6 months deadline)

## 2021-11-06 NOTE — Assessment & Plan Note (Signed)
I have addressed  BMI and recommended a low glycemic index diet utilizing smaller more frequent meals to increase metabolism.  I have also recommended that patient start exercising with a goal of 30 minutes of aerobic exercise a minimum of 5 days per week. Screening for lipid disorders, thyroid and diabetes to be done today.   

## 2021-11-06 NOTE — Progress Notes (Signed)
The patient is here for annual preventive examination and management of other chronic and acute problems.   The risk factors are reflected in the social history.   The roster of all physicians providing medical care to patient - is listed in the Snapshot section of the chart.   Activities of daily living:  The patient is 100% independent in all ADLs: dressing, toileting, feeding as well as independent mobility   Home safety : The patient has smoke detectors in the home. They wear seatbelts.  There are no unsecured firearms at home. There is no violence in the home.    There is no risks for hepatitis, STDs or HIV. There is no   history of blood transfusion. They have no travel history to infectious disease endemic areas of the world.   The patient has seen their dentist in the last six month. They have seen their eye doctor in the last year. The patinet  denies slight hearing difficulty with regard to whispered voices and some television programs.  They have deferred audiologic testing in the last year.  They do not  have excessive sun exposure. Discussed the need for sun protection: hats, long sleeves and use of sunscreen if there is significant sun exposure.    Diet: the importance of a healthy diet is discussed. They do have a healthy diet.   The benefits of regular aerobic exercise were discussed. The patient  exercises  3 to 5 days per week  for  60 minutes.    Depression screen: there are no signs or vegative symptoms of depression- irritability, change in appetite, anhedonia, sadness/tearfullness.   The following portions of the patient's history were reviewed and updated as appropriate: allergies, current medications, past family history, past medical history,  past surgical history, past social history  and problem list.   Visual acuity was not assessed per patient preference since the patient has regular follow up with an  ophthalmologist. Hearing and body mass index were assessed and  reviewed.    During the course of the visit the patient was educated and counseled about appropriate screening and preventive services including : fall prevention , diabetes screening, nutrition counseling, colorectal cancer screening, and recommended immunizations.    Chief Complaint:  1) recurrent episodes of diarrhea lasting several days, occurring nocturnally as well as during the day,  without fevers or cramping ,  with spontaneous resolution.  Wonders if food related  2)  right anterior thigh becomes tight feeling and sore ,  occurring every night.  Has low back pain chronically     3) diagnosed with psoriasis of scalp ,  middle ear ,  and behind ear.  Using betamethasone cream until [place] prescribed a scalp lotion   4) vaginitis ;  attributed to cheap toilet paper /skin reaction.  No discharge.  Improved but not resolved with vagisil   Review of Symptoms  Patient denies headache, fevers, malaise, unintentional weight loss, skin rash, eye pain, sinus congestion and sinus pain, sore throat, dysphagia,  hemoptysis , cough, dyspnea, wheezing, chest pain, palpitations, orthopnea, edema, abdominal pain, nausea, melena, diarrhea, constipation, flank pain, dysuria, hematuria, urinary  Frequency, nocturia, numbness, tingling, seizures,  Focal weakness, Loss of consciousness,  Tremor, insomnia, depression, anxiety, and suicidal ideation.    Physical Exam:  BP 120/90 (BP Location: Right Arm, Patient Position: Sitting, Cuff Size: Normal)   Pulse 79   Temp 98.4 F (36.9 C) (Oral)   Ht 5' 3.5" (1.613 m)   Wt 176  lb 3.2 oz (79.9 kg)   SpO2 99%   BMI 30.72 kg/m     General Appearance:    Alert, cooperative, no distress, appears stated age  Head:    Normocephalic, without obvious abnormality, atraumatic  Eyes:    PERRL, conjunctiva/corneas clear, EOM's intact, fundi    benign, both eyes  Ears:    Normal TM's and external ear canals, both ears  Nose:   Nares normal, septum midline, mucosa  normal, no drainage    or sinus tenderness  Throat:   Lips, mucosa, and tongue normal; teeth and gums normal  Neck:   Supple, symmetrical, trachea midline, no adenopathy;    thyroid:  no enlargement/tenderness/nodules; no carotid   bruit or JVD  Back:     Symmetric, no curvature, ROM normal, no CVA tenderness  Lungs:     Clear to auscultation bilaterally, respirations unlabored  Chest Wall:    No tenderness or deformity   Heart:    Regular rate and rhythm, S1 and S2 normal, no murmur, rub   or gallop  Breast Exam:    No tenderness, masses, or nipple abnormality  Abdomen:     Soft, non-tender, bowel sounds active all four quadrants,    no masses, no organomegaly  Genitalia:    Pelvic: cervix normal in appearance, external genitalia normal except for a macular erythematous rash consistent with contact dermatitis/irritation.  no adnexal masses or tenderness, no cervical motion tenderness, rectovaginal septum normal, uterus normal size, shape, and consistency and vagina normal without discharge  Extremities:   Extremities normal, atraumatic, no cyanosis or edema  Pulses:   2+ and symmetric all extremities  Skin:   Skin color, texture, turgor normal, no rashes or lesions  Lymph nodes:   Cervical, supraclavicular, and axillary nodes normal  Neurologic:   CNII-XII intact, normal strength, sensation and reflexes    throughout    Assessment and Plan:  Encounter for general adult medical examination with abnormal findings Contact dermatitis involving skin of labia and perineum.  age appropriate education and counseling updated, referrals for preventative services and immunizations addressed, dietary and smoking counseling addressed, most recent labs reviewed.  I have personally reviewed and have noted:   1) the patient's medical and social history 2) The pt's use of alcohol, tobacco, and illicit drugs 3) The patient's current medications and supplements 4) Functional ability including ADL's,  fall risk, home safety risk, hearing and visual impairment 5) Diet and physical activities 6) Evidence for depression or mood disorder 7) The patient's height, weight, and BMI have been recorded in the chart    I have made referral for mammogram , and provided counseling and education based on review of the above  Obesity (BMI 30-39.9) I have addressed  BMI and recommended a low glycemic index diet utilizing smaller more frequent meals to increase metabolism.  I have also recommended that patient start exercising with a goal of 30 minutes of aerobic exercise a minimum of 5 days per week. Screening for lipid disorders, thyroid and diabetes to be done today.    Contact dermatitis of female genitalia Secondary to a toilet paper brand purchased at [facility].  Steroid cream prescribed for twice daily use until resolved.   Scalp psoriasis Managed now with betamethasone lotion prescribed by dermatology   Updated Medication List Outpatient Encounter Medications as of 11/06/2021  Medication Sig   ALPRAZolam (XANAX) 0.5 MG tablet TAKE ONE TABLET BY MOUTH AT BEDTIME AS NEEDED FOR ANXIETY   Calcipotriene 0.005 %  solution Apply 1 application. topically See admin instructions. Use nightly the day before you shampoo hair. Leave   Clobetasol Propionate 0.05 % shampoo Apply 1 application. topically 4 (four) times a week. Apply topically to scalp, let sit 15 - 30 minutes, then rinse.   fluticasone (FLONASE) 50 MCG/ACT nasal spray Place 2 sprays into both nostrils daily.   loratadine (CLARITIN) 10 MG tablet TAKE ONE TABLET BY MOUTH ONCE DAILY   meloxicam (MOBIC) 15 MG tablet TAKE 1 TABLET(15 MG) BY MOUTH DAILY   metoprolol succinate (TOPROL-XL) 100 MG 24 hr tablet TAKE ONE TABLET BY MOUTH ONCE DAILY. TAKE WITH OR IMMEDIATELY FOLLOWING A MEAL   omeprazole (PRILOSEC) 20 MG capsule TAKE 1 CAPSULE BY MOUTH EVERY DAY   triamterene-hydrochlorothiazide (DYAZIDE) 37.5-25 MG capsule TAKE 1 CAPSULE BY MOUTH EVERY  MORNING   [DISCONTINUED] betamethasone dipropionate 0.05 % cream Apply topically 2 (two) times daily.   betamethasone dipropionate 0.05 % cream Apply topically 2 (two) times daily. Until rash has resolved   No facility-administered encounter medications on file as of 11/06/2021.

## 2021-11-06 NOTE — Assessment & Plan Note (Signed)
Contact dermatitis involving skin of labia and perineum.  age appropriate education and counseling updated, referrals for preventative services and immunizations addressed, dietary and smoking counseling addressed, most recent labs reviewed.  I have personally reviewed and have noted:   1) the patient's medical and social history 2) The pt's use of alcohol, tobacco, and illicit drugs 3) The patient's current medications and supplements 4) Functional ability including ADL's, fall risk, home safety risk, hearing and visual impairment 5) Diet and physical activities 6) Evidence for depression or mood disorder 7) The patient's height, weight, and BMI have been recorded in the chart    I have made referral for mammogram , and provided counseling and education based on review of the above

## 2021-11-07 LAB — HIV ANTIBODY (ROUTINE TESTING W REFLEX): HIV 1&2 Ab, 4th Generation: NONREACTIVE

## 2021-11-09 DIAGNOSIS — L259 Unspecified contact dermatitis, unspecified cause: Secondary | ICD-10-CM | POA: Insufficient documentation

## 2021-11-09 NOTE — Assessment & Plan Note (Signed)
Secondary to a toilet paper brand purchased at [employer].  Steroid cream prescribed for twice daily use until resolved.

## 2021-11-09 NOTE — Assessment & Plan Note (Signed)
Managed now with betamethasone lotion prescribed by dermatology

## 2021-11-10 LAB — CYTOLOGY - PAP
Comment: NEGATIVE
Diagnosis: UNDETERMINED — AB
High risk HPV: NEGATIVE

## 2021-11-10 MED ORDER — FENOFIBRATE 48 MG PO TABS: 48.0000 mg | ORAL_TABLET | Freq: Every day | ORAL | 0 refills | Status: DC

## 2021-11-10 NOTE — Addendum Note (Signed)
Addended by: Sherlene Shams on: 11/10/2021 07:11 PM   Modules accepted: Orders

## 2021-11-12 ENCOUNTER — Encounter: Payer: Self-pay | Admitting: Internal Medicine

## 2021-11-12 ENCOUNTER — Telehealth: Payer: Self-pay

## 2021-11-12 ENCOUNTER — Other Ambulatory Visit: Payer: Self-pay | Admitting: Internal Medicine

## 2021-11-12 DIAGNOSIS — E781 Pure hyperglyceridemia: Secondary | ICD-10-CM

## 2021-11-12 NOTE — Telephone Encounter (Signed)
Patient returned Vanessa Richards, CMA's call.  I read the messages from [facility] and Dr. Duncan Dull.  Patient states she would like to speak with [place] regarding her triglycerides.  Patient states they were high and she wasn't sure whether or not she should be on medication.

## 2021-11-12 NOTE — Assessment & Plan Note (Signed)
Starting fenofibrate.  Return for cmet in 3 weeks

## 2021-11-12 NOTE — Telephone Encounter (Signed)
-----   Message from Sherlene Shams, MD sent at 11/12/2021 10:57 AM EDT ----- Your PAP smear showed ASCUS (atypical squamous cells of undetermined significance) but  your HPV screen was negative.    We will follow The guidelines on management of atypical results with normal HPV which  recommend repeating your PAP smear in 3 years.   Regards,   Duncan Dull, MD

## 2021-11-12 NOTE — Telephone Encounter (Signed)
Pt stated that she was fasting at the time of her lab draw.

## 2021-11-12 NOTE — Telephone Encounter (Signed)
LMTCB in regards to lab results.  

## 2021-11-13 NOTE — Telephone Encounter (Signed)
LMTCB

## 2021-11-17 ENCOUNTER — Other Ambulatory Visit: Payer: Self-pay

## 2021-11-17 MED ORDER — OMEPRAZOLE 20 MG PO CPDR: DELAYED_RELEASE_CAPSULE | ORAL | 3 refills | Status: DC

## 2021-11-17 NOTE — Telephone Encounter (Signed)
Spoke with pt and informed her of medication that Dr. Darrick Huntsman would like for her to start. Pt gave a verbal understanding and scheduled lab appts.

## 2021-12-03 ENCOUNTER — Other Ambulatory Visit: Payer: Self-pay | Admitting: Internal Medicine

## 2021-12-09 ENCOUNTER — Other Ambulatory Visit (INDEPENDENT_AMBULATORY_CARE_PROVIDER_SITE_OTHER): Payer: 59

## 2021-12-09 DIAGNOSIS — E781 Pure hyperglyceridemia: Secondary | ICD-10-CM | POA: Diagnosis not present

## 2021-12-09 LAB — COMPREHENSIVE METABOLIC PANEL
ALT: 18 U/L (ref 0–35)
AST: 22 U/L (ref 0–37)
Albumin: 4.5 g/dL (ref 3.5–5.2)
Alkaline Phosphatase: 81 U/L (ref 39–117)
BUN: 13 mg/dL (ref 6–23)
CO2: 31 mEq/L (ref 19–32)
Calcium: 9.9 mg/dL (ref 8.4–10.5)
Chloride: 100 mEq/L (ref 96–112)
Creatinine, Ser: 0.89 mg/dL (ref 0.40–1.20)
GFR: 70.28 mL/min (ref >60.00)
Glucose, Bld: 110 mg/dL — ABNORMAL HIGH (ref 70–99)
Potassium: 3.3 mEq/L — ABNORMAL LOW (ref 3.5–5.1)
Sodium: 141 mEq/L (ref 135–145)
Total Bilirubin: 0.4 mg/dL (ref 0.2–1.2)
Total Protein: 7.9 g/dL (ref 6.0–8.3)

## 2021-12-11 ENCOUNTER — Telehealth: Payer: Self-pay

## 2021-12-11 NOTE — Telephone Encounter (Signed)
LMTCB for lab results.  

## 2021-12-12 ENCOUNTER — Other Ambulatory Visit: Payer: Self-pay

## 2021-12-12 DIAGNOSIS — E876 Hypokalemia: Secondary | ICD-10-CM

## 2021-12-16 ENCOUNTER — Other Ambulatory Visit (INDEPENDENT_AMBULATORY_CARE_PROVIDER_SITE_OTHER): Payer: 59

## 2021-12-16 DIAGNOSIS — E876 Hypokalemia: Secondary | ICD-10-CM | POA: Diagnosis not present

## 2021-12-16 DIAGNOSIS — E781 Pure hyperglyceridemia: Secondary | ICD-10-CM

## 2021-12-16 LAB — POTASSIUM: Potassium: 4 mEq/L (ref 3.5–5.1)

## 2021-12-18 NOTE — Assessment & Plan Note (Signed)
She did not tolerate a trial of tricor .  Will repeat lipids after a 3 month trial of diet and exercise.

## 2021-12-18 NOTE — Addendum Note (Signed)
Addended by: Sherlene Shams on: 12/18/2021 05:29 PM   Modules accepted: Orders

## 2022-01-02 ENCOUNTER — Other Ambulatory Visit: Payer: Self-pay | Admitting: Family

## 2022-01-07 ENCOUNTER — Ambulatory Visit (INDEPENDENT_AMBULATORY_CARE_PROVIDER_SITE_OTHER): Payer: 59 | Admitting: *Deleted

## 2022-01-07 DIAGNOSIS — Z23 Encounter for immunization: Secondary | ICD-10-CM

## 2022-01-07 NOTE — Progress Notes (Signed)
Pt received Shingrix vaccine (second dose) in right deltoid. Pt tolerated it well it had no reactions to first injection.

## 2022-01-13 ENCOUNTER — Ambulatory Visit
Admission: RE | Admit: 2022-01-13 | Discharge: 2022-01-13 | Disposition: A | Discharge status: Home / Self Care | Payer: 59 | Source: Ambulatory Visit | Attending: Internal Medicine | Admitting: Internal Medicine

## 2022-01-13 DIAGNOSIS — Z1231 Encounter for screening mammogram for malignant neoplasm of breast: Secondary | ICD-10-CM | POA: Insufficient documentation

## 2022-01-27 ENCOUNTER — Other Ambulatory Visit: Payer: Self-pay

## 2022-01-27 MED ORDER — MELOXICAM 15 MG PO TABS: 15.0000 mg | ORAL_TABLET | Freq: Every day | ORAL | 0 refills | Status: DC

## 2022-02-09 ENCOUNTER — Other Ambulatory Visit: Payer: 59

## 2022-02-11 ENCOUNTER — Other Ambulatory Visit: Payer: Self-pay | Admitting: Internal Medicine

## 2022-02-12 NOTE — Telephone Encounter (Signed)
Medication was discontinued on 12/18/2021. Is it okay to refuse refill?

## 2022-02-17 ENCOUNTER — Other Ambulatory Visit (INDEPENDENT_AMBULATORY_CARE_PROVIDER_SITE_OTHER): Payer: 59

## 2022-02-17 DIAGNOSIS — E876 Hypokalemia: Secondary | ICD-10-CM

## 2022-02-17 DIAGNOSIS — E781 Pure hyperglyceridemia: Secondary | ICD-10-CM

## 2022-02-17 LAB — BASIC METABOLIC PANEL
BUN: 17 mg/dL (ref 6–23)
CO2: 30 mEq/L (ref 19–32)
Calcium: 9.5 mg/dL (ref 8.4–10.5)
Chloride: 101 mEq/L (ref 96–112)
Creatinine, Ser: 0.76 mg/dL (ref 0.40–1.20)
GFR: 84.83 mL/min (ref >60.00)
Glucose, Bld: 101 mg/dL — ABNORMAL HIGH (ref 70–99)
Potassium: 4.1 mEq/L (ref 3.5–5.1)
Sodium: 138 mEq/L (ref 135–145)

## 2022-02-18 LAB — LIPID PANEL W/REFLEX DIRECT LDL
Cholesterol: 213 mg/dL — ABNORMAL HIGH (ref <200)
HDL: 45 mg/dL — ABNORMAL LOW (ref >=50)
LDL Cholesterol (Calc): 119 mg/dL (calc) — ABNORMAL HIGH
Non-HDL Cholesterol (Calc): 168 mg/dL (calc) — ABNORMAL HIGH (ref <130)
Total CHOL/HDL Ratio: 4.7 (calc) (ref <5.0)
Triglycerides: 344 mg/dL — ABNORMAL HIGH (ref <150)

## 2022-03-03 ENCOUNTER — Other Ambulatory Visit: Payer: Self-pay | Admitting: Internal Medicine

## 2022-03-25 ENCOUNTER — Ambulatory Visit: Payer: 59 | Admitting: Dermatology

## 2022-03-25 ENCOUNTER — Other Ambulatory Visit: Payer: Self-pay

## 2022-03-25 DIAGNOSIS — L409 Psoriasis, unspecified: Secondary | ICD-10-CM

## 2022-03-25 DIAGNOSIS — Z79899 Other long term (current) drug therapy: Secondary | ICD-10-CM | POA: Diagnosis not present

## 2022-03-25 MED ORDER — CLOBETASOL PROPIONATE 0.05 % EX SOLN: CUTANEOUS | 4 refills | Status: DC

## 2022-03-25 MED ORDER — CALCIPOTRIENE 0.005 % EX SOLN: 1.0000 "application " | CUTANEOUS | 4 refills | Status: DC

## 2022-03-25 NOTE — Progress Notes (Signed)
Pharmacy did not have RX on file. aw

## 2022-03-25 NOTE — Patient Instructions (Addendum)
For itchy scalp  Can use neutrogena T gel or T sal shampoo  Can start clobetasol solution twice daily to affected areas of ears and behind ears for 2 weeks then continue using on weekends only  Can also use twice daily to itch scalp as needed.   Vtama cream - apply twice daily to itchy areas  Zoryve cream - can apply twice daily to itchy areas        Due to recent changes in healthcare laws, you may see results of your pathology and/or laboratory studies on MyChart before the doctors have had a chance to review them. We understand that in some cases there may be results that are confusing or concerning to you. Please understand that not all results are received at the same time and often the doctors may need to interpret multiple results in order to provide you with the best plan of care or course of treatment. Therefore, we ask that you please give [place] 2 business days to thoroughly review all your results before contacting the office for clarification. Should we see a critical lab result, you will be contacted sooner.   If You Need Anything After Your Visit  If you have any questions or concerns for your doctor, please call our main line at [phone] and press option 4 to reach your doctor's medical assistant. If no one answers, please leave a voicemail as directed and we will return your call as soon as possible. Messages left after 4 pm will be answered the following business day.   You may also send [place] a message via [place]. We typically respond to MyChart messages within 1-2 business days.  For prescription refills, please ask your pharmacy to contact our office. Our fax number is [phone].  If you have an urgent issue when the clinic is closed that cannot wait until the next business day, you can page your doctor at the number below.    Please note that while we do our best to be available for urgent issues outside of office hours, we are not available 24/7.   If you have  an urgent issue and are unable to reach [place], you may choose to seek medical care at your doctor's office, retail clinic, urgent care center, or emergency room.  If you have a medical emergency, please immediately call 911 or go to the emergency department.  Pager Numbers  - Dr. Nehemiah Massed: [phone]  - Dr. Laurence Ferrari: [phone]  - Dr. Nicole Kindred: [phone]  In the event of inclement weather, please call our main line at [phone] for an update on the status of any delays or closures.  Dermatology Medication Tips: Please keep the boxes that topical medications come in in order to help keep track of the instructions about where and how to use these. Pharmacies typically print the medication instructions only on the boxes and not directly on the medication tubes.   If your medication is too expensive, please contact our office at [phone] option 4 or send [place] a message through [place].   We are unable to tell what your co-pay for medications will be in advance as this is different depending on your insurance coverage. However, we may be able to find a substitute medication at lower cost or fill out paperwork to get insurance to cover a needed medication.   If a prior authorization is required to get your medication covered by your insurance company, please allow [place] 1-2 business days to complete this process.  Drug prices  often vary depending on where the prescription is filled and some pharmacies may offer cheaper prices.  The website www.goodrx.com contains coupons for medications through different pharmacies. The prices here do not account for what the cost may be with help from insurance (it may be cheaper with your insurance), but the website can give you the price if you did not use any insurance.  - You can print the associated coupon and take it with your prescription to the pharmacy.  - You may also stop by our office during regular business hours and pick up a GoodRx coupon card.   - If you need your prescription sent electronically to a different pharmacy, notify our office through [facility] or by phone at [phone] option 4.     Si Usted Necesita Algo Despus de Su Visita  Tambin puede enviarnos un mensaje a travs de Clinical cytogeneticist. Por lo general respondemos a los mensajes de MyChart en el transcurso de 1 a 2 das hbiles.  Para renovar recetas, por favor pida a su farmacia que se ponga en contacto con nuestra oficina. Annie Sable de fax es [place] [phone].  Si tiene un asunto urgente cuando la clnica est cerrada y que no puede esperar hasta el siguiente da hbil, puede llamar/localizar a su doctor(a) al nmero que aparece a continuacin.   Por favor, tenga en cuenta que aunque hacemos todo lo posible para estar disponibles para asuntos urgentes fuera del horario de [place], no estamos disponibles las 24 horas del da, los 7 [address]  [address] de la [place].   Si tiene un problema urgente y no puede comunicarse con nosotros, puede optar por buscar atencin mdica  en el consultorio de su doctor(a), en una clnica privada, en un centro de atencin urgente o en una sala de emergencias.  Si tiene Engineer, drilling, por favor llame inmediatamente al 911 o vaya a la sala de emergencias.  Nmeros de bper  - Dr. Gwen Pounds: [phone]  - Dra. Moye: [phone]  - Dra. Roseanne Reno: [phone]  En caso de inclemencias del [place], por favor llame a Lacy Duverney principal al [phone] para una actualizacin sobre el [place] de cualquier retraso o cierre.  Consejos para la medicacin en dermatologa: Por favor, guarde las cajas en las que vienen los medicamentos de uso tpico para ayudarle a seguir las instrucciones sobre dnde y cmo usarlos. Las farmacias generalmente imprimen las instrucciones del medicamento slo en las cajas y no directamente en los tubos del [place].   Si su medicamento es muy caro, por favor, pngase en contacto con Rolm Gala  llamando al [phone] y presione la opcin 4 o envenos un mensaje a travs de Clinical cytogeneticist.   No podemos decirle cul ser su copago por los medicamentos por adelantado ya que esto es diferente dependiendo de la cobertura de su seguro. Sin embargo, es posible que podamos encontrar un medicamento sustituto a Audiological scientist un formulario para que el seguro cubra el medicamento que se considera necesario.   Si se requiere una autorizacin previa para que su compaa de seguros [place] su medicamento, por favor permtanos de 1 a 2 das hbiles para completar [address]  Los precios de los medicamentos varan con frecuencia dependiendo del Environmental consultant de dnde se surte la receta y alguna farmacias pueden ofrecer precios ms baratos.  El sitio web www.goodrx.com tiene cupones para medicamentos de Health and safety inspector. Los precios aqu no tienen en cuenta lo que podra costar con la ayuda del seguro (puede ser ms barato con  su seguro), pero el sitio web puede darle el precio si no Field seismologist.  - Puede imprimir el cupn correspondiente y llevarlo con su receta a la farmacia.  - Tambin puede pasar por nuestra oficina durante el horario de atencin regular y Charity fundraiser una tarjeta de cupones de GoodRx.  - Si necesita que su receta se enve electrnicamente a una farmacia diferente, informe a nuestra oficina a travs de MyChart de [place] o por telfono llamando al [phone] y presione la opcin 4.

## 2022-03-25 NOTE — Progress Notes (Signed)
   Follow-Up Visit   Subjective  Vanessa Richards is a 61 y.o. female who presents for the following: Psoriasis (Reports is very itchy at scalp and ears. Was prescribed Clobetasol Propionate 0.05 % shampoo and Calcipotriene 0.005 % solution to use at affected areas. Patient reports still very flared and itchy after using. ).  The following portions of the chart were reviewed this encounter and updated as appropriate:  Tobacco  Allergies  Meds  Problems  Med Hx  Surg Hx  Fam Hx      Review of Systems: No other skin or systemic complaints except as noted in HPI or Assessment and Plan.   Objective  Well appearing patient in no apparent distress; mood and affect are within normal limits.  A focused examination was performed including scalp, ears, hands, elbows, face. Relevant physical exam findings are noted in the Assessment and Plan.  Scalp, b/l posterior ears, b/l inside ears Thick scaly pink plaque occipital scalp and postauricular and conchebals, 1 nail pit right 3rd finger   Assessment & Plan  Psoriasis Scalp, b/l posterior ears, b/l inside ears  Chronic and persistent condition with duration or expected duration over one year. Condition is bothersome/symptomatic for patient. Currently flared.  Psoriasis is a chronic non-curable, but treatable genetic/hereditary disease that may have other systemic features affecting other organ systems such as joints (Psoriatic Arthritis). It is associated with an increased risk of inflammatory bowel disease, heart disease, non-alcoholic fatty liver disease, and depression.    Reports family history in father Reports history of pain in joints mid day, denies joint pain in morning  Continue Calcipotriene solution twice a day  Stop Clobetasol Shampoo  Can use Neutrogena T Gel or T Sal shampoo.   Start Clobetasol solution apply bid to areas at scalp when flared. Can use twice daily to affected areas behind ears for 2 weeks, then use on  weekends only. Avoid applying to face, groin, and axilla. Use as directed. Long-term use can cause thinning of the skin.  Start vtama samples - apply daily to affected itchy areas daily.   If still flared, will consider [place], Sotyktu or a biologic  Side effects of Otezla (apremilast) include diarrhea, nausea, headache, upper respiratory infection, depression, and weight decrease (5-10%). It should only be taken by pregnant women after a discussion regarding risks and benefits with their doctor. Goal is control of skin condition, not cure.  The use of [place] requires long term medication management, including periodic office visits.  Topical steroids (such as triamcinolone, fluocinolone, fluocinonide, mometasone, clobetasol, halobetasol, betamethasone, hydrocortisone) can cause thinning and lightening of the skin if they are used for too long in the same area. Your physician has selected the right strength medicine for your problem and area affected on the body. Please use your medication only as directed by your physician to prevent side effects.    clobetasol (TEMOVATE) 0.05 % external solution - Scalp, b/l posterior ears, b/l inside ears Apply topically bid for 2 weeks for itchy rash at ears and behind ears. After 2 weeks use only bid on weekends only.  Can use twice daily for itchy areas of scalp prn. Avoid applying to face, groin, and axilla   Return for psoriasis. I, Asher Muir, CMA, am acting as scribe for Darden Dates, MD.  Documentation: I have reviewed the above documentation for accuracy and completeness, and I agree with the above.  Darden Dates, MD

## 2022-03-30 ENCOUNTER — Other Ambulatory Visit: Payer: Self-pay

## 2022-03-30 DIAGNOSIS — L409 Psoriasis, unspecified: Secondary | ICD-10-CM

## 2022-03-30 MED ORDER — CLOBETASOL PROPIONATE 0.05 % EX SOLN: CUTANEOUS | 4 refills | Status: DC

## 2022-03-30 NOTE — Progress Notes (Signed)
E-scripting prescription as the RX last week was printed. aw

## 2022-03-31 ENCOUNTER — Encounter: Payer: Self-pay | Admitting: Dermatology

## 2022-04-03 ENCOUNTER — Other Ambulatory Visit: Payer: Self-pay | Admitting: Internal Medicine

## 2022-04-21 ENCOUNTER — Telehealth: Payer: Self-pay

## 2022-04-21 NOTE — Telephone Encounter (Signed)
Pt called requesting samples of Vtama cream, discussed with pt we will leave samples of Vtama cream in the front office for her to come by and pick up

## 2022-05-12 ENCOUNTER — Ambulatory Visit: Payer: 59 | Admitting: Dermatology

## 2022-05-12 ENCOUNTER — Encounter: Payer: Self-pay | Admitting: Dermatology

## 2022-05-12 VITALS — BP 139/86 | HR 79

## 2022-05-12 DIAGNOSIS — L409 Psoriasis, unspecified: Secondary | ICD-10-CM

## 2022-05-12 NOTE — Patient Instructions (Addendum)
Continue Clobetasol solution on weekends. Increase to twice daily for 2 weeks as needed for flares.   Continue Neutrogena T/Sal shampoo as directed.   Continue Calcipotriene solution twice daily.   Zoryve samples given to be applied once daily to affected areas.  Call with Rx request for Vtama or Zoryve, whichever works best.   Topical steroids (such as triamcinolone, fluocinolone, fluocinonide, mometasone, clobetasol, halobetasol, betamethasone, hydrocortisone) can cause thinning and lightening of the skin if they are used for too long in the same area. Your physician has selected the right strength medicine for your problem and area affected on the body. Please use your medication only as directed by your physician to prevent side effects.    Due to recent changes in healthcare laws, you may see results of your pathology and/or laboratory studies on MyChart before the doctors have had a chance to review them. We understand that in some cases there may be results that are confusing or concerning to you. Please understand that not all results are received at the same time and often the doctors may need to interpret multiple results in order to provide you with the best plan of care or course of treatment. Therefore, we ask that you please give [place] 2 business days to thoroughly review all your results before contacting the office for clarification. Should we see a critical lab result, you will be contacted sooner.   If You Need Anything After Your Visit  If you have any questions or concerns for your doctor, please call our main line at [phone] and press option 4 to reach your doctor's medical assistant. If no one answers, please leave a voicemail as directed and we will return your call as soon as possible. Messages left after 4 pm will be answered the following business day.   You may also send [place] a message via [place]. We typically respond to MyChart messages within 1-2 business  days.  For prescription refills, please ask your pharmacy to contact our office. Our fax number is [phone].  If you have an urgent issue when the clinic is closed that cannot wait until the next business day, you can page your doctor at the number below.    Please note that while we do our best to be available for urgent issues outside of office hours, we are not available 24/7.   If you have an urgent issue and are unable to reach [place], you may choose to seek medical care at your doctor's office, retail clinic, urgent care center, or emergency room.  If you have a medical emergency, please immediately call 911 or go to the emergency department.  Pager Numbers  - Dr. Nehemiah Massed: [phone]  - Dr. Laurence Ferrari: [phone]  - Dr. Nicole Kindred: [phone]  In the event of inclement weather, please call our main line at [phone] for an update on the status of any delays or closures.  Dermatology Medication Tips: Please keep the boxes that topical medications come in in order to help keep track of the instructions about where and how to use these. Pharmacies typically print the medication instructions only on the boxes and not directly on the medication tubes.   If your medication is too expensive, please contact our office at [phone] option 4 or send [place] a message through [place].   We are unable to tell what your co-pay for medications will be in advance as this is different depending on your insurance coverage. However, we may be able to find a substitute  medication at lower cost or fill out paperwork to get insurance to cover a needed medication.   If a prior authorization is required to get your medication covered by your insurance company, please allow [place] 1-2 business days to complete this process.  Drug prices often vary depending on where the prescription is filled and some pharmacies may offer cheaper prices.  The website www.goodrx.com contains coupons for medications through  different pharmacies. The prices here do not account for what the cost may be with help from insurance (it may be cheaper with your insurance), but the website can give you the price if you did not use any insurance.  - You can print the associated coupon and take it with your prescription to the pharmacy.  - You may also stop by our office during regular business hours and pick up a GoodRx coupon card.  - If you need your prescription sent electronically to a different pharmacy, notify our office through [facility] or by phone at [phone] option 4.     Si Usted Necesita Algo Despus de Su Visita  Tambin puede enviarnos un mensaje a travs de Pharmacist, community. Por lo general respondemos a los mensajes de MyChart en el transcurso de 1 a 2 das hbiles.  Para renovar recetas, por favor pida a su farmacia que se ponga en contacto con nuestra oficina. Harland Dingwall de fax es [place] [phone].  Si tiene un asunto urgente cuando la clnica est cerrada y que no puede esperar hasta el siguiente da hbil, puede llamar/localizar a su doctor(a) al nmero que aparece a continuacin.   Por favor, tenga en cuenta que aunque hacemos todo lo posible para estar disponibles para asuntos urgentes fuera del horario de [place], no estamos disponibles las 24 horas del da, los 7 das de la [place].   Si tiene un problema urgente y no puede comunicarse con nosotros, puede optar por buscar atencin mdica  en el consultorio de su doctor(a), en una clnica privada, en un centro de atencin urgente o en una sala de emergencias.  Si tiene Engineering geologist, por favor llame inmediatamente al 911 o vaya a la sala de emergencias.  Nmeros de bper  - Dr. Nehemiah Massed: [phone]  - Dra. Moye: [phone]  - Dra. Nicole Kindred: [phone]  En caso de inclemencias del [place], por favor llame a Johnsie Kindred principal al [phone] para una actualizacin sobre el [place] de cualquier retraso o cierre.  Consejos  para la medicacin en dermatologa: Por favor, guarde las cajas en las que vienen los medicamentos de uso tpico para ayudarle a seguir las instrucciones sobre dnde y cmo usarlos. Las farmacias generalmente imprimen las instrucciones del medicamento slo en las cajas y no directamente en los tubos del [place].   Si su medicamento es muy caro, por favor, pngase en contacto con Zigmund Daniel llamando al [phone] y presione la opcin 4 o envenos un mensaje a travs de Pharmacist, community.   No podemos decirle cul ser su copago por los medicamentos por adelantado ya que esto es diferente dependiendo de la cobertura de su seguro. Sin embargo, es posible que podamos encontrar un medicamento sustituto a Electrical engineer un formulario para que el seguro cubra el medicamento que se considera necesario.   Si se requiere una autorizacin previa para que su compaa de seguros [place] su medicamento, por favor permtanos de 1 a 2 das hbiles para completar este proceso.  Los precios de los medicamentos varan con frecuencia dependiendo del Environmental consultant de  lugar de dnde se surte la receta y alguna farmacias pueden ofrecer precios ms baratos.  El sitio web www.goodrx.com tiene cupones para medicamentos de diferentes farmacias. Los precios aqu no tienen en cuenta lo que podra costar con la ayuda del seguro (puede ser ms barato con su seguro), pero el sitio web puede darle el precio si no utiliz ningn seguro.  - Puede imprimir el cupn correspondiente y llevarlo con su receta a la farmacia.  - Tambin puede pasar por nuestra oficina durante el horario de atencin regular y recoger una tarjeta de cupones de GoodRx.  - Si necesita que su receta se enve electrnicamente a una farmacia diferente, informe a nuestra oficina a travs de MyChart de [place] o por telfono llamando al [phone] y presione la opcin 4.  

## 2022-05-12 NOTE — Progress Notes (Signed)
   Follow-Up Visit   Subjective  Vanessa Richards is a 62 y.o. female who presents for the following: Psoriasis (6 week follow up. Scalp, ears. Using Calcipotriene solution twice daily, Clobetasol solution as directed, is on weekends only now. Uses Vtama as needed. Using T/Sal shampoo.  Patient reports psoriasis has improved. Does have arthritis in feet and knees. C/O stiffness in hips, morning).  The following portions of the chart were reviewed this encounter and updated as appropriate:  Tobacco  Allergies  Meds  Problems  Med Hx  Surg Hx  Fam Hx      Review of Systems: No other skin or systemic complaints except as noted in HPI or Assessment and Plan.   Objective  Well appearing patient in no apparent distress; mood and affect are within normal limits.  A focused examination was performed including head, including the scalp, face, neck, nose, ears, eyelids, and lips. Relevant physical exam findings are noted in the Assessment and Plan.  Scalp, B/L ears Scalp with thin scaly pink plaques. Nails clear.    Assessment & Plan  Psoriasis Scalp, B/L ears  Chronic and persistent condition with duration or expected duration over one year. Condition is symptomatic/ bothersome to patient. Not currently at goal.  Counseling on psoriasis and coordination of care  psoriasis is a chronic non-curable, but treatable genetic/hereditary disease that may have other systemic features affecting other organ systems such as joints (Psoriatic Arthritis). It is associated with an increased risk of inflammatory bowel disease, heart disease, non-alcoholic fatty liver disease, and depression.  Treatments include light and laser treatments; topical medications; and systemic medications including oral and injectables.  Continue Clobetasol solution on weekends. Increase to twice daily for 2 weeks as needed for flares.   Continue Neutrogena T/Sal shampoo as directed.   Continue Calcipotriene solution  twice daily.   Start Zoryve samples given to be applied once daily to affected areas.  Call with Rx request for Vtama or Zoryve, whichever works best.  Joint pain not suggestive of psoriatic arthritis  Topical steroids (such as triamcinolone, fluocinolone, fluocinonide, mometasone, clobetasol, halobetasol, betamethasone, hydrocortisone) can cause thinning and lightening of the skin if they are used for too long in the same area. Your physician has selected the right strength medicine for your problem and area affected on the body. Please use your medication only as directed by your physician to prevent side effects.    Related Medications Calcipotriene 0.005 % solution Apply 1 application  topically See admin instructions. Use nightly the day before you shampoo hair. Leave  clobetasol (TEMOVATE) 0.05 % external solution Apply topically bid for 2 weeks for itchy rash at ears and behind ears. After 2 weeks use only bid on weekends only. Can use twice daily for itchy areas of scalp prn. Avoid applying to face, groin, and axilla   Return for Psoriasis Follow Up 3-6 months.  I, Emelia Salisbury, CMA, am acting as scribe for Forest Gleason, MD.  Documentation: I have reviewed the above documentation for accuracy and completeness, and I agree with the above.  Forest Gleason, MD

## 2022-05-19 ENCOUNTER — Encounter: Payer: Self-pay | Admitting: Dermatology

## 2022-05-22 ENCOUNTER — Encounter: Payer: Self-pay | Admitting: Nurse Practitioner

## 2022-05-22 ENCOUNTER — Ambulatory Visit: Payer: 59 | Admitting: Nurse Practitioner

## 2022-05-22 VITALS — BP 138/80 | HR 91 | Temp 97.9°F | Resp 15 | Ht 63.5 in | Wt 174.0 lb

## 2022-05-22 DIAGNOSIS — J019 Acute sinusitis, unspecified: Secondary | ICD-10-CM | POA: Insufficient documentation

## 2022-05-22 DIAGNOSIS — G43909 Migraine, unspecified, not intractable, without status migrainosus: Secondary | ICD-10-CM | POA: Diagnosis not present

## 2022-05-22 DIAGNOSIS — J018 Other acute sinusitis: Secondary | ICD-10-CM

## 2022-05-22 MED ORDER — SUMATRIPTAN SUCCINATE 25 MG PO TABS: ORAL_TABLET | ORAL | 0 refills | Status: DC

## 2022-05-22 MED ORDER — AMOXICILLIN 500 MG PO TABS: 500.0000 mg | ORAL_TABLET | Freq: Two times a day (BID) | ORAL | 0 refills | Status: DC

## 2022-05-22 MED ORDER — PREDNISONE 20 MG PO TABS: 40.0000 mg | ORAL_TABLET | Freq: Every day | ORAL | 0 refills | Status: DC

## 2022-05-22 NOTE — Assessment & Plan Note (Signed)
Refilled Imitrex 25 mg for migraine headaches

## 2022-05-22 NOTE — Progress Notes (Signed)
Established Patient Office Visit  Subjective:  Patient ID: Vanessa Richards, female    DOB: Oct 04, 1960  Age: 62 y.o. MRN: [id number]  CC:  Chief Complaint  Patient presents with   Acute Visit    SINUS INFECTION     HPI  [name] presents for:  Sinusitis This is a new problem. The current episode started 1 to 4 weeks ago. The problem has been gradually worsening since onset. There has been no fever. Associated symptoms include congestion, ear pain, headaches and sinus pressure. Pertinent negatives include no shortness of breath, sore throat or swollen glands. The treatment provided no relief.    Patient requested a refill for Imitrex 25 mg for migraine headaches.  Past Medical History:  Diagnosis Date   GERD (gastroesophageal reflux disease)    Hyperlipidemia    Hypertension    Migraine syndrome     Past Surgical History:  Procedure Laterality Date   COLONOSCOPY WITH PROPOFOL N/A 03/15/2015   Procedure: COLONOSCOPY WITH PROPOFOL;  Surgeon: Lollie Sails, MD;  Location: [facility] ENDOSCOPY;  Service: Endoscopy;  Laterality: N/A;   DILATION AND CURETTAGE OF UTERUS     ESOPHAGOGASTRODUODENOSCOPY (EGD) WITH PROPOFOL N/A 03/15/2015   Procedure: ESOPHAGOGASTRODUODENOSCOPY (EGD) WITH PROPOFOL;  Surgeon: Lollie Sails, MD;  Location: [facility] ENDOSCOPY;  Service: Endoscopy;  Laterality: N/A;   TUBAL LIGATION      Family History  Problem Relation Age of Onset   Heart disease Father 64       AMI, smoker. angina in his 56's    Hypertension Father    Heart disease Paternal Grandfather 33       AMI   Arthritis Sister 3       rheumaotid arthritis    Early death Paternal Uncle    Breast cancer Neg Hx     Social History   Socioeconomic History   Marital status: Married    Spouse name: Not on file   Number of children: Not on file   Years of education: Not on file   Highest education level: Not on file  Occupational History   Not on file  Tobacco Use    Smoking status: Never   Smokeless tobacco: Never  Substance and Sexual Activity   Alcohol use: Yes    Alcohol/week: 2.0 standard drinks of alcohol    Types: 2 Glasses of wine per week   Drug use: No   Sexual activity: Not on file  Other Topics Concern   Not on file  Social History Narrative   Not on file   Social Determinants of Health   Financial Resource Strain: Not on file  Food Insecurity: Not on file  Transportation Needs: Not on file  Physical Activity: Not on file  Stress: Not on file  Social Connections: Not on file  Intimate Partner Violence: Not on file     Outpatient Medications Prior to Visit  Medication Sig Dispense Refill   ALPRAZolam (XANAX) 0.5 MG tablet TAKE ONE TABLET BY MOUTH AT BEDTIME AS NEEDED FOR ANXIETY 30 tablet 1   betamethasone dipropionate 0.05 % cream Apply topically 2 (two) times daily. Until rash has resolved 45 g 2   Calcipotriene 0.005 % solution Apply 1 application  topically See admin instructions. Use nightly the day before you shampoo hair. Leave 60 mL 4   clobetasol (TEMOVATE) 0.05 % external solution Apply topically bid for 2 weeks for itchy rash at ears and behind ears. After 2 weeks use only  bid on weekends only. Can use twice daily for itchy areas of scalp prn. Avoid applying to face, groin, and axilla 50 mL 4   fluticasone (FLONASE) 50 MCG/ACT nasal spray Place 2 sprays into both nostrils daily. 16 g 5   loratadine (CLARITIN) 10 MG tablet TAKE ONE TABLET BY MOUTH ONCE DAILY 30 tablet 1   meloxicam (MOBIC) 15 MG tablet Take 1 tablet (15 mg total) by mouth every other day. If needed for pain 15 tablet 2   metoprolol succinate (TOPROL-XL) 100 MG 24 hr tablet TAKE ONE TABLET BY MOUTH ONCE DAILY. TAKE WITH OR IMMEDIATELY FOLLOWING A MEAL 90 tablet 1   omeprazole (PRILOSEC) 20 MG capsule TAKE 1 CAPSULE BY MOUTH EVERY DAY 90 capsule 3   triamterene-hydrochlorothiazide (DYAZIDE) 37.5-25 MG capsule TAKE 1 CAPSULE BY MOUTH EVERY MORNING 30 capsule  1   No facility-administered medications prior to visit.    Allergies  Allergen Reactions   Tricor [Fenofibrate] Other (See Comments)    Intolerance     ROS Review of Systems  Constitutional: Negative.   HENT:  Positive for congestion, ear pain, sinus pressure and sinus pain. Negative for sore throat.        Right side tooth pain  Eyes: Negative.   Respiratory:  Negative for shortness of breath.   Cardiovascular:  Negative for chest pain and leg swelling.  Musculoskeletal: Negative.   Skin: Negative.   Neurological:  Positive for headaches.      Objective:    Physical Exam Constitutional:      Appearance: Normal appearance.  HENT:     Right Ear: Tympanic membrane normal.     Left Ear: Tympanic membrane normal.     Nose: Congestion present.     Right Sinus: Maxillary sinus tenderness and frontal sinus tenderness present.     Left Sinus: Maxillary sinus tenderness and frontal sinus tenderness present.     Comments: Right more compared to left    Mouth/Throat:     Mouth: Mucous membranes are moist.  Eyes:     Conjunctiva/sclera: Conjunctivae normal.     Pupils: Pupils are equal, round, and reactive to light.  Cardiovascular:     Rate and Rhythm: Normal rate and regular rhythm.     Pulses: Normal pulses.     Heart sounds: Normal heart sounds.  Pulmonary:     Effort: Pulmonary effort is normal.     Breath sounds: Normal breath sounds.  Musculoskeletal:        General: No swelling or signs of injury.  Neurological:     General: No focal deficit present.     Mental Status: She is alert and oriented to person, place, and time. Mental status is at baseline.  Psychiatric:        Mood and Affect: Mood normal.        Behavior: Behavior normal.        Thought Content: Thought content normal.        Judgment: Judgment normal.     BP 138/80   Pulse 91   Temp 97.9 F (36.6 C) (Temporal)   Resp 15   Ht 5' 3.5" (1.613 m)   Wt 174 lb (78.9 kg)   SpO2 98%   BMI  30.34 kg/m  Wt Readings from Last 3 Encounters:  05/22/22 174 lb (78.9 kg)  11/06/21 176 lb 3.2 oz (79.9 kg)  05/16/21 176 lb 9.6 oz (80.1 kg)     Health Maintenance  Topic Date Due  INFLUENZA VACCINE  08/02/2022 (Originally 12/02/2021)   MAMMOGRAM  01/14/2023   DTaP/Tdap/Td (3 - Td or Tdap) 11/02/2023   PAP SMEAR-Modifier  11/06/2024   COLONOSCOPY (Pts 45-16yrs Insurance coverage will need to be confirmed)  03/29/2025   Hepatitis C Screening  Completed   HIV Screening  Completed   Zoster Vaccines- Shingrix  Completed   HPV VACCINES  Aged Out   COVID-19 Vaccine  Discontinued    There are no preventive care reminders to display for this patient.  Lab Results  Component Value Date   TSH 3.11 11/06/2021   Lab Results  Component Value Date   WBC 7.6 11/06/2021   HGB 14.1 11/06/2021   HCT 42.5 11/06/2021   MCV 89.6 11/06/2021   PLT 309.0 11/06/2021   Lab Results  Component Value Date   NA 138 02/17/2022   K 4.1 02/17/2022   CO2 30 02/17/2022   GLUCOSE 101 (H) 02/17/2022   BUN 17 02/17/2022   CREATININE 0.76 02/17/2022   BILITOT 0.4 12/09/2021   ALKPHOS 81 12/09/2021   AST 22 12/09/2021   ALT 18 12/09/2021   PROT 7.9 12/09/2021   ALBUMIN 4.5 12/09/2021   CALCIUM 9.5 02/17/2022   EGFR 68 05/16/2021   GFR 84.83 02/17/2022   Lab Results  Component Value Date   CHOL 213 (H) 02/17/2022   Lab Results  Component Value Date   HDL 45 (L) 02/17/2022   Lab Results  Component Value Date   LDLCALC 119 (H) 02/17/2022   Lab Results  Component Value Date   TRIG 344 (H) 02/17/2022   Lab Results  Component Value Date   CHOLHDL 4.7 02/17/2022   Lab Results  Component Value Date   HGBA1C 5.7 11/06/2021      Assessment & Plan:   Problem List Items Addressed This Visit       Cardiovascular and Mediastinum   Migraine syndrome    Refilled Imitrex 25 mg for migraine headaches      Relevant Medications   SUMAtriptan (IMITREX) 25 MG tablet     Respiratory    Acute infection of nasal sinus - Primary    Started her on amoxicillin 500 mg twice a day for 7 days prednisone 40 mg once a day for 5 days. Encourage patient to increase hydration.      Relevant Medications   predniSONE (DELTASONE) 20 MG tablet   amoxicillin (AMOXIL) 500 MG tablet     Meds ordered this encounter  Medications   SUMAtriptan (IMITREX) 25 MG tablet    Sig: Take 1 tablet at the onset of headache) every 2 hours as needed  not to exceed 8 in 24 hours    Dispense:  9 tablet    Refill:  0   predniSONE (DELTASONE) 20 MG tablet    Sig: Take 2 tablets (40 mg total) by mouth daily with breakfast.    Dispense:  10 tablet    Refill:  0   amoxicillin (AMOXIL) 500 MG tablet    Sig: Take 1 tablet (500 mg total) by mouth 2 (two) times daily.    Dispense:  14 tablet    Refill:  0     Follow-up: No follow-ups on file.    Theresia Lo, NP

## 2022-05-22 NOTE — Patient Instructions (Addendum)
Started her on amoxicillin 500 mg twice a day for 7 days. Prednisone 40 mg once a day for 5 days. Refilled Imitrex 25 mg for migraine headaches.

## 2022-05-22 NOTE — Assessment & Plan Note (Signed)
Started her on amoxicillin 500 mg twice a day for 7 days prednisone 40 mg once a day for 5 days. Encourage patient to increase hydration.

## 2022-05-25 ENCOUNTER — Telehealth: Payer: Self-pay

## 2022-05-25 MED ORDER — VTAMA 1 % EX CREA: TOPICAL_CREAM | CUTANEOUS | 2 refills | Status: DC

## 2022-05-25 NOTE — Telephone Encounter (Signed)
Patient called requesting a rx for Vtama cream, okay Vtama sent to [facility]

## 2022-06-01 ENCOUNTER — Other Ambulatory Visit: Payer: Self-pay

## 2022-06-01 NOTE — Telephone Encounter (Signed)
Refill request for the pended medication Meloxicam was received via fax from [facility]  LOV: 05/22/22 with [facility], NP  LOV with PCP: 11-06-21

## 2022-06-02 ENCOUNTER — Other Ambulatory Visit: Payer: Self-pay

## 2022-06-02 MED ORDER — MELOXICAM 15 MG PO TABS: 15.0000 mg | ORAL_TABLET | ORAL | 1 refills | Status: DC

## 2022-07-28 ENCOUNTER — Other Ambulatory Visit: Payer: Self-pay | Admitting: Internal Medicine

## 2022-08-11 ENCOUNTER — Other Ambulatory Visit: Payer: Self-pay | Admitting: Internal Medicine

## 2022-08-11 MED ORDER — ALPRAZOLAM 0.5 MG PO TABS: ORAL_TABLET | ORAL | 4 refills | Status: DC

## 2022-08-11 MED ORDER — TRIAMTERENE-HCTZ 37.5-25 MG PO CAPS: 1.0000 | ORAL_CAPSULE | Freq: Every morning | ORAL | 0 refills | Status: DC

## 2022-08-11 NOTE — Telephone Encounter (Signed)
Refilled: 10/06/2021 Last OV: 11/06/2021 Next OV: 08/26/2022

## 2022-08-11 NOTE — Telephone Encounter (Signed)
Prescription Request  08/11/2022  LOV: 11/06/2021  What is the name of the medication or equipment? ALPRAZolam (XANAX) 0.5 MG tablet  and triamterene-hydrochlorothiazide (DYAZIDE) 37.5-25 MG capsule   Have you contacted your pharmacy to request a refill? Yes    Which pharmacy would you like this sent to?   [id number] [place], [place] - [address] AT [facility] [address] [place] [address] Phone: [phone] Fax: [phone]     Patient notified that their request is being sent to the clinical staff for review and that they should receive a response within 2 business days.   Please advise at Mobile [phone] (mobile)

## 2022-08-26 ENCOUNTER — Ambulatory Visit: Payer: 59 | Admitting: Internal Medicine

## 2022-08-26 ENCOUNTER — Encounter: Payer: Self-pay | Admitting: Internal Medicine

## 2022-08-26 VITALS — BP 130/84 | HR 85 | Temp 98.6°F | Ht 63.5 in | Wt 173.4 lb

## 2022-08-26 DIAGNOSIS — F411 Generalized anxiety disorder: Secondary | ICD-10-CM

## 2022-08-26 DIAGNOSIS — I10 Essential (primary) hypertension: Secondary | ICD-10-CM | POA: Diagnosis not present

## 2022-08-26 DIAGNOSIS — R7303 Prediabetes: Secondary | ICD-10-CM

## 2022-08-26 DIAGNOSIS — E781 Pure hyperglyceridemia: Secondary | ICD-10-CM

## 2022-08-26 LAB — COMPREHENSIVE METABOLIC PANEL
ALT: 15 U/L (ref 0–35)
AST: 18 U/L (ref 0–37)
Albumin: 4.5 g/dL (ref 3.5–5.2)
Alkaline Phosphatase: 84 U/L (ref 39–117)
BUN: 11 mg/dL (ref 6–23)
CO2: 30 mEq/L (ref 19–32)
Calcium: 9.9 mg/dL (ref 8.4–10.5)
Chloride: 100 mEq/L (ref 96–112)
Creatinine, Ser: 0.79 mg/dL (ref 0.40–1.20)
GFR: 80.68 mL/min (ref >60.00)
Glucose, Bld: 85 mg/dL (ref 70–99)
Potassium: 4 mEq/L (ref 3.5–5.1)
Sodium: 138 mEq/L (ref 135–145)
Total Bilirubin: 0.4 mg/dL (ref 0.2–1.2)
Total Protein: 7.5 g/dL (ref 6.0–8.3)

## 2022-08-26 LAB — LIPID PANEL
Cholesterol: 192 mg/dL (ref 0–200)
HDL: 50.9 mg/dL (ref >39.00)
NonHDL: 141.39
Total CHOL/HDL Ratio: 4
Triglycerides: 261 mg/dL — ABNORMAL HIGH (ref 0.0–149.0)
VLDL: 52.2 mg/dL — ABNORMAL HIGH (ref 0.0–40.0)

## 2022-08-26 LAB — LDL CHOLESTEROL, DIRECT: Direct LDL: 110 mg/dL

## 2022-08-26 LAB — MICROALBUMIN / CREATININE URINE RATIO
Creatinine,U: 49.3 mg/dL
Microalb Creat Ratio: 1.7 mg/g (ref 0.0–30.0)
Microalb, Ur: 0.8 mg/dL (ref 0.0–1.9)

## 2022-08-26 LAB — HEMOGLOBIN A1C: Hgb A1c MFr Bld: 5.7 % (ref 4.6–6.5)

## 2022-08-26 MED ORDER — TRAZODONE HCL 50 MG PO TABS: 25.0000 mg | ORAL_TABLET | Freq: Every evening | ORAL | 3 refills | Status: DC | PRN

## 2022-08-26 MED ORDER — ALPRAZOLAM 0.5 MG PO TABS: ORAL_TABLET | ORAL | 4 refills | Status: DC

## 2022-08-26 MED ORDER — ESCITALOPRAM OXALATE 10 MG PO TABS: 10.0000 mg | ORAL_TABLET | Freq: Every day | ORAL | 1 refills | Status: DC

## 2022-08-26 MED ORDER — METOPROLOL SUCCINATE ER 100 MG PO TB24: ORAL_TABLET | ORAL | 1 refills | Status: DC

## 2022-08-26 NOTE — Assessment & Plan Note (Signed)
Elevated today due to anxiety.  S She has been asked to check her pressures at home and submit readings for evaluation. Renal function is normal today   Lab Results  Component Value Date   CREATININE 0.79 08/26/2022   Lab Results  Component Value Date   MICROALBUR 0.8 08/26/2022   MICROALBUR 0.6 05/16/2021     Lab Results  Component Value Date   NA 138 08/26/2022   K 4.0 08/26/2022   CL 100 08/26/2022   CO2 30 08/26/2022
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
## 2022-08-26 NOTE — Assessment & Plan Note (Signed)
She did not tolerate a trial of tricor . lipids have improved after a 3 month trial of diet and exercise.   Lab Results  Component Value Date   CHOL 192 08/26/2022   HDL 50.90 08/26/2022   LDLCALC 119 (H) 02/17/2022   LDLDIRECT 110.0 08/26/2022   TRIG 261.0 (H) 08/26/2022   CHOLHDL 4 08/26/2022

## 2022-08-26 NOTE — Patient Instructions (Addendum)
Increase lexapro to 10 mg  daily for your anxiety   If sleep does not improve, try adding trazodone  at bedtime.  It will help you stay asleep and is NOT addicting.  Save the alprazolam for really tough nights.   Check BP occasionally at home and send me the readings

## 2022-08-26 NOTE — Assessment & Plan Note (Signed)
A1c dropped while adhering the the [employer]avia diet and has remained at the ULN   Lab Results  Component Value Date   HGBA1C 5.7 08/26/2022

## 2022-09-02 ENCOUNTER — Ambulatory Visit: Payer: 59 | Admitting: Dermatology

## 2022-09-02 VITALS — BP 141/87 | HR 74

## 2022-09-02 DIAGNOSIS — L409 Psoriasis, unspecified: Secondary | ICD-10-CM | POA: Diagnosis not present

## 2022-09-02 DIAGNOSIS — H00015 Hordeolum externum left lower eyelid: Secondary | ICD-10-CM | POA: Diagnosis not present

## 2022-09-02 MED ORDER — DOXYCYCLINE HYCLATE 20 MG PO TABS: 20.0000 mg | ORAL_TABLET | Freq: Two times a day (BID) | ORAL | 0 refills | Status: AC

## 2022-09-02 NOTE — Patient Instructions (Addendum)
Recommend Ocusoft eyewash or Johnson's baby shampoo daily to wash the eyelid with. Recommend warm compress four to five times a day with a warm wash cloth. If eyelid issues persistent recommend follow up with ophthalmologist.   Take doxycycline 20 mg tablet by mouth twice a day. #60, 3 refills  Doxycycline should be taken with food to prevent nausea. Do not lay down for 30 minutes after taking. Be cautious with sun exposure and use good sun protection while on this medication. Pregnant women should not take this medication.    Due to recent changes in healthcare laws, you may see results of your pathology and/or laboratory studies on MyChart before the doctors have had a chance to review them. We understand that in some cases there may be results that are confusing or concerning to you. Please understand that not all results are received at the same time and often the doctors may need to interpret multiple results in order to provide you with the best plan of care or course of treatment. Therefore, we ask that you please give [place] 2 business days to thoroughly review all your results before contacting the office for clarification. Should we see a critical lab result, you will be contacted sooner.   If You Need Anything After Your Visit  If you have any questions or concerns for your doctor, please call our main line at [phone] and press option 4 to reach your doctor's medical assistant. If no one answers, please leave a voicemail as directed and we will return your call as soon as possible. Messages left after 4 pm will be answered the following business day.   You may also send [place] a message via MyChart. We typically respond to MyChart messages within 1-2 business days.  For prescription refills, please ask your pharmacy to contact our office. Our fax number is [phone].  If you have an urgent issue when the clinic is closed that cannot wait until the next business day, you can page your  doctor at the number below.    Please note that while we do our best to be available for urgent issues outside of office hours, we are not available 24/7.   If you have an urgent issue and are unable to reach [place], you may choose to seek medical care at your doctor's office, retail clinic, urgent care center, or emergency room.  If you have a medical emergency, please immediately call 911 or go to the emergency department.  Pager Numbers  - Dr. Gwen Pounds: [phone]  - Dr. Neale Burly: [phone]  - Dr. Roseanne Reno: [phone]  In the event of inclement weather, please call our main line at [phone] for an update on the status of any delays or closures.  Dermatology Medication Tips: Please keep the boxes that topical medications come in in order to help keep track of the instructions about where and how to use these. Pharmacies typically print the medication instructions only on the boxes and not directly on the medication tubes.   If your medication is too expensive, please contact our office at [phone] option 4 or send [place] a message through MyChart.   We are unable to tell what your co-pay for medications will be in advance as this is different depending on your insurance coverage. However, we may be able to find a substitute medication at lower cost or fill out paperwork to get insurance to cover a needed medication.   If a prior authorization is required to get your medication covered  by your insurance company, please allow [place] 1-2 business days to complete this process.  Drug prices often vary depending on where the prescription is filled and some pharmacies may offer cheaper prices.  The website www.goodrx.com contains coupons for medications through different pharmacies. The prices here do not account for what the cost may be with help from insurance (it may be cheaper with your insurance), but the website can give you the price if you did not use any insurance.  - You can print  the associated coupon and take it with your prescription to the pharmacy.  - You may also stop by our office during regular business hours and pick up a GoodRx coupon card.  - If you need your prescription sent electronically to a different pharmacy, notify our office through [facility] or by phone at [phone] option 4.     Si Usted Necesita Algo Despus de Su Visita  Tambin puede enviarnos un mensaje a travs de Clinical cytogeneticist. Por lo general respondemos a los mensajes de MyChart en el transcurso de 1 a 2 das hbiles.  Para renovar recetas, por favor pida a su farmacia que se ponga en contacto con nuestra oficina. Annie Sable de fax es [place] [phone].  Si tiene un asunto urgente cuando la clnica est cerrada y que no puede esperar hasta el siguiente da hbil, puede llamar/localizar a su doctor(a) al nmero que aparece a continuacin.   Por favor, tenga en cuenta que aunque hacemos todo lo posible para estar disponibles para asuntos urgentes fuera del horario de [place], no estamos disponibles las 24 horas del da, los 7 [address]  [address] de la [place].   Si tiene un problema urgente y no puede comunicarse con nosotros, puede optar por buscar atencin mdica  en el consultorio de su doctor(a), en una clnica privada, en un centro de atencin urgente o en una sala de emergencias.  Si tiene Engineer, drilling, por favor llame inmediatamente al 911 o vaya a la sala de emergencias.  Nmeros de bper  - Dr. Gwen Pounds: [phone]  - Dra. Moye: [phone]  - Dra. Roseanne Reno: [phone]  En caso de inclemencias del [place], por favor llame a Lacy Duverney principal al [phone] para una actualizacin sobre el [place] de cualquier retraso o cierre.  Consejos para la medicacin en dermatologa: Por favor, guarde las cajas en las que vienen los medicamentos de uso tpico para ayudarle a seguir las instrucciones sobre dnde y cmo usarlos. Las farmacias generalmente imprimen las  instrucciones del medicamento slo en las cajas y no directamente en los tubos del [place].   Si su medicamento es muy caro, por favor, pngase en contacto con Rolm [place]ala llamando al [phone] y presione la opcin 4 o envenos un mensaje a travs de Clinical cytogeneticist.   No podemos decirle cul ser su copago por los medicamentos por adelantado ya que esto es diferente dependiendo de la cobertura de su seguro. Sin embargo, es posible que podamos encontrar un medicamento sustituto a Audiological scientist un formulario para que el seguro cubra el medicamento que se considera necesario.   Si se requiere una autorizacin previa para que su compaa de seguros [place] su medicamento, por favor permtanos de 1 a 2 das hbiles para completar [address]  Los precios de los medicamentos varan con frecuencia dependiendo del Environmental consultant de dnde se surte la receta y alguna farmacias pueden ofrecer precios ms baratos.  El sitio web www.goodrx.com tiene cupones para medicamentos de Health and safety inspector. Los precios aqu no  en cuenta lo que podra costar con la ayuda del seguro (puede ser ms barato con su seguro), pero el sitio web puede darle el precio si no utiliz ningn seguro.  - Puede imprimir el cupn correspondiente y llevarlo con su receta a la farmacia.  - Tambin puede pasar por nuestra oficina durante el horario de atencin regular y recoger una tarjeta de cupones de GoodRx.  - Si necesita que su receta se enve electrnicamente a una farmacia diferente, informe a nuestra oficina a travs de MyChart de [place] o por telfono llamando al [phone] y presione la opcin 4.  

## 2022-09-02 NOTE — Progress Notes (Signed)
   Follow-Up Visit   Subjective  Vanessa Richards is a 62 y.o. female who presents for the following: Psoriasis, was doing well, but started flaring two weeks ago. Pt under a lot of stress due to issues with neighbors. A couple of days ago she started experiencing pain, burning, tenderness, itching of the post neck, face, and ear. She is concerned it may be shingles, but no rash is present. For psoriasis she is using Vtama cream and Clobetasol solution and an OTC Amish soap.  The following portions of the chart were reviewed this encounter and updated as appropriate: medications, allergies, medical history  Review of Systems:  No other skin or systemic complaints except as noted in HPI or Assessment and Plan.  Objective  Well appearing patient in no apparent distress; mood and affect are within normal limits.  Areas Examined: The face, scalp, and neck   Relevant exam findings are noted in the Assessment and Plan.   Assessment & Plan   PSORIASIS Exam: Well-demarcated erythematous papules/plaques with silvery scale at scalp, postauricular  Chronic and persistent condition with duration or expected duration over one year. Condition is bothersome/symptomatic for patient. Currently flared likely from sleep disruption and stress.   Patient denies joint pain  Counseling on psoriasis and coordination of care  psoriasis is a chronic non-curable, but treatable genetic/hereditary disease that may have other systemic features affecting other organ systems such as joints (Psoriatic Arthritis). It is associated with an increased risk of inflammatory bowel disease, heart disease, non-alcoholic fatty liver disease, and depression.  Treatments include light and laser treatments; topical medications; and systemic medications including oral and injectables.  Treatment Plan: Increase Clobetasol solution to aa's BID and Vtama cream to aa's BID. May decrease Vtama to QD once under control and Clobetasol  solution QD-BID PRN flares.    STYE OF LEFT LOWER EYELID  Treatment Plan:  Warm compress 4-5 times per day with a warm wash cloth. Wash eyelid with ocusoft eye wash or Johnson's baby shampoo once daily. Take doxycycline 20 mg tablet by mouth twice a day. #60, 3 refills  Doxycycline should be taken with food to prevent nausea. Do not lay down for 30 minutes after taking. Be cautious with sun exposure and use good sun protection while on this medication. Pregnant women should not take this medication.   F/u with ophthalmology if not resolved  Return for psoriasis follow up in 6-12 months.  Maylene Roes, CMA, am acting as scribe for Darden Dates, MD .  Documentation: I have reviewed the above documentation for accuracy and completeness, and I agree with the above.  Darden Dates, MD

## 2022-09-26 ENCOUNTER — Other Ambulatory Visit: Payer: Self-pay | Admitting: Internal Medicine

## 2022-10-28 ENCOUNTER — Other Ambulatory Visit: Payer: Self-pay

## 2022-10-28 NOTE — Telephone Encounter (Signed)
LMTCB. Need to find out what pharmacy pt is currently using.

## 2022-11-22 IMAGING — MG MM DIGITAL SCREENING BILAT W/ TOMO AND CAD
6 of 10 series · 6 of 30 positions shown · non-contrast
Comparison: Previous exam(s).

CLINICAL DATA: Screening.

EXAM:
DIGITAL SCREENING BILATERAL MAMMOGRAM WITH TOMOSYNTHESIS AND CAD
TECHNIQUE: Bilateral screening digital craniocaudal and mediolateral oblique
mammograms were obtained. Bilateral screening digital breast
tomosynthesis was performed. The images were evaluated with
computer-aided detection.

[L MLO synth-2D]
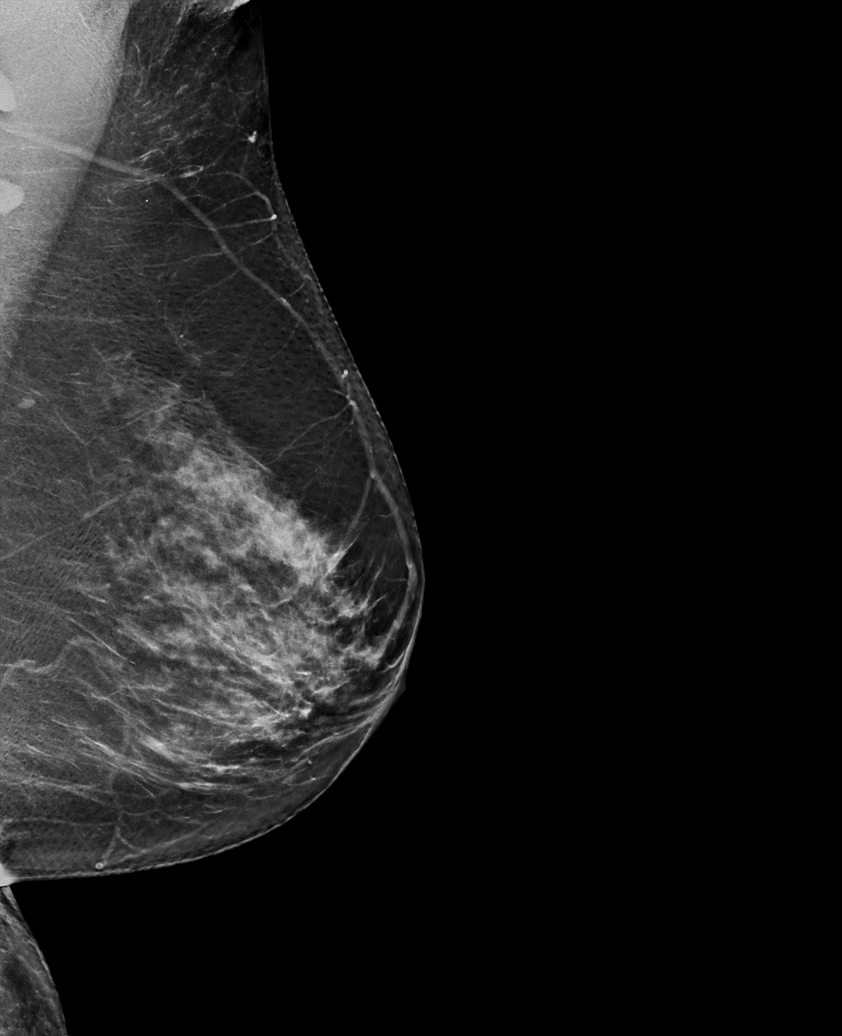

[L CC synth-2D]
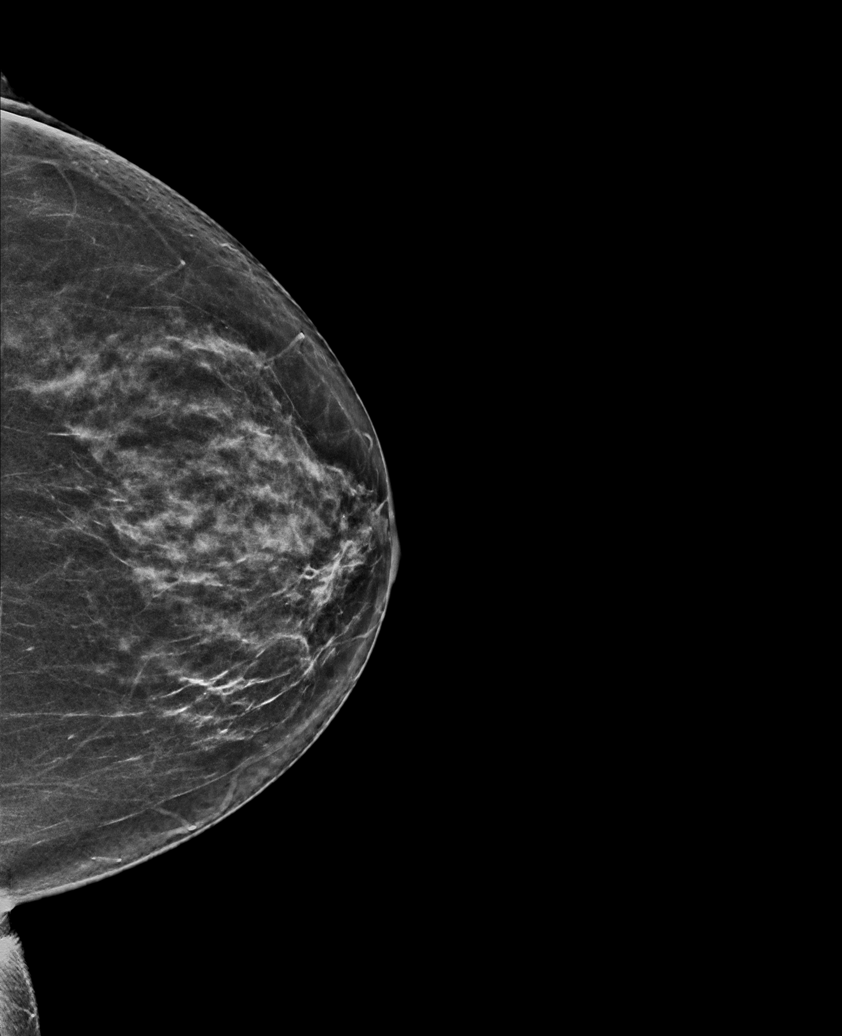

[R MLO synth-2D (1 of 2)]
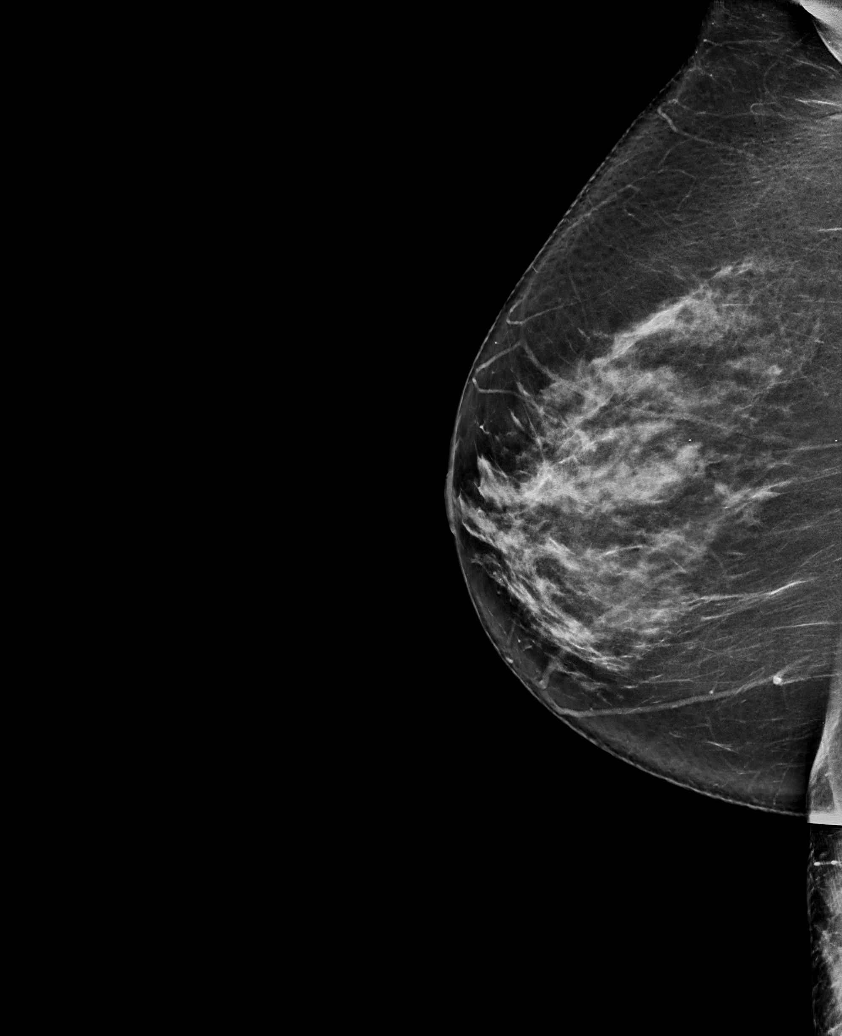

[R MLO synth-2D (2 of 2)]
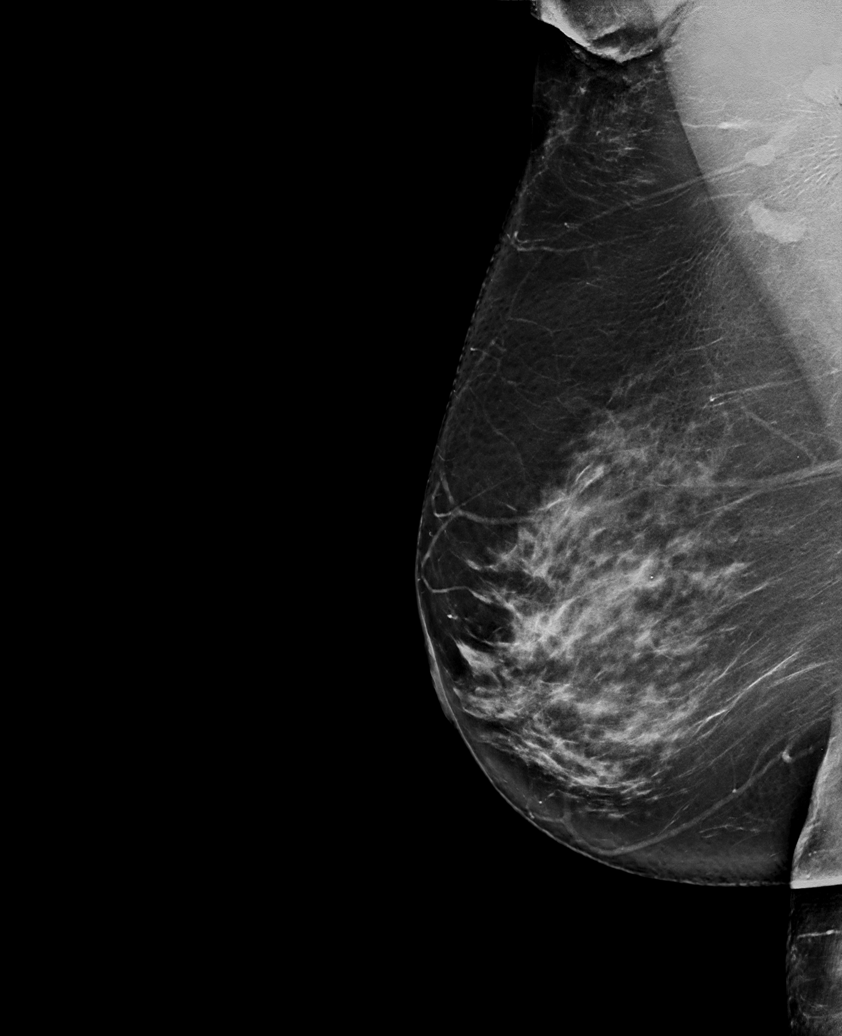

[R CC synth-2D]
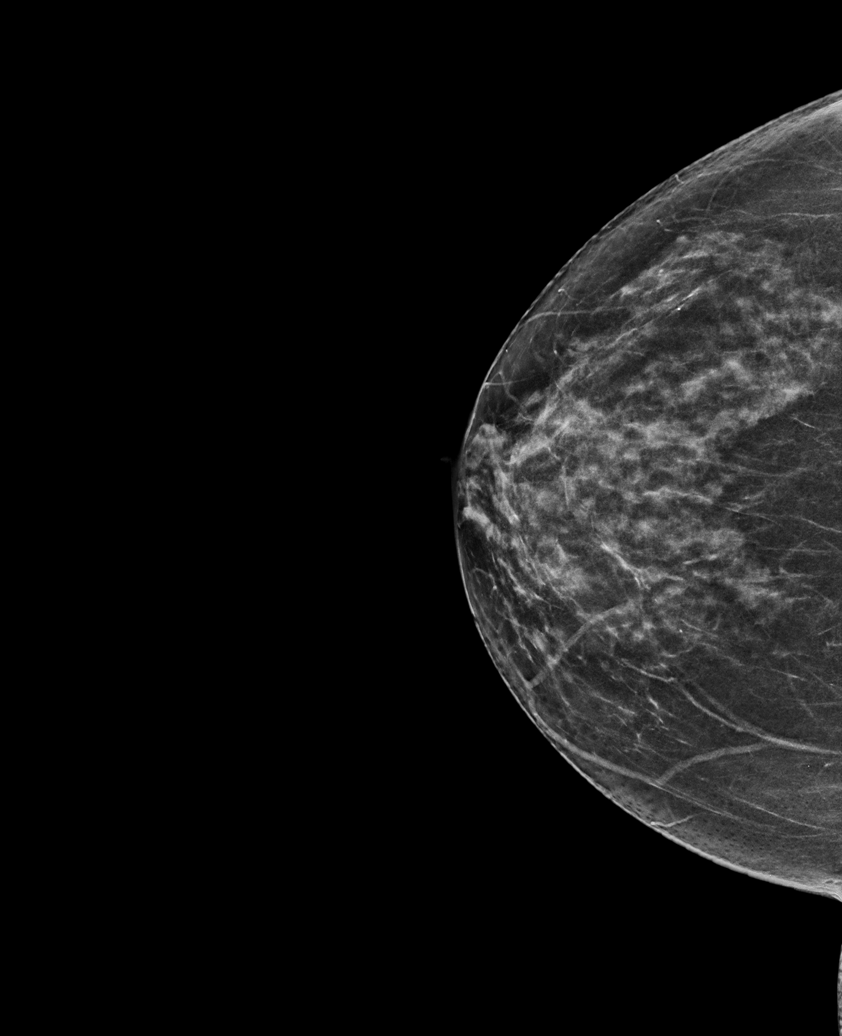

[L MLO tomo · tomo slice 39/76.0]
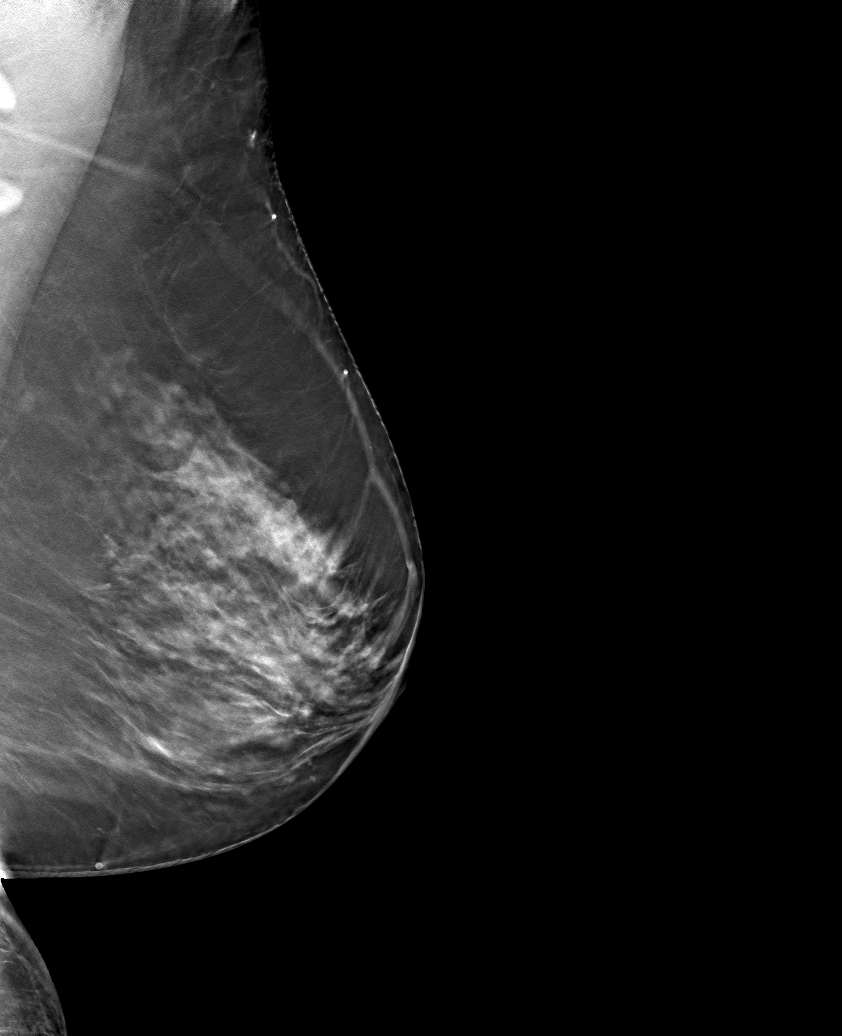

[6 of 30 positions shown; findings below may reference images not displayed]

ACR Breast Density Category c: The breast tissue is heterogeneously
dense, which may obscure small masses.
FINDINGS: There are no findings suspicious for malignancy.
IMPRESSION: No mammographic evidence of malignancy. A result letter of this
screening mammogram will be mailed directly to the patient.

RECOMMENDATION:
Screening mammogram in one year. (Code:[id number])

BI-RADS CATEGORY  1: Negative.

## 2022-11-26 ENCOUNTER — Other Ambulatory Visit: Payer: Self-pay

## 2022-11-26 MED ORDER — MELOXICAM 15 MG PO TABS: 15.0000 mg | ORAL_TABLET | Freq: Every day | ORAL | 1 refills | Status: DC

## 2022-12-14 ENCOUNTER — Other Ambulatory Visit: Payer: Self-pay

## 2022-12-14 ENCOUNTER — Telehealth: Payer: Self-pay | Admitting: Internal Medicine

## 2022-12-14 MED ORDER — TRIAMTERENE-HCTZ 37.5-25 MG PO CAPS: 1.0000 | ORAL_CAPSULE | Freq: Every morning | ORAL | 0 refills | Status: DC

## 2022-12-14 MED ORDER — OMEPRAZOLE 20 MG PO CPDR: DELAYED_RELEASE_CAPSULE | ORAL | 3 refills | Status: DC

## 2022-12-14 NOTE — Telephone Encounter (Signed)
Prescription Request  12/14/2022  LOV: 08/26/2022  What is the name of the medication or equipment? omeprazole   Have you contacted your pharmacy to request a refill? Yes   Which pharmacy would you like this sent to?  [facility] [place]  Patient notified that their request is being sent to the clinical staff for review and that they should receive a response within 2 business days.   Please advise at Mobile [phone] (mobile)

## 2022-12-14 NOTE — Telephone Encounter (Signed)
Medication has been refilled.

## 2022-12-18 MED ORDER — TRIAMTERENE-HCTZ 37.5-25 MG PO CAPS: 1.0000 | ORAL_CAPSULE | Freq: Every morning | ORAL | 0 refills | Status: DC

## 2022-12-18 MED ORDER — OMEPRAZOLE 20 MG PO CPDR: DELAYED_RELEASE_CAPSULE | ORAL | 1 refills | Status: DC

## 2022-12-18 NOTE — Addendum Note (Signed)
Addended by: Benedict Needy on: 12/18/2022 12:02 PM   Modules accepted: Orders

## 2022-12-18 NOTE — Telephone Encounter (Signed)
Pt called stating the medication was sent to the wrong pharmacy and it was supposed to go to [facility] in [place]

## 2022-12-23 ENCOUNTER — Ambulatory Visit (INDEPENDENT_AMBULATORY_CARE_PROVIDER_SITE_OTHER): Payer: 59

## 2022-12-23 ENCOUNTER — Ambulatory Visit: Payer: 59 | Admitting: Podiatry

## 2022-12-23 ENCOUNTER — Encounter: Payer: Self-pay | Admitting: Podiatry

## 2022-12-23 DIAGNOSIS — M7662 Achilles tendinitis, left leg: Secondary | ICD-10-CM

## 2022-12-23 DIAGNOSIS — M7661 Achilles tendinitis, right leg: Secondary | ICD-10-CM | POA: Diagnosis not present

## 2022-12-23 MED ORDER — MELOXICAM 15 MG PO TABS: 15.0000 mg | ORAL_TABLET | Freq: Every day | ORAL | 3 refills | Status: DC

## 2022-12-23 MED ORDER — METHYLPREDNISOLONE 4 MG PO TBPK: ORAL_TABLET | ORAL | 0 refills | Status: DC

## 2022-12-23 NOTE — Progress Notes (Signed)
Subjective:  Patient ID: Vanessa Richards, female    DOB: [birth year],  MRN: [id number] HPI Chief Complaint  Patient presents with   Foot Pain    Posterior heel left - sharp, burning x few months, pressure against it sends a pain up the heel, would like new orthotics, treated previously for achilles tendonitis on the right - which she feels a twinge every now and then, wore a boot for awhile   New Patient (Initial Visit)    Est pt 2019    62 y.o. female presents with the above complaint.   ROS: Denies fever chills nausea by muscle aches pains calf pain back pain chest pain shortness of breath.  Past Medical History:  Diagnosis Date   COVID-19 06/03/2020   GERD (gastroesophageal reflux disease)    Hyperlipidemia    Hypertension    Migraine syndrome    Past Surgical History:  Procedure Laterality Date   COLONOSCOPY WITH PROPOFOL N/A 03/15/2015   Procedure: COLONOSCOPY WITH PROPOFOL;  Surgeon: Christena Deem, MD;  Location: [facility] ENDOSCOPY;  Service: Endoscopy;  Laterality: N/A;   DILATION AND CURETTAGE OF UTERUS     ESOPHAGOGASTRODUODENOSCOPY (EGD) WITH PROPOFOL N/A 03/15/2015   Procedure: ESOPHAGOGASTRODUODENOSCOPY (EGD) WITH PROPOFOL;  Surgeon: Christena Deem, MD;  Location: [facility] ENDOSCOPY;  Service: Endoscopy;  Laterality: N/A;   TUBAL LIGATION      Current Outpatient Medications:    meloxicam (MOBIC) 15 MG tablet, Take 1 tablet (15 mg total) by mouth daily., Disp: 30 tablet, Rfl: 3   methylPREDNISolone (MEDROL DOSEPAK) 4 MG TBPK tablet, 6 day dose pack - take as directed, Disp: 21 tablet, Rfl: 0   ALPRAZolam (XANAX) 0.5 MG tablet, TAKE ONE TABLET BY MOUTH AT BEDTIME AS NEEDED FOR ANXIETY, Disp: 30 tablet, Rfl: 4   betamethasone dipropionate 0.05 % cream, Apply topically 2 (two) times daily. Until rash has resolved, Disp: 45 g, Rfl: 2   Calcipotriene 0.005 % solution, Apply 1 application  topically See admin instructions. Use nightly the day before you shampoo hair.  Leave, Disp: 60 mL, Rfl: 4   clobetasol (TEMOVATE) 0.05 % external solution, Apply topically bid for 2 weeks for itchy rash at ears and behind ears. After 2 weeks use only bid on weekends only. Can use twice daily for itchy areas of scalp prn. Avoid applying to face, groin, and axilla, Disp: 50 mL, Rfl: 4   escitalopram (LEXAPRO) 10 MG tablet, Take 1 tablet (10 mg total) by mouth daily., Disp: 90 tablet, Rfl: 1   fluticasone (FLONASE) 50 MCG/ACT nasal spray, Place 2 sprays into both nostrils daily., Disp: 16 g, Rfl: 5   loratadine (CLARITIN) 10 MG tablet, TAKE ONE TABLET BY MOUTH ONCE DAILY, Disp: 30 tablet, Rfl: 1   meloxicam (MOBIC) 15 MG tablet, Take 1 tablet (15 mg total) by mouth daily., Disp: 15 tablet, Rfl: 1   metoprolol succinate (TOPROL-XL) 100 MG 24 hr tablet, Take with or immediately following a meal., Disp: 90 tablet, Rfl: 1   omeprazole (PRILOSEC) 20 MG capsule, TAKE 1 CAPSULE BY MOUTH EVERY DAY, Disp: 90 capsule, Rfl: 1   SUMAtriptan (IMITREX) 25 MG tablet, Take 1 tablet at the onset of headache) every 2 hours as needed  not to exceed 8 in 24 hours, Disp: 9 tablet, Rfl: 0   Tapinarof (VTAMA) 1 % CREA, Apply to affected skin once a day, Disp: 60 g, Rfl: 2   traZODone (DESYREL) 50 MG tablet, Take 0.5-1 tablets (25-50 mg total) by mouth at  bedtime as needed for sleep., Disp: 90 tablet, Rfl: 3   triamterene-hydrochlorothiazide (DYAZIDE) 37.5-25 MG capsule, Take 1 each (1 capsule total) by mouth every morning., Disp: 90 capsule, Rfl: 0  Allergies  Allergen Reactions   Tricor [Fenofibrate] Other (See Comments)    Intolerance    Review of Systems Objective:  There were no vitals filed for this visit.  General: Well developed, nourished, in no acute distress, alert and oriented x3   Dermatological: Skin is warm, dry and supple bilateral. Nails x 10 are well maintained; remaining integument appears unremarkable at this time. There are no open sores, no preulcerative lesions, no rash or  signs of infection present.  Vascular: Dorsalis Pedis artery and Posterior Tibial artery pedal pulses are 2/4 bilateral with immedate capillary fill time. Pedal hair growth present. No varicosities and no lower extremity edema present bilateral.   Neruologic: Grossly intact via light touch bilateral. Vibratory intact via tuning fork bilateral. Protective threshold with Semmes Wienstein monofilament intact to all pedal sites bilateral. Patellar and Achilles deep tendon reflexes 2+ bilateral. No Babinski or clonus noted bilateral.   Musculoskeletal: No gross boney pedal deformities bilateral. No pain, crepitus, or limitation noted with foot and ankle range of motion bilateral. Muscular strength 5/5 in all groups tested bilateral.  She has severe pain on palpation of the Achilles at its insertion site in the left heel.  No surrounding erythema cellulitis drainage or odor.  She has good plantarflexion against resistance and dorsiflexion with no pain.  Gait: Unassisted, Nonantalgic.    Radiographs:  Radiographs of the left heel taken today demonstrating an osseously mature individual bone mineralization.  Small retrocalcaneal heel spur with minimal thickening of the Achilles at its insertion site.  No fractures are identified.  Assessment & Plan:   Assessment: Insertional Achilles tendinitis left cannot rule out a retro-Achilles bursitis.  Plan: Recommended that she get back into her cam boot which she has at home.  We dispensed a night splint.  Discussed ice therapy.  Started her on methylprednisolone to be followed by meloxicam.  And I will follow-up with her in 1 month.  We will discuss new orthotics at that time.     Allyssia Skluzacek T. [place], [place]

## 2023-01-20 ENCOUNTER — Telehealth: Payer: Self-pay | Admitting: Internal Medicine

## 2023-01-20 DIAGNOSIS — I10 Essential (primary) hypertension: Secondary | ICD-10-CM

## 2023-01-20 DIAGNOSIS — E785 Hyperlipidemia, unspecified: Secondary | ICD-10-CM

## 2023-01-20 MED ORDER — METOPROLOL SUCCINATE ER 100 MG PO TB24
ORAL_TABLET | ORAL | 1 refills | Status: DC
Start: 2023-01-20 — End: 2023-07-20

## 2023-01-20 NOTE — Telephone Encounter (Signed)
refilled 

## 2023-01-20 NOTE — Telephone Encounter (Signed)
REFERRAL TO CARDIOLOGY FOR THE CORONARY CT SCAN DOE

## 2023-01-20 NOTE — Telephone Encounter (Signed)
Pt is aware.  

## 2023-01-20 NOTE — Addendum Note (Signed)
Addended by: Sandy Salaam on: 01/20/2023 01:12 PM   Modules accepted: Orders

## 2023-01-20 NOTE — Telephone Encounter (Signed)
Patient wanted to know if she could get Life line screening done. She just wants to know if its okay for her to do this. It cost 100 dollars. One of her neighbors did this. Her number is [phone]. Could someone give a call

## 2023-01-20 NOTE — Telephone Encounter (Signed)
Prescription Request  01/20/2023  LOV: 08/26/2022  What is the name of the medication or equipment?  metoprolol succinate (TOPROL-XL) 100 MG 24 hr tablet   Have you contacted your pharmacy to request a refill? No   Which pharmacy would you like this sent to?   [facility] in [place]    Patient notified that their request is being sent to the clinical staff for review and that they should receive a response within 2 business days.   Please advise at Mobile [phone] (mobile)

## 2023-01-20 NOTE — Telephone Encounter (Signed)
Patient called and wanted to change Life line screening to CT Cardiac Scoring that would need a referral.

## 2023-02-15 ENCOUNTER — Other Ambulatory Visit: Payer: Self-pay | Admitting: Internal Medicine

## 2023-03-03 ENCOUNTER — Ambulatory Visit: Payer: 59 | Attending: Internal Medicine

## 2023-03-03 ENCOUNTER — Encounter: Payer: Self-pay | Admitting: Internal Medicine

## 2023-03-03 ENCOUNTER — Ambulatory Visit: Payer: 59 | Admitting: Internal Medicine

## 2023-03-03 VITALS — BP 134/84 | HR 84 | Ht 63.5 in | Wt 175.6 lb

## 2023-03-03 DIAGNOSIS — R7303 Prediabetes: Secondary | ICD-10-CM

## 2023-03-03 DIAGNOSIS — Z1231 Encounter for screening mammogram for malignant neoplasm of breast: Secondary | ICD-10-CM

## 2023-03-03 DIAGNOSIS — I10 Essential (primary) hypertension: Secondary | ICD-10-CM | POA: Diagnosis not present

## 2023-03-03 DIAGNOSIS — R002 Palpitations: Secondary | ICD-10-CM

## 2023-03-03 DIAGNOSIS — R0683 Snoring: Secondary | ICD-10-CM

## 2023-03-03 DIAGNOSIS — L259 Unspecified contact dermatitis, unspecified cause: Secondary | ICD-10-CM

## 2023-03-03 DIAGNOSIS — Z6830 Body mass index (BMI) 30.0-30.9, adult: Secondary | ICD-10-CM | POA: Diagnosis not present

## 2023-03-03 DIAGNOSIS — E669 Obesity, unspecified: Secondary | ICD-10-CM | POA: Diagnosis not present

## 2023-03-03 DIAGNOSIS — F411 Generalized anxiety disorder: Secondary | ICD-10-CM | POA: Diagnosis not present

## 2023-03-03 MED ORDER — ESCITALOPRAM OXALATE 10 MG PO TABS: 10.0000 mg | ORAL_TABLET | Freq: Every day | ORAL | 1 refills | Status: DC

## 2023-03-03 MED ORDER — BETAMETHASONE DIPROPIONATE 0.05 % EX CREA: TOPICAL_CREAM | Freq: Two times a day (BID) | CUTANEOUS | 2 refills | Status: AC

## 2023-03-03 MED ORDER — ALPRAZOLAM 0.5 MG PO TABS: ORAL_TABLET | ORAL | 5 refills | Status: DC

## 2023-03-03 NOTE — Assessment & Plan Note (Signed)
Secondary to a toilet paper   Steroid cream prescribed for twice daily use until resolved.

## 2023-03-03 NOTE — Assessment & Plan Note (Addendum)
Managed with prn alprazolam ; requesting to  continue lexapro given new stressors of huband's cognitive decline

## 2023-03-03 NOTE — Assessment & Plan Note (Addendum)
Occurring sporadically,  unprovoked,  with accompanying dyspnea  I have ordered and reviewed a 12 lead EKG and find that there are no acute changes and patient is in sinus rhythm.   However there i s evidence of LAE , which may increase her risk for atrial fibrillation.   Checking thyroid , lytes, and CBC. I am ordering a ZIO monitor for her  to wear for 2 weeks .  She has a cardiology referral in place from last OV< hopefully the monitor will be placed and resulted before her appt

## 2023-03-03 NOTE — Progress Notes (Addendum)
Subjective:  Patient ID: Vanessa Richards, female    DOB: 04/15/1961  Age: 62 y.o. MRN: [id number]  CC: The primary encounter diagnosis was Encounter for screening mammogram for malignant neoplasm of breast. Diagnoses of Primary hypertension, Obesity (BMI 30-39.9), Prediabetes, Generalized anxiety disorder, Palpitations, Contact dermatitis and eczema, and Snoring were also pertinent to this visit.   HPI Vanessa Richards presents for  Chief Complaint  Patient presents with   Medical Management of Chronic Issues    6 month follow up    Stress:  aggravated by husband's mental  status changes and forgetfulness which have made their conflict worse.  husband's recent GI bleed and hypertensive crisis following a colonoscopy  , resulting in hospitalization . He has been more forgetful  with names and work procedures since the hospitalization; she worries that he had a CVA during his event  .  She would like to resume lexapro as she has been using alprazolam prn insomnia,  panic   2) Palpitations:  she has been having episodes of rapid heart rate spontaneously, accompanied by dyspnea, despite taking metoprolol for HTN and migraine syndrome.  Denies chest pain.  Has cardiology referral in place for assessment of CAD .  3) Fatigue:  told she snores  loudly by husband and son.       Outpatient Medications Prior to Visit  Medication Sig Dispense Refill   Calcipotriene 0.005 % solution Apply 1 application  topically See admin instructions. Use nightly the day before you shampoo hair. Leave 60 mL 4   fluticasone (FLONASE) 50 MCG/ACT nasal spray Place 2 sprays into both nostrils daily. 16 g 5   loratadine (CLARITIN) 10 MG tablet TAKE ONE TABLET BY MOUTH ONCE DAILY 30 tablet 1   meloxicam (MOBIC) 15 MG tablet Take 1 tablet (15 mg total) by mouth daily. 30 tablet 3   metoprolol succinate (TOPROL-XL) 100 MG 24 hr tablet Take with or immediately following a meal. 90 tablet 1   omeprazole (PRILOSEC) 20 MG  capsule TAKE 1 CAPSULE BY MOUTH EVERY DAY 90 capsule 1   SUMAtriptan (IMITREX) 25 MG tablet Take 1 tablet at the onset of headache) every 2 hours as needed  not to exceed 8 in 24 hours 9 tablet 0   Tapinarof (VTAMA) 1 % CREA Apply to affected skin once a day 60 g 2   traZODone (DESYREL) 50 MG tablet Take 0.5-1 tablets (25-50 mg total) by mouth at bedtime as needed for sleep. 90 tablet 3   triamterene-hydrochlorothiazide (DYAZIDE) 37.5-25 MG capsule Take 1 each (1 capsule total) by mouth every morning. 90 capsule 0   ALPRAZolam (XANAX) 0.5 MG tablet TAKE ONE TABLET BY MOUTH AT BEDTIME AS NEEDED FOR ANXIETY 30 tablet 4   betamethasone dipropionate 0.05 % cream Apply topically 2 (two) times daily. Until rash has resolved 45 g 2   clobetasol (TEMOVATE) 0.05 % external solution Apply topically bid for 2 weeks for itchy rash at ears and behind ears. After 2 weeks use only bid on weekends only. Can use twice daily for itchy areas of scalp prn. Avoid applying to face, groin, and axilla 50 mL 4   escitalopram (LEXAPRO) 10 MG tablet Take 1 tablet (10 mg total) by mouth daily. 90 tablet 1   meloxicam (MOBIC) 15 MG tablet Take 1 tablet (15 mg total) by mouth daily. (Patient not taking: Reported on 03/03/2023) 15 tablet 1   methylPREDNISolone (MEDROL DOSEPAK) 4 MG TBPK tablet 6 day dose pack - take  as directed (Patient not taking: Reported on 03/03/2023) 21 tablet 0   No facility-administered medications prior to visit.    Review of Systems;  Patient denies headache, fevers, malaise, unintentional weight loss, skin rash, eye pain, sinus congestion and sinus pain, sore throat, dysphagia,  hemoptysis , cough, dyspnea, wheezing, chest pain, palpitations, orthopnea, edema, abdominal pain, nausea, melena, diarrhea, constipation, flank pain, dysuria, hematuria, urinary  Frequency, nocturia, numbness, tingling, seizures,  Focal weakness, Loss of consciousness,  Tremor, insomnia, depression, anxiety, and suicidal  ideation.      Objective:  BP 134/84   Pulse 84   Ht 5' 3.5" (1.613 m)   Wt 175 lb 9.6 oz (79.7 kg)   SpO2 96%   BMI 30.62 kg/m   BP Readings from Last 3 Encounters:  03/03/23 134/84  09/02/22 (!) 141/87  08/26/22 130/84    Wt Readings from Last 3 Encounters:  03/03/23 175 lb 9.6 oz (79.7 kg)  08/26/22 173 lb 6.4 oz (78.7 kg)  05/22/22 174 lb (78.9 kg)    Physical Exam Vitals reviewed.  Constitutional:      General: She is not in acute distress.    Appearance: Normal appearance. She is normal weight. She is not ill-appearing, toxic-appearing or diaphoretic.  HENT:     Head: Normocephalic.  Eyes:     General: No scleral icterus.       Right eye: No discharge.        Left eye: No discharge.     Conjunctiva/sclera: Conjunctivae normal.  Cardiovascular:     Rate and Rhythm: Normal rate and regular rhythm.     Heart sounds: Normal heart sounds.  Pulmonary:     Effort: Pulmonary effort is normal. No respiratory distress.     Breath sounds: Normal breath sounds.  Musculoskeletal:        General: Normal range of motion.  Skin:    General: Skin is warm and dry.  Neurological:     General: No focal deficit present.     Mental Status: She is alert and oriented to person, place, and time. Mental status is at baseline.  Psychiatric:        Mood and Affect: Mood normal.        Behavior: Behavior normal.        Thought Content: Thought content normal.        Judgment: Judgment normal.    Lab Results  Component Value Date   HGBA1C 5.7 03/03/2023   HGBA1C 5.7 08/26/2022   HGBA1C 5.7 11/06/2021    Lab Results  Component Value Date   CREATININE 1.23 (H) 03/03/2023   CREATININE 0.79 08/26/2022   CREATININE 0.76 02/17/2022    Lab Results  Component Value Date   WBC 11.1 (H) 03/03/2023   HGB 13.9 03/03/2023   HCT 42.7 03/03/2023   PLT 359.0 03/03/2023   GLUCOSE 82 03/03/2023   CHOL 192 08/26/2022   TRIG 261.0 (H) 08/26/2022   HDL 50.90 08/26/2022   LDLDIRECT  110.0 08/26/2022   LDLCALC 119 (H) 02/17/2022   ALT 13 03/03/2023   AST 17 03/03/2023   NA 140 03/03/2023   K 3.6 03/03/2023   CL 101 03/03/2023   CREATININE 1.23 (H) 03/03/2023   BUN 14 03/03/2023   CO2 27 03/03/2023   TSH 2.65 03/03/2023   HGBA1C 5.7 03/03/2023   MICROALBUR 0.8 08/26/2022    MM 3D SCREEN BREAST BILATERAL  Result Date: 01/14/2022 CLINICAL DATA:  Screening. EXAM: DIGITAL SCREENING BILATERAL MAMMOGRAM WITH TOMOSYNTHESIS  AND CAD TECHNIQUE: Bilateral screening digital craniocaudal and mediolateral oblique mammograms were obtained. Bilateral screening digital breast tomosynthesis was performed. The images were evaluated with computer-aided detection. COMPARISON:  Previous exam(s). ACR Breast Density Category c: The breast tissue is heterogeneously dense, which may obscure small masses. FINDINGS: There are no findings suspicious for malignancy. IMPRESSION: No mammographic evidence of malignancy. A result letter of this screening mammogram will be mailed directly to the patient. RECOMMENDATION: Screening mammogram in one year. (Code:SM-B-01Y) BI-RADS CATEGORY  1: Negative. Electronically Signed   By: Harmon Pier M.D.   On: 01/14/2022 16:20    Assessment & Plan:  .Encounter for screening mammogram for malignant neoplasm of breast -     3D Screening Mammogram, Left and Right; Future  Primary hypertension Assessment & Plan: Mnaged with metoprolol and maxzide  Lab Results  Component Value Date   CREATININE 0.79 08/26/2022   Lab Results  Component Value Date   MICROALBUR 0.8 08/26/2022   MICROALBUR 0.6 05/16/2021     Lab Results  Component Value Date   NA 138 08/26/2022   K 4.0 08/26/2022   CL 100 08/26/2022   CO2 30 08/26/2022     Orders: -     Comprehensive metabolic panel -     PSG SLEEP STUDY; Future  Obesity (BMI 30-39.9) -     PSG SLEEP STUDY; Future  Prediabetes Assessment & Plan: She has unprovoked episodes  of r acing heart accompanied by  dyspnea. Using alprazolam prn.  Baseline EKG aoand ZIO ordered   Orders: -     Comprehensive metabolic panel -     Hemoglobin A1c  Generalized anxiety disorder Assessment & Plan: Managed with prn alprazolam ; requesting to  continue lexapro given new stressors of huband's cognitive decline    Palpitations Assessment & Plan: Occurring sporadically,  unprovoked,  with accompanying dyspnea  I have ordered and reviewed a 12 lead EKG and find that there are no acute changes and patient is in sinus rhythm.   However there i s evidence of LAE , which may increase her risk for atrial fibrillation.   Checking thyroid , lytes, and CBC. I am ordering a ZIO monitor for her  to wear for 2 weeks .  She has a cardiology referral in place from last OV< hopefully the monitor will be placed and resulted before her appt    Orders: -     TSH -     CBC with Differential/Platelet -     LONG TERM MONITOR (3-14 DAYS); Future -     EKG 12-Lead -     PSG SLEEP STUDY; Future  Contact dermatitis and eczema Assessment & Plan: Secondary to a toilet paper   Steroid cream prescribed for twice daily use until resolved.    Snoring Assessment & Plan: With obesity, hypertension, palpitations,  and fatigue.  OSA suspected,  Sleep study ordered   Orders: -     PSG SLEEP STUDY; Future  Other orders -     ALPRAZolam; TAKE ONE TABLET BY MOUTH AT BEDTIME AS NEEDED FOR ANXIETY  Dispense: 30 tablet; Refill: 5 -     Betamethasone Dipropionate; Apply topically 2 (two) times daily. Until rash has resolved  Dispense: 45 g; Refill: 2 -     Escitalopram Oxalate; Take 1 tablet (10 mg total) by mouth daily.  Dispense: 90 tablet; Refill: 1     Follow-up: No follow-ups on file.   Sherlene Shams, MD

## 2023-03-03 NOTE — Patient Instructions (Signed)
  I am ordering a ZIO monitor for you to wear for 2 weeks to monitor your heart rhythms .  It will come to your house and you can apply it to your chest wall.  If you have any trouble applying it you can schedule a RN visit in our office and we will help you

## 2023-03-03 NOTE — Assessment & Plan Note (Addendum)
She has unprovoked episodes  of r acing heart accompanied by dyspnea. Using alprazolam prn.  Baseline EKG aoand ZIO ordered

## 2023-03-03 NOTE — Assessment & Plan Note (Signed)
Mnaged with metoprolol and maxzide  Lab Results  Component Value Date   CREATININE 0.79 08/26/2022   Lab Results  Component Value Date   MICROALBUR 0.8 08/26/2022   MICROALBUR 0.6 05/16/2021     Lab Results  Component Value Date   NA 138 08/26/2022   K 4.0 08/26/2022   CL 100 08/26/2022   CO2 30 08/26/2022

## 2023-03-04 LAB — CBC WITH DIFFERENTIAL/PLATELET
Basophils Absolute: 0.1 10*3/uL (ref 0.0–0.1)
Basophils Relative: 1 % (ref 0.0–3.0)
Eosinophils Absolute: 0.3 10*3/uL (ref 0.0–0.7)
Eosinophils Relative: 3 % (ref 0.0–5.0)
HCT: 42.7 % (ref 36.0–46.0)
Hemoglobin: 13.9 g/dL (ref 12.0–15.0)
Lymphocytes Relative: 25.9 % (ref 12.0–46.0)
Lymphs Abs: 2.9 10*3/uL (ref 0.7–4.0)
MCHC: 32.5 g/dL (ref 30.0–36.0)
MCV: 91.5 fL (ref 78.0–100.0)
Monocytes Absolute: 0.8 10*3/uL (ref 0.1–1.0)
Monocytes Relative: 6.9 % (ref 3.0–12.0)
Neutro Abs: 7 10*3/uL (ref 1.4–7.7)
Neutrophils Relative %: 63.2 % (ref 43.0–77.0)
Platelets: 359 10*3/uL (ref 150.0–400.0)
RBC: 4.66 Mil/uL (ref 3.87–5.11)
RDW: 13.4 % (ref 11.5–15.5)
WBC: 11.1 10*3/uL — ABNORMAL HIGH (ref 4.0–10.5)

## 2023-03-04 LAB — COMPREHENSIVE METABOLIC PANEL
ALT: 13 U/L (ref 0–35)
AST: 17 U/L (ref 0–37)
Albumin: 4.4 g/dL (ref 3.5–5.2)
Alkaline Phosphatase: 88 U/L (ref 39–117)
BUN: 14 mg/dL (ref 6–23)
CO2: 27 meq/L (ref 19–32)
Calcium: 9.8 mg/dL (ref 8.4–10.5)
Chloride: 101 meq/L (ref 96–112)
Creatinine, Ser: 1.23 mg/dL — ABNORMAL HIGH (ref 0.40–1.20)
GFR: 47.26 mL/min — ABNORMAL LOW (ref >60.00)
Glucose, Bld: 82 mg/dL (ref 70–99)
Potassium: 3.6 meq/L (ref 3.5–5.1)
Sodium: 140 meq/L (ref 135–145)
Total Bilirubin: 0.3 mg/dL (ref 0.2–1.2)
Total Protein: 7.6 g/dL (ref 6.0–8.3)

## 2023-03-04 LAB — TSH: TSH: 2.65 u[IU]/mL (ref 0.35–5.50)

## 2023-03-04 LAB — HEMOGLOBIN A1C: Hgb A1c MFr Bld: 5.7 % (ref 4.6–6.5)

## 2023-03-06 DIAGNOSIS — R002 Palpitations: Secondary | ICD-10-CM | POA: Diagnosis not present

## 2023-03-08 ENCOUNTER — Ambulatory Visit: Payer: 59 | Admitting: Dermatology

## 2023-03-15 ENCOUNTER — Encounter: Payer: Self-pay | Admitting: Internal Medicine

## 2023-03-15 DIAGNOSIS — R0683 Snoring: Secondary | ICD-10-CM | POA: Insufficient documentation

## 2023-03-15 NOTE — Addendum Note (Signed)
Addended by: Sherlene Shams on: 03/15/2023 08:21 PM   Modules accepted: Orders

## 2023-03-15 NOTE — Assessment & Plan Note (Signed)
With obesity, hypertension, palpitations,  and fatigue.  OSA suspected,  Sleep study ordered

## 2023-03-26 DIAGNOSIS — R002 Palpitations: Secondary | ICD-10-CM | POA: Diagnosis not present

## 2023-03-27 DIAGNOSIS — G4733 Obstructive sleep apnea (adult) (pediatric): Secondary | ICD-10-CM | POA: Diagnosis not present

## 2023-03-27 DIAGNOSIS — R0602 Shortness of breath: Secondary | ICD-10-CM | POA: Diagnosis not present

## 2023-03-30 ENCOUNTER — Telehealth: Payer: Self-pay

## 2023-03-30 NOTE — Telephone Encounter (Signed)
Sleep study result have been placed in yellow folder.

## 2023-03-31 ENCOUNTER — Telehealth: Payer: Self-pay | Admitting: Internal Medicine

## 2023-03-31 ENCOUNTER — Encounter: Payer: Self-pay | Admitting: Internal Medicine

## 2023-03-31 DIAGNOSIS — G4733 Obstructive sleep apnea (adult) (pediatric): Secondary | ICD-10-CM

## 2023-03-31 NOTE — Telephone Encounter (Signed)
Pt is aware and gave a verbal understanding.  

## 2023-03-31 NOTE — Telephone Encounter (Signed)
REPEAT OVERNIGHT STUDY AFTER INITIATION OF CPDP FOR 2 WEEK S HAS BEEN ADVISED AND ORDERED

## 2023-04-06 ENCOUNTER — Encounter: Payer: Self-pay | Admitting: Cardiology

## 2023-04-06 ENCOUNTER — Ambulatory Visit: Payer: 59 | Attending: Cardiology | Admitting: Cardiology

## 2023-04-06 VITALS — BP 136/80 | HR 73 | Ht 63.0 in | Wt 171.8 lb

## 2023-04-06 DIAGNOSIS — I471 Supraventricular tachycardia, unspecified: Secondary | ICD-10-CM

## 2023-04-06 DIAGNOSIS — E782 Mixed hyperlipidemia: Secondary | ICD-10-CM

## 2023-04-06 DIAGNOSIS — R072 Precordial pain: Secondary | ICD-10-CM | POA: Diagnosis not present

## 2023-04-06 DIAGNOSIS — I10 Essential (primary) hypertension: Secondary | ICD-10-CM | POA: Diagnosis not present

## 2023-04-06 MED ORDER — ATORVASTATIN CALCIUM 20 MG PO TABS: 20.0000 mg | ORAL_TABLET | Freq: Every day | ORAL | 3 refills | Status: DC

## 2023-04-06 MED ORDER — METOPROLOL TARTRATE 100 MG PO TABS: 100.0000 mg | ORAL_TABLET | Freq: Once | ORAL | 0 refills | Status: DC

## 2023-04-06 NOTE — Patient Instructions (Signed)
Medication Instructions:   START Atorvastatin - Take one tablet ( 20mg ) by mouth daily.    *If you need a refill on your cardiac medications before your next appointment, please call your pharmacy*   Lab Work:  Your physician recommends you have labs - BMP   If you have labs (blood work) drawn today and your tests are completely normal, you will receive your results only by: MyChart Message (if you have MyChart) OR A paper copy in the mail If you have any lab test that is abnormal or we need to change your treatment, we will call you to review the results.   Testing/Procedures:  Your physician has requested that you have an echocardiogram. Echocardiography is a painless test that uses sound waves to create images of your heart. It provides your doctor with information about the size and shape of your heart and how well your heart's chambers and valves are working. This procedure takes approximately one hour. There are no restrictions for this procedure. Please do NOT wear cologne, perfume, aftershave, or lotions (deodorant is allowed). Please arrive 15 minutes prior to your appointment time.  Please note: We ask at that you not bring children with you during ultrasound (echo/ vascular) testing. Due to room size and safety concerns, children are not allowed in the ultrasound rooms during exams. Our front office staff cannot provide observation of children in our lobby area while testing is being conducted. An adult accompanying a patient to their appointment will only be allowed in the ultrasound room at the discretion of the ultrasound technician under special circumstances. We apologize for any inconvenience.    Your cardiac CT will be scheduled at one of the below locations:   [facility] [address] [place], [place] [address] [phone]  OR   [facility] [address] [place], [place] [address] [phone]  If scheduled at [facility] or [facility], please arrive 15 mins early for check-in and test prep.  There is spacious parking and easy access to the radiology department from the [facility] V[facility] entrance. Please enter here and check-in with the desk attendant.   Please follow these instructions carefully (unless otherwise directed):  An IV will be required for this test and Nitroglycerin will be given.  Hold all erectile dysfunction medications at least 3 days (72 hrs) prior to test. (Ie viagra, cialis, sildenafil, tadalafil, etc)   On the Night Before the Test: Be sure to Drink plenty of water. Do not consume any caffeinated/decaffeinated beverages or chocolate 12 hours prior to your test. Do not take any antihistamines 12 hours prior to your test.  On the Day of the Test: Drink plenty of water until 1 hour prior to the test. Do not eat any food 1 hour prior to test. You may take your regular medications prior to the test.  Take metoprolol (Lopressor) two hours prior to test. FEMALES- please wear underwire-free bra if available, avoid dresses & tight clothing       After the Test: Drink plenty of water. After receiving IV contrast, you may experience a mild flushed feeling. This is normal. On occasion, you may experience a mild rash up to 24 hours after the test. This is not dangerous. If this occurs, you can take Benadryl 25 mg and increase your fluid intake. If you experience trouble breathing, this can be serious. If it is severe call 911 IMMEDIATELY. If  it is mild, please call our office.  We will call to schedule your test 2-4 weeks out understanding that some insurance companies will need an authorization prior to the service being performed.   For more information and frequently asked questions, please visit our website : [email or web address]  For non-scheduling related questions, please contact  the cardiac imaging nurse navigator should you have any questions/concerns: Cardiac Imaging Nurse Navigators Direct Office Dial: [phone]   For scheduling needs, including cancellations and rescheduling, please call [place], [phone].   Follow-Up: At [facility], you and your health needs are our priority.  As part of our continuing mission to provide you with exceptional heart care, we have created designated Provider Care Teams.  These Care Teams include your primary Cardiologist (physician) and Advanced Practice Providers (APPs -  Physician Assistants and Nurse Practitioners) who all work together to provide you with the care you need, when you need it.  We recommend signing up for the patient portal called "MyChart".  Sign up information is provided on this After Visit Summary.  MyChart is used to connect with patients for Virtual Visits (Telemedicine).  Patients are able to view lab/test results, encounter notes, upcoming appointments, etc.  Non-urgent messages can be sent to your provider as well.   To learn more about what you can do with MyChart, go to [email or web address].    Your next appointment:   8 - 10 week(s)  Provider:   You may see Debbe Odea, MD or one of the following Advanced Practice Providers on your designated Care Team:   Nicolasa Ducking, NP Eula Listen, PA-C Cadence Fransico Michael, PA-C Charlsie Quest, NP Carlos Levering, NP

## 2023-04-06 NOTE — Progress Notes (Signed)
Cardiology Office Note:    Date:  04/06/2023   ID:  Vanessa Richards, DOB [birth year], MRN [id number]  PCP:  Sherlene Shams, MD   [facility] Providers Cardiologist:  Debbe Odea, MD     Referring MD: Sherlene Shams, MD   Chief Complaint  Patient presents with   New Patient (Initial Visit)    Referred for cardiac evaluation of Hyperlipidemia.  No personal cardiac history but did have stress test at [facility] in 2012.   Vanessa Richards is a 62 y.o. female who is being seen today for the evaluation of palpitations at the request of Vanessa Huntsman Mar Daring, MD.   History of Present Illness:    Vanessa Richards is a 62 y.o. female with a hx of hypertension, hyperlipidemia presenting with palpitations and chest tightness.  Symptoms of chest tightness ongoing over several years.  Endorses shortness of breath and worsening left-sided chest tightness with exertion.  Father had a heart attack in his 12s, grandfather had a heart attack in his 55s.  Very worried due to family history of CAD.  She is not sure if her symptoms of palpitations are attributable to stress or recently diagnosed sleep apnea.  Cardiac monitor 03/26/2023 3 occasional paroxysmal SVT, occasional PVCs 2.2%, no significant or sustained arrhythmias, no A-fib.  Outside stress echo 2013 normal EF 55% no evidence for ischemia.  Endorses eating a high carb diet, working on eating healthier.  Did not tolerate fenofibrate in the past due to palpitations.  Past Medical History:  Diagnosis Date   COVID-19 06/03/2020   GERD (gastroesophageal reflux disease)    Hyperlipidemia    Hypertension    Migraine syndrome     Past Surgical History:  Procedure Laterality Date   COLONOSCOPY WITH PROPOFOL N/A 03/15/2015   Procedure: COLONOSCOPY WITH PROPOFOL;  Surgeon: Christena Deem, MD;  Location: [facility] ENDOSCOPY;  Service: Endoscopy;  Laterality: N/A;   DILATION AND CURETTAGE OF UTERUS     ESOPHAGOGASTRODUODENOSCOPY (EGD)  WITH PROPOFOL N/A 03/15/2015   Procedure: ESOPHAGOGASTRODUODENOSCOPY (EGD) WITH PROPOFOL;  Surgeon: Christena Deem, MD;  Location: [facility] ENDOSCOPY;  Service: Endoscopy;  Laterality: N/A;   TUBAL LIGATION      Current Medications: Current Meds  Medication Sig   ALPRAZolam (XANAX) 0.5 MG tablet TAKE ONE TABLET BY MOUTH AT BEDTIME AS NEEDED FOR ANXIETY   atorvastatin (LIPITOR) 20 MG tablet Take 1 tablet (20 mg total) by mouth daily.   betamethasone dipropionate 0.05 % cream Apply topically 2 (two) times daily. Until rash has resolved   Calcipotriene 0.005 % solution Apply 1 application  topically See admin instructions. Use nightly the day before you shampoo hair. Leave   escitalopram (LEXAPRO) 10 MG tablet Take 1 tablet (10 mg total) by mouth daily.   fluticasone (FLONASE) 50 MCG/ACT nasal spray Place 2 sprays into both nostrils daily.   loratadine (CLARITIN) 10 MG tablet TAKE ONE TABLET BY MOUTH ONCE DAILY   meloxicam (MOBIC) 15 MG tablet Take 1 tablet (15 mg total) by mouth daily.   metoprolol succinate (TOPROL-XL) 100 MG 24 hr tablet Take with or immediately following a meal.   omeprazole (PRILOSEC) 20 MG capsule TAKE 1 CAPSULE BY MOUTH EVERY DAY   SUMAtriptan (IMITREX) 25 MG tablet Take 1 tablet at the onset of headache) every 2 hours as needed  not to exceed 8 in 24 hours   Tapinarof (VTAMA) 1 % CREA Apply to affected skin once a day   traZODone (DESYREL) 50  MG tablet Take 0.5-1 tablets (25-50 mg total) by mouth at bedtime as needed for sleep.   triamterene-hydrochlorothiazide (DYAZIDE) 37.5-25 MG capsule Take 1 each (1 capsule total) by mouth every morning.     Allergies:   Tricor [fenofibrate]   Social History   Socioeconomic History   Marital status: Married    Spouse name: Not on file   Number of children: Not on file   Years of education: Not on file   Highest education level: Not on file  Occupational History   Not on file  Tobacco Use   Smoking status: Never    Smokeless tobacco: Never  Vaping Use   Vaping status: Never Used  Substance and Sexual Activity   Alcohol use: Yes    Alcohol/week: 2.0 standard drinks of alcohol    Types: 2 Glasses of wine per week   Drug use: No   Sexual activity: Not on file  Other Topics Concern   Not on file  Social History Narrative   Not on file   Social Determinants of Health   Financial Resource Strain: Not on file  Food Insecurity: Not on file  Transportation Needs: Not on file  Physical Activity: Not on file  Stress: Not on file  Social Connections: Not on file     Family History: The patient's family history includes Arthritis (age of onset: 60) in her sister; Early death in her paternal uncle; Heart disease (age of onset: 55) in her paternal grandfather; Heart disease (age of onset: 72) in her father; Hypertension in her father. There is no history of Breast cancer.  ROS:   Please see the history of present illness.     All other systems reviewed and are negative.  EKGs/Labs/Other Studies Reviewed:    The following studies were reviewed today:  EKG Interpretation Date/Time:  Tuesday April 06 2023 09:37:57 EST Ventricular Rate:  73 PR Interval:  180 QRS Duration:  84 QT Interval:  372 QTC Calculation: 409 R Axis:   41  Text Interpretation: Normal sinus rhythm Normal ECG Confirmed by Debbe Odea ([id number]) on 04/06/2023 9:44:19 AM    Recent Labs: 03/03/2023: ALT 13; BUN 14; Creatinine, Ser 1.23; Hemoglobin 13.9; Platelets 359.0; Potassium 3.6; Sodium 140; TSH 2.65  Recent Lipid Panel    Component Value Date/Time   CHOL 192 08/26/2022 0926   TRIG 261.0 (H) 08/26/2022 0926   HDL 50.90 08/26/2022 0926   CHOLHDL 4 08/26/2022 0926   VLDL 52.2 (H) 08/26/2022 0926   LDLCALC 119 (H) 02/17/2022 0843   LDLDIRECT 110.0 08/26/2022 0926     Risk Assessment/Calculations:             Physical Exam:    VS:  BP 136/80 (BP Location: Left Arm, Patient Position: Sitting, Cuff Size:  Large)   Pulse 73   Ht 5\' 3"  (1.6 m)   Wt 171 lb 12.8 oz (77.9 kg)   SpO2 98%   BMI 30.43 kg/m     Wt Readings from Last 3 Encounters:  04/06/23 171 lb 12.8 oz (77.9 kg)  03/03/23 175 lb 9.6 oz (79.7 kg)  08/26/22 173 lb 6.4 oz (78.7 kg)     GEN:  Well nourished, well developed in no acute distress HEENT: Normal NECK: No JVD; No carotid bruits CARDIAC: RRR, no murmurs, rubs, gallops RESPIRATORY:  Clear to auscultation without rales, wheezing or rhonchi  ABDOMEN: Soft, non-tender, non-distended MUSCULOSKELETAL:  No edema; No deformity  SKIN: Warm and dry NEUROLOGIC:  Alert and oriented  x 3 PSYCHIATRIC:  Normal affect   ASSESSMENT:    1. Precordial pain   2. Mixed hyperlipidemia   3. Primary hypertension   4. Paroxysmal SVT (supraventricular tachycardia) (HCC)    PLAN:    In order of problems listed above:  Chest pain, shortness of breath with exertion, get echo, get coronary CTA. Hyperlipidemia, 10-year ASCVD risk 6.1%.  Start Lipitor 20 mg daily. Hypertension, BP controlled.  Continue Dyazide 37.5-25 mg daily Palpitations, cardiac monitor showed occasional paroxysmal SVT, no significant sustained arrhythmias.  Continue Toprol-XL 100 mg daily.  Treatment of OSA should help with supraventricular arrhythmia.  Follow-up after cardiac testing.     Medication Adjustments/Labs and Tests Ordered: Current medicines are reviewed at length with the patient today.  Concerns regarding medicines are outlined above.  Orders Placed This Encounter  Procedures   CT CORONARY MORPH W/CTA COR W/SCORE W/CA W/CM &/OR WO/CM   Basic Metabolic Panel (BMET)   EKG 12-Lead   ECHOCARDIOGRAM COMPLETE   Meds ordered this encounter  Medications   atorvastatin (LIPITOR) 20 MG tablet    Sig: Take 1 tablet (20 mg total) by mouth daily.    Dispense:  90 tablet    Refill:  3    Patient Instructions  Medication Instructions:   START Atorvastatin - Take one tablet ( 20mg ) by mouth daily.     *If you need a refill on your cardiac medications before your next appointment, please call your pharmacy*   Lab Work:  Your physician recommends you have labs - BMP   If you have labs (blood work) drawn today and your tests are completely normal, you will receive your results only by: MyChart Message (if you have MyChart) OR A paper copy in the mail If you have any lab test that is abnormal or we need to change your treatment, we will call you to review the results.   Testing/Procedures:  Your physician has requested that you have an echocardiogram. Echocardiography is a painless test that uses sound waves to create images of your heart. It provides your doctor with information about the size and shape of your heart and how well your heart's chambers and valves are working. This procedure takes approximately one hour. There are no restrictions for this procedure. Please do NOT wear cologne, perfume, aftershave, or lotions (deodorant is allowed). Please arrive 15 minutes prior to your appointment time.  Please note: We ask at that you not bring children with you during ultrasound (echo/ vascular) testing. Due to room size and safety concerns, children are not allowed in the ultrasound rooms during exams. Our front office staff cannot provide observation of children in our lobby area while testing is being conducted. An adult accompanying a patient to their appointment will only be allowed in the ultrasound room at the discretion of the ultrasound technician under special circumstances. We apologize for any inconvenience.    Your cardiac CT will be scheduled at one of the below locations:   [facility] [address] [place], [place] [address] [phone]  OR   [facility] [address] [place], [place] [address] [phone]  If scheduled at [facility] or [facility], please arrive 15 mins early for check-in and test prep.  There is spacious parking and easy access to the radiology department from the [facility] entrance. Please enter here and check-in with the desk attendant.   Please follow  these instructions carefully (unless otherwise directed):  An IV will be required for this test and Nitroglycerin will be given.  Hold all erectile dysfunction medications at least 3 days (72 hrs) prior to test. (Ie viagra, cialis, sildenafil, tadalafil, etc)   On the Night Before the Test: Be sure to Drink plenty of water. Do not consume any caffeinated/decaffeinated beverages or chocolate 12 hours prior to your test. Do not take any antihistamines 12 hours prior to your test.  On the Day of the Test: Drink plenty of water until 1 hour prior to the test. Do not eat any food 1 hour prior to test. You may take your regular medications prior to the test.  Take metoprolol (Lopressor) two hours prior to test. FEMALES- please wear underwire-free bra if available, avoid dresses & tight clothing       After the Test: Drink plenty of water. After receiving IV contrast, you may experience a mild flushed feeling. This is normal. On occasion, you may experience a mild rash up to 24 hours after the test. This is not dangerous. If this occurs, you can take Benadryl 25 mg and increase your fluid intake. If you experience trouble breathing, this can be serious. If it is severe call 911 IMMEDIATELY. If it is mild, please call our office.  We will call to schedule your test 2-4 weeks out understanding that some insurance companies will need an authorization prior to the service being performed.   For more information and frequently asked questions, please visit our website : [email or web address]  For non-scheduling related questions, please contact the cardiac imaging nurse navigator should you have any questions/concerns: Cardiac  Imaging Nurse Navigators Direct Office Dial: [phone]   For scheduling needs, including cancellations and rescheduling, please call [place], [phone].   Follow-Up: At [facility], you and your health needs are our priority.  As part of our continuing mission to provide you with exceptional heart care, we have created designated Provider Care Teams.  These Care Teams include your primary Cardiologist (physician) and Advanced Practice Providers (APPs -  Physician Assistants and Nurse Practitioners) who all work together to provide you with the care you need, when you need it.  We recommend signing up for the patient portal called "MyChart".  Sign up information is provided on this After Visit Summary.  MyChart is used to connect with patients for Virtual Visits (Telemedicine).  Patients are able to view lab/test results, encounter notes, upcoming appointments, etc.  Non-urgent messages can be sent to your provider as well.   To learn more about what you can do with MyChart, go to [email or web address].    Your next appointment:   8 - 10 week(s)  Provider:   You may see Debbe Odea, MD or one of the following Advanced Practice Providers on your designated Care Team:   Nicolasa Ducking, NP Eula Listen, PA-C Cadence Fransico Michael, PA-C Charlsie Quest, NP Carlos Levering, NP    Signed, Debbe Odea, MD  04/06/2023 10:20 AM    [facility]

## 2023-04-06 NOTE — Addendum Note (Signed)
Addended by: Margrett Rud on: 04/06/2023 11:46 AM   Modules accepted: Orders

## 2023-04-07 DIAGNOSIS — G4733 Obstructive sleep apnea (adult) (pediatric): Secondary | ICD-10-CM | POA: Diagnosis not present

## 2023-04-07 LAB — BASIC METABOLIC PANEL
BUN/Creatinine Ratio: 11 — ABNORMAL LOW (ref 12–28)
BUN: 10 mg/dL (ref 8–27)
CO2: 27 mmol/L (ref 20–29)
Calcium: 9.8 mg/dL (ref 8.7–10.3)
Chloride: 103 mmol/L (ref 96–106)
Creatinine, Ser: 0.89 mg/dL (ref 0.57–1.00)
Glucose: 74 mg/dL (ref 70–99)
Potassium: 3.9 mmol/L (ref 3.5–5.2)
Sodium: 146 mmol/L — ABNORMAL HIGH (ref 134–144)
eGFR: 73 mL/min/{1.73_m2} (ref >59)

## 2023-04-14 DIAGNOSIS — D23112 Other benign neoplasm of skin of right lower eyelid, including canthus: Secondary | ICD-10-CM | POA: Diagnosis not present

## 2023-04-14 DIAGNOSIS — M3501 Sicca syndrome with keratoconjunctivitis: Secondary | ICD-10-CM | POA: Diagnosis not present

## 2023-04-14 DIAGNOSIS — H02889 Meibomian gland dysfunction of unspecified eye, unspecified eyelid: Secondary | ICD-10-CM | POA: Diagnosis not present

## 2023-04-14 DIAGNOSIS — H04222 Epiphora due to insufficient drainage, left lacrimal gland: Secondary | ICD-10-CM | POA: Diagnosis not present

## 2023-04-15 ENCOUNTER — Ambulatory Visit: Payer: 59 | Attending: Cardiology

## 2023-04-15 DIAGNOSIS — R072 Precordial pain: Secondary | ICD-10-CM

## 2023-04-15 LAB — ECHOCARDIOGRAM COMPLETE
Area-P 1/2: 3.42 cm2
S' Lateral: 2.8 cm

## 2023-04-15 MED ORDER — PERFLUTREN LIPID MICROSPHERE
1.0000 mL | INTRAVENOUS | Status: AC | PRN
  Administered 2023-04-15: 3 mL via INTRAVENOUS

## 2023-04-20 ENCOUNTER — Encounter (HOSPITAL_COMMUNITY): Payer: Self-pay

## 2023-04-21 ENCOUNTER — Telehealth (HOSPITAL_COMMUNITY): Payer: Self-pay | Admitting: *Deleted

## 2023-04-21 NOTE — Telephone Encounter (Signed)
Reaching out to patient to offer assistance regarding upcoming cardiac imaging study; pt verbalizes understanding of appt date/time, parking situation and where to check in, pre-test NPO status and medications ordered, and verified current allergies; name and call back number provided for further questions should they arise Hayley Sharpe RN Navigator Cardiac Imaging [facility] [phone] office [phone] cell  

## 2023-04-22 ENCOUNTER — Ambulatory Visit
Admission: RE | Admit: 2023-04-22 | Discharge: 2023-04-22 | Disposition: A | Discharge status: Home / Self Care | Payer: 59 | Source: Ambulatory Visit | Attending: Cardiology | Admitting: Cardiology

## 2023-04-22 ENCOUNTER — Telehealth (HOSPITAL_COMMUNITY): Payer: Self-pay | Admitting: Emergency Medicine

## 2023-04-22 DIAGNOSIS — R072 Precordial pain: Secondary | ICD-10-CM | POA: Diagnosis not present

## 2023-04-22 MED ORDER — METOPROLOL TARTRATE 5 MG/5ML IV SOLN
10.0000 mg | Freq: Once | INTRAVENOUS | Status: AC | PRN
  Administered 2023-04-22: 10 mg via INTRAVENOUS

## 2023-04-22 MED ORDER — DILTIAZEM HCL 25 MG/5ML IV SOLN
10.0000 mg | INTRAVENOUS | Status: DC | PRN
  Administered 2023-04-22: 10 mg via INTRAVENOUS

## 2023-04-22 MED ORDER — SODIUM CHLORIDE 0.9 % IV SOLN: INTRAVENOUS | Status: DC

## 2023-04-22 MED ORDER — IOHEXOL 350 MG/ML SOLN
75.0000 mL | Freq: Once | INTRAVENOUS | Status: AC | PRN
  Administered 2023-04-22: 75 mL via INTRAVENOUS

## 2023-04-22 MED ORDER — IVABRADINE HCL 5 MG PO TABS
15.0000 mg | ORAL_TABLET | Freq: Once | ORAL | 0 refills | Status: AC
Start: 2023-04-22 — End: 2023-04-22

## 2023-04-22 MED ORDER — METOPROLOL TARTRATE 100 MG PO TABS: 100.0000 mg | ORAL_TABLET | Freq: Once | ORAL | 0 refills | Status: DC

## 2023-04-22 MED ORDER — NITROGLYCERIN 0.4 MG SL SUBL: 0.8000 mg | SUBLINGUAL_TABLET | Freq: Once | SUBLINGUAL | Status: DC

## 2023-04-22 NOTE — Telephone Encounter (Signed)
100mg  metoprolol + 15mg  ivabradine sent to pharmacy

## 2023-04-22 NOTE — Progress Notes (Signed)
Unable to get patients HR lower than 70 with medication (see MAR) prior to nitroglycerin SL. Dr. Myriam Forehand notified. Study cancelled and patient rescheduled to [facility] and premedication added.  Patient aware of reasoning and agreeable. All questions answered.

## 2023-04-23 ENCOUNTER — Telehealth (HOSPITAL_COMMUNITY): Payer: Self-pay | Admitting: *Deleted

## 2023-04-23 ENCOUNTER — Telehealth (HOSPITAL_COMMUNITY): Payer: Self-pay | Admitting: Emergency Medicine

## 2023-04-23 NOTE — Telephone Encounter (Signed)
Attempted to call patient regarding upcoming cardiac CT appointment. Left message on voicemail with name and callback number Hayley Sharpe RN Navigator Cardiac Imaging [facility] [phone] Office   

## 2023-04-23 NOTE — Telephone Encounter (Signed)
Reaching out to patient to offer assistance regarding upcoming cardiac imaging study; pt verbalizes understanding of appt date/time, parking situation and where to check in, pre-test NPO status and medications ordered, and verified current allergies; name and call back number provided for further questions should they arise Cayne Yom RN Navigator Cardiac Imaging [facility] [phone] office [phone] cell 

## 2023-04-26 ENCOUNTER — Ambulatory Visit
Admission: RE | Admit: 2023-04-26 | Discharge: 2023-04-26 | Disposition: A | Discharge status: Home / Self Care | Payer: 59 | Source: Ambulatory Visit | Attending: Cardiology | Admitting: Cardiology

## 2023-04-26 DIAGNOSIS — R072 Precordial pain: Secondary | ICD-10-CM | POA: Insufficient documentation

## 2023-04-26 MED ORDER — METOPROLOL TARTRATE 5 MG/5ML IV SOLN
10.0000 mg | Freq: Once | INTRAVENOUS | Status: DC | PRN
  Filled 2023-04-26: qty 10

## 2023-04-26 MED ORDER — DILTIAZEM HCL 25 MG/5ML IV SOLN
10.0000 mg | INTRAVENOUS | Status: DC | PRN
  Filled 2023-04-26: qty 5

## 2023-04-26 MED ORDER — NITROGLYCERIN 0.4 MG SL SUBL
0.8000 mg | SUBLINGUAL_TABLET | Freq: Once | SUBLINGUAL | Status: AC
Start: 2023-04-26 — End: 2023-04-26
  Administered 2023-04-26: 0.8 mg via SUBLINGUAL
  Filled 2023-04-26: qty 25

## 2023-04-26 MED ORDER — IOHEXOL 350 MG/ML SOLN
80.0000 mL | Freq: Once | INTRAVENOUS | Status: AC | PRN
  Administered 2023-04-26: 80 mL via INTRAVENOUS

## 2023-04-26 NOTE — Progress Notes (Addendum)
Patient tolerated procedure well. Ambulate w/o difficulty. Denies any lightheadedness or being dizzy. Pt denies any pain at this time. Sitting in chair. Pt is encouraged to drink additional water throughout the day and reason explained to patient. Patient verbalized understanding and all questions answered. ABC intact. No further needs at this time. Discharge from procedure area w/o issues. 

## 2023-04-27 ENCOUNTER — Other Ambulatory Visit: Payer: Self-pay | Admitting: Dermatology

## 2023-04-27 DIAGNOSIS — L409 Psoriasis, unspecified: Secondary | ICD-10-CM

## 2023-04-29 ENCOUNTER — Telehealth: Payer: Self-pay | Admitting: Internal Medicine

## 2023-04-29 DIAGNOSIS — G4733 Obstructive sleep apnea (adult) (pediatric): Secondary | ICD-10-CM

## 2023-04-29 NOTE — Telephone Encounter (Signed)
Her Overnight study on CPAP was done Dec 16 by Bonnell Public and noted tha tshe was still having  a high level of sleep disturbances:  14 sleep disturbances /hr  Please confirm with patient that she was wearing the CPAP for the study ,because this indicates a need for a change in equipment.

## 2023-04-29 NOTE — Assessment & Plan Note (Signed)
Overnight study on CPAP was done Dec 16 by Bonnell Public and noted  a high level of sleep disturbances:  14 sleep disturbances /hr without desaturations > 88%  Please confirm with patient that she was wearing the CPAP for the study ,because this indicates a need for a change in equipment.

## 2023-04-29 NOTE — Telephone Encounter (Signed)
Spoke with pt and informed her of her sleep study results. Pt stated that she was wearing her CPAP machine during the study.

## 2023-05-08 DIAGNOSIS — G4733 Obstructive sleep apnea (adult) (pediatric): Secondary | ICD-10-CM | POA: Diagnosis not present

## 2023-05-10 NOTE — Telephone Encounter (Signed)
 I have printed DME order to be faxed to [employer]. Placed in quick sign folder for signature.

## 2023-05-13 ENCOUNTER — Telehealth: Payer: Self-pay

## 2023-05-13 NOTE — Telephone Encounter (Signed)
 faxed

## 2023-05-13 NOTE — Telephone Encounter (Signed)
 error

## 2023-05-21 ENCOUNTER — Other Ambulatory Visit: Payer: Self-pay

## 2023-05-21 MED ORDER — TRIAMTERENE-HCTZ 37.5-25 MG PO CAPS: 1.0000 | ORAL_CAPSULE | Freq: Every morning | ORAL | 0 refills | Status: DC

## 2023-06-01 ENCOUNTER — Ambulatory Visit: Payer: 59 | Attending: Cardiology | Admitting: Cardiology

## 2023-06-01 ENCOUNTER — Encounter: Payer: Self-pay | Admitting: Cardiology

## 2023-06-01 VITALS — BP 152/80 | HR 77 | Ht 63.0 in | Wt 175.4 lb

## 2023-06-01 DIAGNOSIS — I10 Essential (primary) hypertension: Secondary | ICD-10-CM | POA: Diagnosis not present

## 2023-06-01 DIAGNOSIS — R002 Palpitations: Secondary | ICD-10-CM | POA: Diagnosis not present

## 2023-06-01 DIAGNOSIS — R0602 Shortness of breath: Secondary | ICD-10-CM | POA: Diagnosis not present

## 2023-06-01 DIAGNOSIS — E782 Mixed hyperlipidemia: Secondary | ICD-10-CM | POA: Diagnosis not present

## 2023-06-01 DIAGNOSIS — R0789 Other chest pain: Secondary | ICD-10-CM

## 2023-06-01 NOTE — Patient Instructions (Signed)
Medication Instructions:   Your physician recommends that you continue on your current medications as directed. Please refer to the Current Medication list given to you today.  *If you need a refill on your cardiac medications before your next appointment, please call your pharmacy*   Lab Work:  NONE TODAY  If you have labs (blood work) drawn today and your tests are completely normal, you will receive your results only by: MyChart Message (if you have MyChart) OR A paper copy in the mail If you have any lab test that is abnormal or we need to change your treatment, we will call you to review the results.   Testing/Procedures:  NONE TODAY   Follow-Up: At [facility], you and your health needs are our priority.  As part of our continuing mission to provide you with exceptional heart care, we have created designated Provider Care Teams.  These Care Teams include your primary Cardiologist (physician) and Advanced Practice Providers (APPs -  Physician Assistants and Nurse Practitioners) who all work together to provide you with the care you need, when you need it.  We recommend signing up for the patient portal called "MyChart".  Sign up information is provided on this After Visit Summary.  MyChart is used to connect with patients for Virtual Visits (Telemedicine).  Patients are able to view lab/test results, encounter notes, upcoming appointments, etc.  Non-urgent messages can be sent to your provider as well.   To learn more about what you can do with MyChart, go to [email or web address].    Your next appointment:   6 month(s)  Provider:   Debbe Odea, MD

## 2023-06-01 NOTE — Progress Notes (Signed)
Cardiology Office Note:  .   Date:  06/01/2023  ID:  Vanessa Richards, DOB 06-Nov-1960, MRN [id number] PCP: Sherlene Shams, MD   [facility] Providers Cardiologist:  Debbe Odea, MD    History of Present Illness: .   Vanessa Richards is a 63 y.o. female with past medical history of hypertension, hyperlipidemia, palpitations and chest tightness, paroxysmal SVT, who is here today for follow-up.  She was last seen in clinic 04/23/2023 by Dr. Azucena Cecil is a new patient was referred for cardiac evaluation for hyperlipidemia.  She had had complaints of chest tightness ongoing over several years but endorses shortness of breath with worsening left-sided chest tightness on exertion.  Stated father had heart attack in his 55s and her father died in his 71s.  She was very worried due to family history of CAD she is not sure if her symptoms are related to palpitations are attributable to stress or recently diagnosed sleep apnea.  Prior testing revealed a cardiac monitor 03/26/2023 with 3 occasional paroxysmal SVT episodes of PVCs and 2.2% with no significant or sustained arrhythmias and no A-fib.  Outside stress testing completed in 2013 revealed a normal LVEF 55% and no evidence for ischemia.  She was scheduled for coronary CTA and an echocardiogram.  She returns to clinic today stating that overall she has been doing well.  She was rushing to get to her appointment this morning as she had forgotten and she had to have her medications.  She states she continues to have some pain and discomfort down her left arm and left leg when she is in stressful situations.  She states that she has been taking her antianxiety medication he was previously prescribed something for sleep.  She states that every once a while she occasionally has swelling to her bilateral lower extremities and is unable to sit and bar stools due to swelling that is noted.  States that she has been compliant with her current  medication regimen without any unwanted side effects.  Denies any hospitalizations or visits to the emergency department.  ROS: 10 point review of systems has been reviewed and considered negative with exception what is listed in the HPI  Studies Reviewed: .       cCTA 04/26/2023 IMPRESSION: 1. Coronary calcium score of 0.   2. Normal coronary origin with right dominance.   3. No evidence of CAD.   4. CAD-RADS 0. Consider non-atherosclerotic causes of chest pain.  2D echo 04/15/2023 1. Left ventricular ejection fraction, by estimation, is 55 to 60%. The  left ventricle has normal function. The left ventricle has no regional  wall motion abnormalities. Left ventricular diastolic parameters were  normal. The average left ventricular  global longitudinal strain is -18.3 %.   2. Right ventricular systolic function is normal. The right ventricular  size is normal. Tricuspid regurgitation signal is inadequate for assessing  PA pressure.   3. The mitral valve is normal in structure. Mild to moderate mitral valve  regurgitation. No evidence of mitral stenosis.   4. The aortic valve is tricuspid. Aortic valve regurgitation is not  visualized. No aortic stenosis is present.   5. The inferior vena cava is normal in size with greater than 50%  respiratory variability, suggesting right atrial pressure of 3 mmHg.   Risk Assessment/Calculations:     HYPERTENSION CONTROL Vitals:   06/01/23 0901 06/01/23 0919  BP: (!) 160/92 (!) 152/80    The patient's blood pressure is elevated above target  today.  In order to address the patient's elevated BP: Blood pressure will be monitored at home to determine if medication changes need to be made. (pt has had no medication this morning)          Physical Exam:   VS:  BP (!) 152/80 (BP Location: Left Arm, Patient Position: Sitting, Cuff Size: Normal)   Pulse 77   Ht 5\' 3"  (1.6 m)   Wt 175 lb 6.4 oz (79.6 kg)   SpO2 99%   BMI 31.07 kg/m    Wt  Readings from Last 3 Encounters:  06/01/23 175 lb 6.4 oz (79.6 kg)  04/06/23 171 lb 12.8 oz (77.9 kg)  03/03/23 175 lb 9.6 oz (79.7 kg)    GEN: Well nourished, well developed in no acute distress NECK: No JVD; No carotid bruits CARDIAC: RRR, no murmurs, rubs, gallops RESPIRATORY:  Clear to auscultation without rales, wheezing or rhonchi  ABDOMEN: Soft, non-tender, non-distended EXTREMITIES:  No edema; No deformity   ASSESSMENT AND PLAN: .   Atypical chest pain with associated shortness of breath with exertion with a recent echocardiogram revealed LVEF 55 to 60%, no RWMA, and mild to moderate mitral regurg.  Coronary CTA was completed which had a coronary calcium score of 0.  She is continued on atorvastatin 20 mg daily.  She has been taking her antianxiety medication that has helped with symptoms.  Hyperlipidemia with her direct LDL of 110.  10-year ASCVD risk is 6.1 and she was started on atorvastatin 20 mg daily she will need repeat lipid and hepatic panel completed in 8 to 10 weeks.  Hypertension with a blood pressure of 160/92 with recheck of 152/80.  Patient has not had all of her medications yet today.  And stated that she was rushing to get to her appointment.  She has been continued on triamterene HCTZ 37.5/25 mg daily and Toprol-XL 100 mg daily.  She is encouraged to continue to monitor blood pressures 1 to 2 hours postmedication administration as well.  Previous review of blood pressures at other appointments reveals blood pressure has been stable and better controlled in the past.  Palpitations with previous cardiac monitor revealing paroxysmal SVT with no significant sustained arrhythmias.  She is continued on Toprol-XL 100 mg daily.       Dispo: Patient return to clinic to see MD/APP in 6 months or sooner if needed for reevaluation of symptoms.  Signed, Traven Davids, NP

## 2023-06-08 DIAGNOSIS — G4733 Obstructive sleep apnea (adult) (pediatric): Secondary | ICD-10-CM | POA: Diagnosis not present

## 2023-07-06 DIAGNOSIS — G4733 Obstructive sleep apnea (adult) (pediatric): Secondary | ICD-10-CM | POA: Diagnosis not present

## 2023-07-16 ENCOUNTER — Other Ambulatory Visit: Payer: Self-pay | Admitting: Internal Medicine

## 2023-07-16 DIAGNOSIS — I10 Essential (primary) hypertension: Secondary | ICD-10-CM

## 2023-07-30 ENCOUNTER — Other Ambulatory Visit: Payer: Self-pay | Admitting: Internal Medicine

## 2023-08-06 DIAGNOSIS — G4733 Obstructive sleep apnea (adult) (pediatric): Secondary | ICD-10-CM | POA: Diagnosis not present

## 2023-08-16 ENCOUNTER — Other Ambulatory Visit: Payer: Self-pay | Admitting: Podiatry

## 2023-08-17 ENCOUNTER — Ambulatory Visit
Admission: RE | Admit: 2023-08-17 | Discharge: 2023-08-17 | Disposition: A | Discharge status: Home / Self Care | Source: Ambulatory Visit | Attending: Internal Medicine | Admitting: Internal Medicine

## 2023-08-17 DIAGNOSIS — Z1231 Encounter for screening mammogram for malignant neoplasm of breast: Secondary | ICD-10-CM | POA: Diagnosis not present

## 2023-09-05 DIAGNOSIS — G4733 Obstructive sleep apnea (adult) (pediatric): Secondary | ICD-10-CM | POA: Diagnosis not present

## 2023-10-06 DIAGNOSIS — G4733 Obstructive sleep apnea (adult) (pediatric): Secondary | ICD-10-CM | POA: Diagnosis not present

## 2023-10-11 ENCOUNTER — Other Ambulatory Visit: Payer: Self-pay | Admitting: Internal Medicine

## 2023-10-11 DIAGNOSIS — I10 Essential (primary) hypertension: Secondary | ICD-10-CM

## 2023-10-11 MED ORDER — METOPROLOL SUCCINATE ER 100 MG PO TB24: ORAL_TABLET | ORAL | 1 refills | Status: DC

## 2023-10-11 MED ORDER — SUMATRIPTAN SUCCINATE 25 MG PO TABS: ORAL_TABLET | ORAL | 0 refills | Status: AC

## 2023-10-11 NOTE — Telephone Encounter (Signed)
 Copied from CRM [id number]. Topic: Clinical - Medication Refill >> Oct 11, 2023 10:40 AM Adonis Hoot wrote: Medication: SUMAtriptan  (IMITREX ) 25 MG tablet  metoprolol  succinate (TOPROL -XL) 100 MG 24 hr tablet  Has the patient contacted their pharmacy? No (Agent: If no, request that the patient contact the pharmacy for the refill. If patient does not wish to contact the pharmacy document the reason why and proceed with request.) (Agent: If yes, when and what did the pharmacy advise?)  This is the patient's preferred pharmacy:  [id number] [place], [place] - [address] AT [facility] [address] [place] [address] [place] [address] Phone: [phone] Fax: [phone]  Is this the correct pharmacy for this prescription? Yes If no, delete pharmacy and type the correct one.   Has the prescription been filled recently? No  Is the patient out of the medication? Yes  Has the patient been seen for an appointment in the last year OR does the patient have an upcoming appointment? Yes  Can we respond through MyChart? Yes  Agent: Please be advised that Rx refills may take up to 3 business days. We ask that you follow-up with your pharmacy.

## 2023-11-03 DIAGNOSIS — M9903 Segmental and somatic dysfunction of lumbar region: Secondary | ICD-10-CM | POA: Diagnosis not present

## 2023-11-03 DIAGNOSIS — M9902 Segmental and somatic dysfunction of thoracic region: Secondary | ICD-10-CM | POA: Diagnosis not present

## 2023-11-03 DIAGNOSIS — M9904 Segmental and somatic dysfunction of sacral region: Secondary | ICD-10-CM | POA: Diagnosis not present

## 2023-11-03 DIAGNOSIS — M9901 Segmental and somatic dysfunction of cervical region: Secondary | ICD-10-CM | POA: Diagnosis not present

## 2023-11-04 DIAGNOSIS — M9902 Segmental and somatic dysfunction of thoracic region: Secondary | ICD-10-CM | POA: Diagnosis not present

## 2023-11-04 DIAGNOSIS — M9903 Segmental and somatic dysfunction of lumbar region: Secondary | ICD-10-CM | POA: Diagnosis not present

## 2023-11-04 DIAGNOSIS — M9904 Segmental and somatic dysfunction of sacral region: Secondary | ICD-10-CM | POA: Diagnosis not present

## 2023-11-04 DIAGNOSIS — M9901 Segmental and somatic dysfunction of cervical region: Secondary | ICD-10-CM | POA: Diagnosis not present

## 2023-11-05 DIAGNOSIS — G4733 Obstructive sleep apnea (adult) (pediatric): Secondary | ICD-10-CM | POA: Diagnosis not present

## 2023-11-09 DIAGNOSIS — M9904 Segmental and somatic dysfunction of sacral region: Secondary | ICD-10-CM | POA: Diagnosis not present

## 2023-11-09 DIAGNOSIS — M9901 Segmental and somatic dysfunction of cervical region: Secondary | ICD-10-CM | POA: Diagnosis not present

## 2023-11-09 DIAGNOSIS — M9902 Segmental and somatic dysfunction of thoracic region: Secondary | ICD-10-CM | POA: Diagnosis not present

## 2023-11-09 DIAGNOSIS — M9903 Segmental and somatic dysfunction of lumbar region: Secondary | ICD-10-CM | POA: Diagnosis not present

## 2023-11-11 DIAGNOSIS — M9903 Segmental and somatic dysfunction of lumbar region: Secondary | ICD-10-CM | POA: Diagnosis not present

## 2023-11-11 DIAGNOSIS — M9904 Segmental and somatic dysfunction of sacral region: Secondary | ICD-10-CM | POA: Diagnosis not present

## 2023-11-11 DIAGNOSIS — M9901 Segmental and somatic dysfunction of cervical region: Secondary | ICD-10-CM | POA: Diagnosis not present

## 2023-11-11 DIAGNOSIS — M9902 Segmental and somatic dysfunction of thoracic region: Secondary | ICD-10-CM | POA: Diagnosis not present

## 2023-11-16 ENCOUNTER — Other Ambulatory Visit: Payer: Self-pay

## 2023-11-16 MED ORDER — ESCITALOPRAM OXALATE 10 MG PO TABS: 10.0000 mg | ORAL_TABLET | Freq: Every day | ORAL | 1 refills | Status: DC

## 2023-11-17 DIAGNOSIS — M9901 Segmental and somatic dysfunction of cervical region: Secondary | ICD-10-CM | POA: Diagnosis not present

## 2023-11-17 DIAGNOSIS — M9904 Segmental and somatic dysfunction of sacral region: Secondary | ICD-10-CM | POA: Diagnosis not present

## 2023-11-17 DIAGNOSIS — M9902 Segmental and somatic dysfunction of thoracic region: Secondary | ICD-10-CM | POA: Diagnosis not present

## 2023-11-17 DIAGNOSIS — M9903 Segmental and somatic dysfunction of lumbar region: Secondary | ICD-10-CM | POA: Diagnosis not present

## 2023-11-18 DIAGNOSIS — M9901 Segmental and somatic dysfunction of cervical region: Secondary | ICD-10-CM | POA: Diagnosis not present

## 2023-11-18 DIAGNOSIS — M9903 Segmental and somatic dysfunction of lumbar region: Secondary | ICD-10-CM | POA: Diagnosis not present

## 2023-11-18 DIAGNOSIS — M9904 Segmental and somatic dysfunction of sacral region: Secondary | ICD-10-CM | POA: Diagnosis not present

## 2023-11-18 DIAGNOSIS — M9902 Segmental and somatic dysfunction of thoracic region: Secondary | ICD-10-CM | POA: Diagnosis not present

## 2023-11-19 DIAGNOSIS — M9903 Segmental and somatic dysfunction of lumbar region: Secondary | ICD-10-CM | POA: Diagnosis not present

## 2023-11-19 DIAGNOSIS — M9901 Segmental and somatic dysfunction of cervical region: Secondary | ICD-10-CM | POA: Diagnosis not present

## 2023-11-19 DIAGNOSIS — M9904 Segmental and somatic dysfunction of sacral region: Secondary | ICD-10-CM | POA: Diagnosis not present

## 2023-11-19 DIAGNOSIS — M9902 Segmental and somatic dysfunction of thoracic region: Secondary | ICD-10-CM | POA: Diagnosis not present

## 2023-11-23 DIAGNOSIS — M9904 Segmental and somatic dysfunction of sacral region: Secondary | ICD-10-CM | POA: Diagnosis not present

## 2023-11-23 DIAGNOSIS — M9901 Segmental and somatic dysfunction of cervical region: Secondary | ICD-10-CM | POA: Diagnosis not present

## 2023-11-23 DIAGNOSIS — M9902 Segmental and somatic dysfunction of thoracic region: Secondary | ICD-10-CM | POA: Diagnosis not present

## 2023-11-23 DIAGNOSIS — M9903 Segmental and somatic dysfunction of lumbar region: Secondary | ICD-10-CM | POA: Diagnosis not present

## 2023-11-24 DIAGNOSIS — M9901 Segmental and somatic dysfunction of cervical region: Secondary | ICD-10-CM | POA: Diagnosis not present

## 2023-11-24 DIAGNOSIS — M9904 Segmental and somatic dysfunction of sacral region: Secondary | ICD-10-CM | POA: Diagnosis not present

## 2023-11-24 DIAGNOSIS — M9902 Segmental and somatic dysfunction of thoracic region: Secondary | ICD-10-CM | POA: Diagnosis not present

## 2023-11-24 DIAGNOSIS — M9903 Segmental and somatic dysfunction of lumbar region: Secondary | ICD-10-CM | POA: Diagnosis not present

## 2023-11-25 ENCOUNTER — Other Ambulatory Visit: Payer: Self-pay | Admitting: Internal Medicine

## 2023-11-25 DIAGNOSIS — M9903 Segmental and somatic dysfunction of lumbar region: Secondary | ICD-10-CM | POA: Diagnosis not present

## 2023-11-25 DIAGNOSIS — M9902 Segmental and somatic dysfunction of thoracic region: Secondary | ICD-10-CM | POA: Diagnosis not present

## 2023-11-25 DIAGNOSIS — M9904 Segmental and somatic dysfunction of sacral region: Secondary | ICD-10-CM | POA: Diagnosis not present

## 2023-11-25 DIAGNOSIS — M9901 Segmental and somatic dysfunction of cervical region: Secondary | ICD-10-CM | POA: Diagnosis not present

## 2023-11-30 DIAGNOSIS — M9904 Segmental and somatic dysfunction of sacral region: Secondary | ICD-10-CM | POA: Diagnosis not present

## 2023-11-30 DIAGNOSIS — M9901 Segmental and somatic dysfunction of cervical region: Secondary | ICD-10-CM | POA: Diagnosis not present

## 2023-11-30 DIAGNOSIS — M9903 Segmental and somatic dysfunction of lumbar region: Secondary | ICD-10-CM | POA: Diagnosis not present

## 2023-11-30 DIAGNOSIS — M9902 Segmental and somatic dysfunction of thoracic region: Secondary | ICD-10-CM | POA: Diagnosis not present

## 2023-12-01 DIAGNOSIS — M9903 Segmental and somatic dysfunction of lumbar region: Secondary | ICD-10-CM | POA: Diagnosis not present

## 2023-12-01 DIAGNOSIS — M9901 Segmental and somatic dysfunction of cervical region: Secondary | ICD-10-CM | POA: Diagnosis not present

## 2023-12-01 DIAGNOSIS — M9904 Segmental and somatic dysfunction of sacral region: Secondary | ICD-10-CM | POA: Diagnosis not present

## 2023-12-01 DIAGNOSIS — M9902 Segmental and somatic dysfunction of thoracic region: Secondary | ICD-10-CM | POA: Diagnosis not present

## 2023-12-02 DIAGNOSIS — M9903 Segmental and somatic dysfunction of lumbar region: Secondary | ICD-10-CM | POA: Diagnosis not present

## 2023-12-02 DIAGNOSIS — M9901 Segmental and somatic dysfunction of cervical region: Secondary | ICD-10-CM | POA: Diagnosis not present

## 2023-12-02 DIAGNOSIS — M9902 Segmental and somatic dysfunction of thoracic region: Secondary | ICD-10-CM | POA: Diagnosis not present

## 2023-12-02 DIAGNOSIS — M9904 Segmental and somatic dysfunction of sacral region: Secondary | ICD-10-CM | POA: Diagnosis not present

## 2023-12-06 DIAGNOSIS — G4733 Obstructive sleep apnea (adult) (pediatric): Secondary | ICD-10-CM | POA: Diagnosis not present

## 2023-12-07 DIAGNOSIS — M9903 Segmental and somatic dysfunction of lumbar region: Secondary | ICD-10-CM | POA: Diagnosis not present

## 2023-12-07 DIAGNOSIS — M9901 Segmental and somatic dysfunction of cervical region: Secondary | ICD-10-CM | POA: Diagnosis not present

## 2023-12-07 DIAGNOSIS — M9902 Segmental and somatic dysfunction of thoracic region: Secondary | ICD-10-CM | POA: Diagnosis not present

## 2023-12-07 DIAGNOSIS — M9904 Segmental and somatic dysfunction of sacral region: Secondary | ICD-10-CM | POA: Diagnosis not present

## 2023-12-08 ENCOUNTER — Ambulatory Visit: Admitting: Internal Medicine

## 2023-12-08 ENCOUNTER — Encounter: Payer: Self-pay | Admitting: Internal Medicine

## 2023-12-08 VITALS — BP 130/82 | HR 76 | Temp 98.4°F | Ht 63.0 in | Wt 178.1 lb

## 2023-12-08 DIAGNOSIS — Z0001 Encounter for general adult medical examination with abnormal findings: Secondary | ICD-10-CM | POA: Diagnosis not present

## 2023-12-08 DIAGNOSIS — E049 Nontoxic goiter, unspecified: Secondary | ICD-10-CM | POA: Diagnosis not present

## 2023-12-08 DIAGNOSIS — E781 Pure hyperglyceridemia: Secondary | ICD-10-CM | POA: Diagnosis not present

## 2023-12-08 DIAGNOSIS — Z23 Encounter for immunization: Secondary | ICD-10-CM

## 2023-12-08 DIAGNOSIS — Z0000 Encounter for general adult medical examination without abnormal findings: Secondary | ICD-10-CM

## 2023-12-08 DIAGNOSIS — R002 Palpitations: Secondary | ICD-10-CM | POA: Diagnosis not present

## 2023-12-08 DIAGNOSIS — R7303 Prediabetes: Secondary | ICD-10-CM | POA: Diagnosis not present

## 2023-12-08 DIAGNOSIS — I10 Essential (primary) hypertension: Secondary | ICD-10-CM | POA: Diagnosis not present

## 2023-12-08 DIAGNOSIS — G4733 Obstructive sleep apnea (adult) (pediatric): Secondary | ICD-10-CM

## 2023-12-08 DIAGNOSIS — R0683 Snoring: Secondary | ICD-10-CM

## 2023-12-08 DIAGNOSIS — R5383 Other fatigue: Secondary | ICD-10-CM | POA: Diagnosis not present

## 2023-12-08 MED ORDER — OMEPRAZOLE 20 MG PO CPDR: DELAYED_RELEASE_CAPSULE | ORAL | 1 refills | Status: DC

## 2023-12-08 MED ORDER — TRIAMTERENE-HCTZ 37.5-25 MG PO CAPS: 1.0000 | ORAL_CAPSULE | Freq: Every morning | ORAL | 0 refills | Status: DC

## 2023-12-08 NOTE — Progress Notes (Unsigned)
 Patient ID: Vanessa Richards, female    DOB: 09/11/1960  Age: 63 y.o. MRN: [id number]  The patient is here for annual preventive examination and management of other chronic and acute problems.   The risk factors are reflected in the social history.   The roster of all physicians providing medical care to patient - is listed in the Snapshot section of the chart.   Activities of daily living:  The patient is 100% independent in all ADLs: dressing, toileting, feeding as well as independent mobility   Home safety : The patient has smoke detectors in the home. They wear seatbelts.  There are no unsecured firearms at home. There is no violence in the home.    There is no risks for hepatitis, STDs or HIV. There is no   history of blood transfusion. They have no travel history to infectious disease endemic areas of the world.   The patient has seen their dentist in the last six month. They have seen their eye doctor in the last year. The patinet  denies slight hearing difficulty with regard to whispered voices and some television programs.  They have deferred audiologic testing in the last year.  They do not  have excessive sun exposure. Discussed the need for sun protection: hats, long sleeves and use of sunscreen if there is significant sun exposure.    Diet: the importance of a healthy diet is discussed. They do have a healthy diet.   The benefits of regular aerobic exercise were discussed. The patient  exercises  3 to 5 days per week  for  60 minutes.    Depression screen: there are no signs or vegative symptoms of depression- irritability, change in appetite, anhedonia, sadness/tearfullness.   The following portions of the patient's history were reviewed and updated as appropriate: allergies, current medications, past family history, past medical history,  past surgical history, past social history  and problem list.   Visual acuity was not assessed per patient preference since the patient has  regular follow up with an  ophthalmologist. Hearing and body mass index were assessed and reviewed.    During the course of the visit the patient was educated and counseled about appropriate screening and preventive services including : fall prevention , diabetes screening, nutrition counseling, colorectal cancer screening, and recommended immunizations.    Chief Complaint:  Brother diagnosed with MSA at age 74 after going to the [facility] clinic with vertigo of unclear origin.   SVT during coronary calcium  CT  (score of zero)  GAD: has episodes of shortness of breath panic , triggered by health concerns . Using alprazolam  prn anxiety  brought on by heat rash   Review of Symptoms  Patient denies headache, fevers, malaise, unintentional weight loss, skin rash, eye pain, sinus congestion and sinus pain, sore throat, dysphagia,  hemoptysis , cough, dyspnea, wheezing, chest pain, palpitations, orthopnea, edema, abdominal pain, nausea, melena, diarrhea, constipation, flank pain, dysuria, hematuria, urinary  Frequency, nocturia, numbness, tingling, seizures,  Focal weakness, Loss of consciousness,  Tremor, insomnia, depression, anxiety, and suicidal ideation.    Physical Exam:  BP 130/82   Pulse 76   Temp 98.4 F (36.9 C) (Oral)   Ht 5' 3 (1.6 m)   Wt 178 lb 1.9 oz (80.8 kg)   SpO2 98%   BMI 31.55 kg/m    Physical Exam Vitals reviewed.  Constitutional:      General: She is not in acute distress.    Appearance: Normal appearance. She is  well-developed and normal weight. She is not ill-appearing, toxic-appearing or diaphoretic.  HENT:     Head: Normocephalic.     Right Ear: Tympanic membrane, ear canal and external ear normal. There is no impacted cerumen.     Left Ear: Tympanic membrane, ear canal and external ear normal. There is no impacted cerumen.     Nose: Nose normal.     Mouth/Throat:     Mouth: Mucous membranes are moist.     Pharynx: Oropharynx is clear.  Eyes:     General: No  scleral icterus.       Right eye: No discharge.        Left eye: No discharge.     Conjunctiva/sclera: Conjunctivae normal.     Pupils: Pupils are equal, round, and reactive to light.  Neck:     Thyroid : No thyromegaly.     Vascular: No carotid bruit or JVD.  Cardiovascular:     Rate and Rhythm: Normal rate and regular rhythm.     Heart sounds: Normal heart sounds.  Pulmonary:     Effort: Pulmonary effort is normal. No respiratory distress.     Breath sounds: Normal breath sounds.  Chest:  Breasts:    Breasts are symmetrical.     Right: Normal. No swelling, inverted nipple, mass, nipple discharge, skin change or tenderness.     Left: Normal. No swelling, inverted nipple, mass, nipple discharge, skin change or tenderness.  Abdominal:     General: Bowel sounds are normal.     Palpations: Abdomen is soft. There is no mass.     Tenderness: There is no abdominal tenderness. There is no guarding or rebound.  Musculoskeletal:        General: Normal range of motion.     Cervical back: Normal range of motion and neck supple.  Lymphadenopathy:     Cervical: No cervical adenopathy.     Upper Body:     Right upper body: No supraclavicular, axillary or pectoral adenopathy.     Left upper body: No supraclavicular, axillary or pectoral adenopathy.  Skin:    General: Skin is warm and dry.  Neurological:     General: No focal deficit present.     Mental Status: She is alert and oriented to person, place, and time. Mental status is at baseline.  Psychiatric:        Mood and Affect: Mood normal.        Behavior: Behavior normal.        Thought Content: Thought content normal.        Judgment: Judgment normal.    Assessment and Plan: Primary hypertension  Thyroid  enlargement  Hypertriglyceridemia  Prediabetes  Encounter for preventative adult health care examination  Other fatigue    No follow-ups on file.  Verneita LITTIE Kettering, MD

## 2023-12-08 NOTE — Assessment & Plan Note (Signed)
Diagnosed by sleep study. She is wearing her CPAP every night a minimum of 6 hours per night and notes improved daytime wakefulness and decreased fatigue  

## 2023-12-08 NOTE — Assessment & Plan Note (Signed)
 With obesity, hypertension, palpitations,  and fatigue.  OSA suspected,  confirmed by Sleep study

## 2023-12-08 NOTE — Assessment & Plan Note (Signed)
 Ultrasound was deferred by patient.

## 2023-12-09 LAB — CBC WITH DIFFERENTIAL/PLATELET
Basophils Absolute: 0.2 K/uL — ABNORMAL HIGH (ref 0.0–0.1)
Basophils Relative: 1.9 % (ref 0.0–3.0)
Eosinophils Absolute: 0.3 K/uL (ref 0.0–0.7)
Eosinophils Relative: 2.9 % (ref 0.0–5.0)
HCT: 39.2 % (ref 36.0–46.0)
Hemoglobin: 13.2 g/dL (ref 12.0–15.0)
Lymphocytes Relative: 29.9 % (ref 12.0–46.0)
Lymphs Abs: 2.9 K/uL (ref 0.7–4.0)
MCHC: 33.6 g/dL (ref 30.0–36.0)
MCV: 89.7 fl (ref 78.0–100.0)
Monocytes Absolute: 0.6 K/uL (ref 0.1–1.0)
Monocytes Relative: 5.9 % (ref 3.0–12.0)
Neutro Abs: 5.7 K/uL (ref 1.4–7.7)
Neutrophils Relative %: 59.4 % (ref 43.0–77.0)
Platelets: 333 K/uL (ref 150.0–400.0)
RBC: 4.37 Mil/uL (ref 3.87–5.11)
RDW: 13.1 % (ref 11.5–15.5)
WBC: 9.6 K/uL (ref 4.0–10.5)

## 2023-12-09 LAB — COMPREHENSIVE METABOLIC PANEL WITH GFR
ALT: 14 U/L (ref 0–35)
AST: 16 U/L (ref 0–37)
Albumin: 4.3 g/dL (ref 3.5–5.2)
Alkaline Phosphatase: 90 U/L (ref 39–117)
BUN: 15 mg/dL (ref 6–23)
CO2: 26 meq/L (ref 19–32)
Calcium: 9.1 mg/dL (ref 8.4–10.5)
Chloride: 100 meq/L (ref 96–112)
Creatinine, Ser: 0.73 mg/dL (ref 0.40–1.20)
GFR: 87.91 mL/min (ref >60.00)
Glucose, Bld: 98 mg/dL (ref 70–99)
Potassium: 3.5 meq/L (ref 3.5–5.1)
Sodium: 141 meq/L (ref 135–145)
Total Bilirubin: 0.2 mg/dL (ref 0.2–1.2)
Total Protein: 7.4 g/dL (ref 6.0–8.3)

## 2023-12-09 LAB — HEMOGLOBIN A1C: Hgb A1c MFr Bld: 5.9 % (ref 4.6–6.5)

## 2023-12-09 LAB — LIPID PANEL
Cholesterol: 154 mg/dL (ref 0–200)
HDL: 44.2 mg/dL (ref >39.00)
NonHDL: 109.39
Total CHOL/HDL Ratio: 3
Triglycerides: 456 mg/dL — ABNORMAL HIGH (ref 0.0–149.0)
VLDL: 91.2 mg/dL — ABNORMAL HIGH (ref 0.0–40.0)

## 2023-12-09 LAB — MICROALBUMIN / CREATININE URINE RATIO
Creatinine,U: 108.1 mg/dL
Microalb Creat Ratio: 6.6 mg/g (ref 0.0–30.0)
Microalb, Ur: 0.7 mg/dL (ref 0.0–1.9)

## 2023-12-09 LAB — LDL CHOLESTEROL, DIRECT: Direct LDL: 67 mg/dL

## 2023-12-09 LAB — TSH: TSH: 1.68 u[IU]/mL (ref 0.35–5.50)

## 2023-12-10 ENCOUNTER — Ambulatory Visit: Payer: Self-pay | Admitting: Internal Medicine

## 2023-12-10 DIAGNOSIS — E781 Pure hyperglyceridemia: Secondary | ICD-10-CM

## 2023-12-10 NOTE — Assessment & Plan Note (Signed)
 She did not tolerate a trial of tricor  . lipids did improve after a 3 month trial of diet and exercise, but today's lipids were nonfasting   Lab Results  Component Value Date   CHOL 154 12/08/2023   HDL 44.20 12/08/2023   LDLCALC 119 (H) 02/17/2022   LDLDIRECT 67.0 12/08/2023   TRIG (H) 12/08/2023    456.0 Triglyceride is over 400; calculations on Lipids are invalid.   CHOLHDL 3 12/08/2023

## 2023-12-10 NOTE — Assessment & Plan Note (Signed)

## 2023-12-10 NOTE — Assessment & Plan Note (Addendum)
 SVT.  Occurring sporadically,  unprovoked,  with accompanying dyspnea .  She has been evaluated with  a ZIO monitor for 2 week wear and has seen cardiology  with noninvasive imaging done including Coronary Calcium  CT (score was 0)  .  Continue metoprolol  100 mg daily

## 2023-12-10 NOTE — Assessment & Plan Note (Signed)
 She did not tolerate a trial of tricor  . lipids did improve after a 3 month trial of diet and exercise, but today's lipids were nonfasting .  Recheck in 6 months in a fasting state   Lab Results  Component Value Date   CHOL 154 12/08/2023   HDL 44.20 12/08/2023   LDLCALC 119 (H) 02/17/2022   LDLDIRECT 67.0 12/08/2023   TRIG (H) 12/08/2023    456.0 Triglyceride is over 400; calculations on Lipids are invalid.   CHOLHDL 3 12/08/2023

## 2023-12-14 DIAGNOSIS — M9902 Segmental and somatic dysfunction of thoracic region: Secondary | ICD-10-CM | POA: Diagnosis not present

## 2023-12-14 DIAGNOSIS — M9903 Segmental and somatic dysfunction of lumbar region: Secondary | ICD-10-CM | POA: Diagnosis not present

## 2023-12-14 DIAGNOSIS — M9904 Segmental and somatic dysfunction of sacral region: Secondary | ICD-10-CM | POA: Diagnosis not present

## 2023-12-14 DIAGNOSIS — M9901 Segmental and somatic dysfunction of cervical region: Secondary | ICD-10-CM | POA: Diagnosis not present

## 2023-12-15 DIAGNOSIS — M9904 Segmental and somatic dysfunction of sacral region: Secondary | ICD-10-CM | POA: Diagnosis not present

## 2023-12-15 DIAGNOSIS — M9901 Segmental and somatic dysfunction of cervical region: Secondary | ICD-10-CM | POA: Diagnosis not present

## 2023-12-15 DIAGNOSIS — M9902 Segmental and somatic dysfunction of thoracic region: Secondary | ICD-10-CM | POA: Diagnosis not present

## 2023-12-15 DIAGNOSIS — M9903 Segmental and somatic dysfunction of lumbar region: Secondary | ICD-10-CM | POA: Diagnosis not present

## 2023-12-16 DIAGNOSIS — M9901 Segmental and somatic dysfunction of cervical region: Secondary | ICD-10-CM | POA: Diagnosis not present

## 2023-12-16 DIAGNOSIS — M9904 Segmental and somatic dysfunction of sacral region: Secondary | ICD-10-CM | POA: Diagnosis not present

## 2023-12-16 DIAGNOSIS — M9902 Segmental and somatic dysfunction of thoracic region: Secondary | ICD-10-CM | POA: Diagnosis not present

## 2023-12-16 DIAGNOSIS — M9903 Segmental and somatic dysfunction of lumbar region: Secondary | ICD-10-CM | POA: Diagnosis not present

## 2023-12-21 DIAGNOSIS — M9901 Segmental and somatic dysfunction of cervical region: Secondary | ICD-10-CM | POA: Diagnosis not present

## 2023-12-21 DIAGNOSIS — M9904 Segmental and somatic dysfunction of sacral region: Secondary | ICD-10-CM | POA: Diagnosis not present

## 2023-12-21 DIAGNOSIS — M9902 Segmental and somatic dysfunction of thoracic region: Secondary | ICD-10-CM | POA: Diagnosis not present

## 2023-12-21 DIAGNOSIS — M9903 Segmental and somatic dysfunction of lumbar region: Secondary | ICD-10-CM | POA: Diagnosis not present

## 2023-12-23 DIAGNOSIS — M9903 Segmental and somatic dysfunction of lumbar region: Secondary | ICD-10-CM | POA: Diagnosis not present

## 2023-12-23 DIAGNOSIS — M9902 Segmental and somatic dysfunction of thoracic region: Secondary | ICD-10-CM | POA: Diagnosis not present

## 2023-12-23 DIAGNOSIS — M9904 Segmental and somatic dysfunction of sacral region: Secondary | ICD-10-CM | POA: Diagnosis not present

## 2023-12-23 DIAGNOSIS — M9901 Segmental and somatic dysfunction of cervical region: Secondary | ICD-10-CM | POA: Diagnosis not present

## 2023-12-27 ENCOUNTER — Telehealth: Payer: Self-pay

## 2023-12-27 MED ORDER — ALPRAZOLAM 0.5 MG PO TABS: ORAL_TABLET | ORAL | 5 refills | Status: AC

## 2023-12-27 MED ORDER — DOXYCYCLINE HYCLATE 100 MG PO TABS: 100.0000 mg | ORAL_TABLET | Freq: Two times a day (BID) | ORAL | 0 refills | Status: DC

## 2023-12-27 NOTE — Telephone Encounter (Signed)
 Copied from CRM #[id number]. Topic: General - Other >> Dec 27, 2023  4:17 PM [name] wrote: Reason for CRM: patient called stating the provider was going to call in a new medication for doxycyline. The patient want to know if she can refill her alprazolam  also CB [phone]

## 2023-12-27 NOTE — Telephone Encounter (Signed)
 Spoke with pt and she stated that at her last visit it was discussed that because she is outside a lot and gets tick bites that doxycycline  would be sent in but it never got sent in. Pt would like to have that sent in and also see about getting a refill of her alprazolam .

## 2023-12-27 NOTE — Addendum Note (Signed)
 Addended by: MARYLYNN VERNEITA CROME on: 12/27/2023 05:49 PM   Modules accepted: Orders

## 2023-12-28 DIAGNOSIS — M9903 Segmental and somatic dysfunction of lumbar region: Secondary | ICD-10-CM | POA: Diagnosis not present

## 2023-12-28 DIAGNOSIS — M9901 Segmental and somatic dysfunction of cervical region: Secondary | ICD-10-CM | POA: Diagnosis not present

## 2023-12-28 DIAGNOSIS — M9902 Segmental and somatic dysfunction of thoracic region: Secondary | ICD-10-CM | POA: Diagnosis not present

## 2023-12-28 DIAGNOSIS — M9904 Segmental and somatic dysfunction of sacral region: Secondary | ICD-10-CM | POA: Diagnosis not present

## 2023-12-30 DIAGNOSIS — M9902 Segmental and somatic dysfunction of thoracic region: Secondary | ICD-10-CM | POA: Diagnosis not present

## 2023-12-30 DIAGNOSIS — M9901 Segmental and somatic dysfunction of cervical region: Secondary | ICD-10-CM | POA: Diagnosis not present

## 2023-12-30 DIAGNOSIS — M9903 Segmental and somatic dysfunction of lumbar region: Secondary | ICD-10-CM | POA: Diagnosis not present

## 2023-12-30 DIAGNOSIS — M9904 Segmental and somatic dysfunction of sacral region: Secondary | ICD-10-CM | POA: Diagnosis not present

## 2024-01-06 DIAGNOSIS — G4733 Obstructive sleep apnea (adult) (pediatric): Secondary | ICD-10-CM | POA: Diagnosis not present

## 2024-01-16 ENCOUNTER — Other Ambulatory Visit: Payer: Self-pay | Admitting: Podiatry

## 2024-01-25 ENCOUNTER — Encounter: Payer: Self-pay | Admitting: Cardiology

## 2024-01-25 ENCOUNTER — Ambulatory Visit: Attending: Cardiology | Admitting: Cardiology

## 2024-01-25 VITALS — BP 150/98 | HR 81 | Ht 63.0 in | Wt 181.0 lb

## 2024-01-25 DIAGNOSIS — I471 Supraventricular tachycardia, unspecified: Secondary | ICD-10-CM | POA: Diagnosis not present

## 2024-01-25 DIAGNOSIS — R002 Palpitations: Secondary | ICD-10-CM | POA: Diagnosis not present

## 2024-01-25 DIAGNOSIS — I10 Essential (primary) hypertension: Secondary | ICD-10-CM

## 2024-01-25 DIAGNOSIS — E781 Pure hyperglyceridemia: Secondary | ICD-10-CM | POA: Diagnosis not present

## 2024-01-25 DIAGNOSIS — E782 Mixed hyperlipidemia: Secondary | ICD-10-CM

## 2024-01-25 MED ORDER — METOPROLOL SUCCINATE ER 100 MG PO TB24: ORAL_TABLET | ORAL | 3 refills | Status: AC

## 2024-01-25 MED ORDER — ATORVASTATIN CALCIUM 20 MG PO TABS: 20.0000 mg | ORAL_TABLET | Freq: Every day | ORAL | 3 refills | Status: DC

## 2024-01-25 MED ORDER — TRIAMTERENE-HCTZ 37.5-25 MG PO CAPS: 1.0000 | ORAL_CAPSULE | Freq: Every morning | ORAL | 3 refills | Status: AC

## 2024-01-25 NOTE — Patient Instructions (Signed)
 Medication Instructions:  Your physician recommends that you continue on your current medications as directed. Please refer to the Current Medication list given to you today.  *If you need a refill on your cardiac medications before your next appointment, please call your pharmacy*  Lab Work: No labs ordered today  If you have labs (blood work) drawn today and your tests are completely normal, you will receive your results only by: MyChart Message (if you have MyChart) OR A paper copy in the mail If you have any lab test that is abnormal or we need to change your treatment, we will call you to review the results.  Testing/Procedures: No test ordered today   Follow-Up: At [facility], you and your health needs are our priority.  As part of our continuing mission to provide you with exceptional heart care, our providers are all part of one team.  This team includes your primary Cardiologist (physician) and Advanced Practice Providers or APPs (Physician Assistants and Nurse Practitioners) who all work together to provide you with the care you need, when you need it.  Your next appointment:   3 month(s)  Provider:   You may see Constancia Delton, MD or one of the following Advanced Practice Providers on your designated Care Team:   Laneta Pintos, NP Gildardo Labrador, PA-C Varney Gentleman, PA-C Cadence Camanche, PA-C Ronald Cockayne, NP Morey Ar, NP

## 2024-01-25 NOTE — Progress Notes (Signed)
 Cardiology Office Note   Date:  01/25/2024  ID:  Vanessa Richards, DOB [birth year], MRN [id number] PCP: Marylynn Verneita CROME, MD  [facility] Providers Cardiologist:  Redell Cave, MD     History of Present Illness Vanessa Richards is a 63 y.o. female with past medical history of hypertension, hyperlipidemia, palpitations, atypical chest tightness, paroxysmal SVT, who is here today for follow-up.   She was last seen in clinic 04/23/2023 by Dr. Cave is a new patient was referred for cardiac evaluation for hyperlipidemia.  She had had complaints of chest tightness ongoing over several years but endorses shortness of breath with worsening left-sided chest tightness on exertion.  Stated father had heart attack in his 22s and her father died in his 47s.  She was very worried due to family history of CAD she is not sure if her symptoms are related to palpitations are attributable to stress or recently diagnosed sleep apnea.  Prior testing revealed a cardiac monitor 03/26/2023 with 3 occasional paroxysmal SVT episodes of PVCs and 2.2% with no significant or sustained arrhythmias and no A-fib.  Outside stress testing completed in 2013 revealed a normal LVEF 55% and no evidence for ischemia.  She was scheduled for coronary CTA and an echocardiogram.   She was last seen in clinic 05/2022 stating she been doing well.  She had been taking her antianxiety medication when previously prescribed to sleep.  She continues to have occasional swelling to her bilateral lower extremities but denies any hospitalizations or visits to the emergency department.  Previous coronary calcium  score of 0 and prior echocardiogram that was scheduled revealed LVEF 55 to 60%, no RWMA, mild to moderate mitral valve regurgitation.  She returns clinic today stating that she has been doing well from the cardiac perspective.  Unfortunately she has been out of her blood pressure medication since Saturday.  She denies any chest  pain, shortness of breath, palpitations.  She notes occasional peripheral edema when she has been sitting with her legs hanging for extended periods of time.  She denies any hospitalizations or visits to the emergency department.  ROS: 10 point review of systems has been reviewed and considered negative except ones been listed in the HPI  Studies Reviewed EKG Interpretation Date/Time:  Tuesday January 25 2024 08:44:07 EDT Ventricular Rate:  81 PR Interval:  172 QRS Duration:  80 QT Interval:  374 QTC Calculation: 434 R Axis:   24  Text Interpretation: Normal sinus rhythm Normal ECG When compared with ECG of 06-Apr-2023 09:37, No significant change was found Confirmed by Gerard Frederick ([id number]) on 01/25/2024 8:45:11 AM    cCTA 04/26/2023 IMPRESSION: 1. Coronary calcium  score of 0.   2. Normal coronary origin with right dominance.   3. No evidence of CAD.   4. CAD-RADS 0. Consider non-atherosclerotic causes of chest pain.   2D echo 04/15/2023 1. Left ventricular ejection fraction, by estimation, is 55 to 60%. The  left ventricle has normal function. The left ventricle has no regional  wall motion abnormalities. Left ventricular diastolic parameters were  normal. The average left ventricular  global longitudinal strain is -18.3 %.   2. Right ventricular systolic function is normal. The right ventricular  size is normal. Tricuspid regurgitation signal is inadequate for assessing  PA pressure.   3. The mitral valve is normal in structure. Mild to moderate mitral valve  regurgitation. No evidence of mitral stenosis.   4. The aortic valve is tricuspid. Aortic valve regurgitation is not  visualized. No aortic stenosis is present.   5. The inferior vena cava is normal in size with greater than 50%  respiratory variability, suggesting right atrial pressure of 3 mmHg.  Risk Assessment/Calculations     Physical Exam VS:  BP (!) 150/98   Pulse 81   Ht 5' 3 (1.6 m)   Wt 181 lb (82.1  kg)   SpO2 98%   BMI 32.06 kg/m        Wt Readings from Last 3 Encounters:  01/25/24 181 lb (82.1 kg)  12/08/23 178 lb 1.9 oz (80.8 kg)  06/01/23 175 lb 6.4 oz (79.6 kg)    GEN: Well nourished, well developed in no acute distress NECK: No JVD; No carotid bruits CARDIAC: RRR, no murmurs, rubs, gallops RESPIRATORY:  Clear to auscultation without rales, wheezing or rhonchi  ABDOMEN: Soft, non-tender, non-distended EXTREMITIES:  No edema; No deformity   ASSESSMENT AND PLAN Primary hypertension with blood pressure today 150/98.  Unfortunately patient has been out of her medication since Saturday.  She has been restarted on her Toprol -XL 100 mg daily and continued on triamterene -HCTZ 37.5 mg grams/25 mg daily.  She has been encouraged to continue to monitor pressure 1 to 2 hours postmedication administration at home as well.  Previous reviews of her blood pressure and other visits and states her blood pressure is better controlled on her current medication regimen prior to running out of her medicines.  EKG today reveals sinus rhythm with rate 81 with no significant changes being found.  No further ischemic valuation needed at this time  Mixed hyperlipidemia/hypertriglyceridemia with direct LDL at 67 unfortunately she had triglycerides of over 400.  She has continued on atorvastatin  20 mg daily.  She has labs are rescheduled and another follow-up with ongoing management per her PCP.  Palpitations/paroxysmal SVT with previous cardiac monitor revealed paroxysmal SVT with no significant sustained arrhythmias.  She has been out of her metoprolol  for several days.  Will get her back on Toprol -XL 100 mg daily.       Dispo: Patient to return to clinic see MD/APP in 3 months or sooner if needed for further evaluation.  Signed, Kofi Murrell, NP

## 2024-04-06 ENCOUNTER — Other Ambulatory Visit: Payer: Self-pay | Admitting: Cardiology

## 2024-04-22 DIAGNOSIS — G4733 Obstructive sleep apnea (adult) (pediatric): Secondary | ICD-10-CM | POA: Diagnosis not present

## 2024-04-23 NOTE — Progress Notes (Unsigned)
" °  Cardiology Office Note   Date: 04/24/2024  ID:  Vanessa Richards [birth year] [id number] PCP: Vanessa Verneita CROME, MD  [facility] Providers Cardiologist: Vanessa Cave, MD { Click to update primary MD,subspecialty MD or APP then REFRESH:1}    Chief Complaint: Vanessa Richards is a 63 y.o.female with PMH of hypertension, hyperlipidemia, palpitations, paroxysmal SVT, OSA, alcohol use who presents to the clinic for routine follow-up.    Vanessa Richards establish cardiology care 04/23/2023 with complaints of palpitations and chest tightness.  Monitor 03/2023 ordered by PCP showed occasional SVT, 2.2% PVCs, no other arrhythmias.  She was on Toprol -XL 100 mg daily.  Echo 04/15/2023 showed LVEF 55-60%, normal RV, mild to moderate MR, tricuspid AV.  Coronary CTA 04/26/2023 showed calcium  score of 0.  Hypertensive during last 2 office visits, but this was attributed to anxiety and being out of BP medications.   04/24/2024 called EMS for numbness in hands, knees, and feet.  Noted severe headache the day prior, stroke screen by EMS negative. Admitted to hospital with significant electrolyte disturbances.      History of Present Illness: Today ***  ?meds, ER visit, BP at home, HA, vision changes, CP, SOB, palpitations, edema, dizziness, syncope, headache, CPAP, lipid clinic  Hypertension: BP today ***  - Continue triamterene -HCTZ 37.5-25 mg daily - Continue Toprol -XL 200 mg daily  Hyperlipidemia: 12/08/2023: Direct LDL 67.0; HDL 44.20 04/24/2024: ALT 17, TGs 456. Unfortunately has not tolerated fenofibrate  in the past due to palpitations. - Continue atorvastatin  20 mg daily - ??niacin vs omega-3 vs lipid clinic appt - ???CMP, lipids 6-8 weeks  Palpitations/paroxysmal SVT: Regular rhythm upon auscultation. Denies further symptoms. - Continue Toprol -XL 100 mg daily  OSA: ***Not on CPAP. - Encourage compliance   ROS: Please see the history of present illness. All other systems reviewed and  are negative.   Studies Reviewed: The following studies were personally reviewed today ***:      Risk Assessment/Calculations:  {Does this patient have ATRIAL FIBRILLATION?:[phone]}              Physical Exam: VS: There were no vitals taken for this visit. Wt Readings from Last 3 Encounters:  04/24/24 174 lb (78.9 kg)  01/25/24 181 lb (82.1 kg)  12/08/23 178 lb 1.9 oz (80.8 kg)     GEN: *** Well nourished, in NAD HEENT: Normal NECK: No JVD, no carotid bruits LYMPHATICS: No lymphadenopathy CARDIAC: ***RRR, no murmurs, rubs, gallops RESPIRATORY: Clear to auscultation without rales, wheezing or rhonchi  ABDOMEN: Soft, non-tender, non-distended MUSCULOSKELETAL: No edema, no deformity  SKIN: Warm and dry NEUROLOGIC:  Alert and oriented x 3 PSYCHIATRIC:  Normal affect   Assessment & Plan: ***    {Are you ordering a CV Procedure (e.g. stress test, cath, DCCV, TEE, etc)?   Press F2        :[phone]}   Dispo: ***  Signed, Vanessa GORMAN Cleaves, NP 04/24/2024 7:33 PM [facility] "

## 2024-04-24 ENCOUNTER — Observation Stay: Admission: EM | Admit: 2024-04-24 | Discharge: 2024-04-25 | Disposition: A | Discharge status: Still In Hospital

## 2024-04-24 ENCOUNTER — Other Ambulatory Visit: Payer: Self-pay

## 2024-04-24 ENCOUNTER — Emergency Department

## 2024-04-24 DIAGNOSIS — R21 Rash and other nonspecific skin eruption: Secondary | ICD-10-CM | POA: Diagnosis not present

## 2024-04-24 DIAGNOSIS — E785 Hyperlipidemia, unspecified: Secondary | ICD-10-CM | POA: Insufficient documentation

## 2024-04-24 DIAGNOSIS — R200 Anesthesia of skin: Secondary | ICD-10-CM | POA: Diagnosis not present

## 2024-04-24 DIAGNOSIS — F32A Depression, unspecified: Secondary | ICD-10-CM | POA: Diagnosis not present

## 2024-04-24 DIAGNOSIS — R531 Weakness: Secondary | ICD-10-CM

## 2024-04-24 DIAGNOSIS — I10 Essential (primary) hypertension: Secondary | ICD-10-CM | POA: Insufficient documentation

## 2024-04-24 DIAGNOSIS — E876 Hypokalemia: Secondary | ICD-10-CM | POA: Diagnosis not present

## 2024-04-24 DIAGNOSIS — G4733 Obstructive sleep apnea (adult) (pediatric): Secondary | ICD-10-CM | POA: Insufficient documentation

## 2024-04-24 DIAGNOSIS — Z79899 Other long term (current) drug therapy: Secondary | ICD-10-CM | POA: Diagnosis not present

## 2024-04-24 DIAGNOSIS — E878 Other disorders of electrolyte and fluid balance, not elsewhere classified: Principal | ICD-10-CM | POA: Insufficient documentation

## 2024-04-24 DIAGNOSIS — F419 Anxiety disorder, unspecified: Secondary | ICD-10-CM | POA: Insufficient documentation

## 2024-04-24 DIAGNOSIS — G43909 Migraine, unspecified, not intractable, without status migrainosus: Secondary | ICD-10-CM | POA: Insufficient documentation

## 2024-04-24 DIAGNOSIS — F418 Other specified anxiety disorders: Secondary | ICD-10-CM | POA: Diagnosis not present

## 2024-04-24 DIAGNOSIS — E669 Obesity, unspecified: Secondary | ICD-10-CM | POA: Insufficient documentation

## 2024-04-24 DIAGNOSIS — Z6830 Body mass index (BMI) 30.0-30.9, adult: Secondary | ICD-10-CM | POA: Diagnosis not present

## 2024-04-24 DIAGNOSIS — F1090 Alcohol use, unspecified, uncomplicated: Secondary | ICD-10-CM | POA: Diagnosis not present

## 2024-04-24 DIAGNOSIS — R202 Paresthesia of skin: Secondary | ICD-10-CM | POA: Diagnosis not present

## 2024-04-24 DIAGNOSIS — D72829 Elevated white blood cell count, unspecified: Secondary | ICD-10-CM | POA: Diagnosis not present

## 2024-04-24 LAB — CBC WITH DIFFERENTIAL/PLATELET
Abs Immature Granulocytes: 0.15 K/uL — ABNORMAL HIGH (ref 0.00–0.07)
Basophils Absolute: 0.1 K/uL (ref 0.0–0.1)
Basophils Relative: 1 %
Eosinophils Absolute: 0.4 K/uL (ref 0.0–0.5)
Eosinophils Relative: 3 %
HCT: 36.2 % (ref 36.0–46.0)
Hemoglobin: 12.3 g/dL (ref 12.0–15.0)
Immature Granulocytes: 1 %
Lymphocytes Relative: 30 %
Lymphs Abs: 3.2 K/uL (ref 0.7–4.0)
MCH: 29.6 pg (ref 26.0–34.0)
MCHC: 34 g/dL (ref 30.0–36.0)
MCV: 87.2 fL (ref 80.0–100.0)
Monocytes Absolute: 0.6 K/uL (ref 0.1–1.0)
Monocytes Relative: 5 %
Neutro Abs: 6.4 K/uL (ref 1.7–7.7)
Neutrophils Relative %: 60 %
Platelets: 299 K/uL (ref 150–400)
RBC: 4.15 MIL/uL (ref 3.87–5.11)
RDW: 13.2 % (ref 11.5–15.5)
WBC: 10.7 K/uL — ABNORMAL HIGH (ref 4.0–10.5)
nRBC: 0 % (ref 0.0–0.2)

## 2024-04-24 LAB — COMPREHENSIVE METABOLIC PANEL WITH GFR
ALT: 17 U/L (ref 0–44)
AST: 25 U/L (ref 15–41)
Albumin: 4.2 g/dL (ref 3.5–5.0)
Alkaline Phosphatase: 101 U/L (ref 38–126)
Anion gap: 15 (ref 5–15)
BUN: 11 mg/dL (ref 8–23)
CO2: 24 mmol/L (ref 22–32)
Calcium: 8 mg/dL — ABNORMAL LOW (ref 8.9–10.3)
Chloride: 101 mmol/L (ref 98–111)
Creatinine, Ser: 0.74 mg/dL (ref 0.44–1.00)
GFR, Estimated: >60 mL/min
Glucose, Bld: 167 mg/dL — ABNORMAL HIGH (ref 70–99)
Potassium: 2.8 mmol/L — ABNORMAL LOW (ref 3.5–5.1)
Sodium: 140 mmol/L (ref 135–145)
Total Bilirubin: 0.3 mg/dL (ref 0.0–1.2)
Total Protein: 7.1 g/dL (ref 6.5–8.1)

## 2024-04-24 LAB — URINALYSIS, ROUTINE W REFLEX MICROSCOPIC
Bilirubin Urine: NEGATIVE
Glucose, UA: NEGATIVE mg/dL
Ketones, ur: NEGATIVE mg/dL
Nitrite: NEGATIVE
Protein, ur: NEGATIVE mg/dL
Specific Gravity, Urine: 1.01 (ref 1.005–1.030)
pH: 6 (ref 5.0–8.0)

## 2024-04-24 LAB — TROPONIN T, HIGH SENSITIVITY: Troponin T High Sensitivity: <15 ng/L (ref 0–19)

## 2024-04-24 LAB — MAGNESIUM: Magnesium: 1.1 mg/dL — ABNORMAL LOW (ref 1.7–2.4)

## 2024-04-24 LAB — ETHANOL: Alcohol, Ethyl (B): <15 mg/dL

## 2024-04-24 MED ORDER — SUMATRIPTAN SUCCINATE 50 MG PO TABS: 25.0000 mg | ORAL_TABLET | ORAL | Status: DC | PRN

## 2024-04-24 MED ORDER — THIAMINE HCL 100 MG/ML IJ SOLN
100.0000 mg | Freq: Every day | INTRAMUSCULAR | Status: DC
  Filled 2024-04-24: qty 2

## 2024-04-24 MED ORDER — MAGNESIUM SULFATE 2 GM/50ML IV SOLN
2.0000 g | Freq: Once | INTRAVENOUS | Status: AC
  Administered 2024-04-24: 2 g via INTRAVENOUS
  Filled 2024-04-24: qty 50

## 2024-04-24 MED ORDER — ATORVASTATIN CALCIUM 20 MG PO TABS
20.0000 mg | ORAL_TABLET | Freq: Every day | ORAL | Status: DC
  Administered 2024-04-24 – 2024-04-25 (×2): 20 mg via ORAL
  Filled 2024-04-24 (×2): qty 1

## 2024-04-24 MED ORDER — FOLIC ACID 1 MG PO TABS
1.0000 mg | ORAL_TABLET | Freq: Every day | ORAL | Status: DC
  Administered 2024-04-24 – 2024-04-25 (×2): 1 mg via ORAL
  Filled 2024-04-24 (×2): qty 1

## 2024-04-24 MED ORDER — POTASSIUM CHLORIDE CRYS ER 20 MEQ PO TBCR
40.0000 meq | EXTENDED_RELEASE_TABLET | Freq: Once | ORAL | Status: AC
  Administered 2024-04-24: 40 meq via ORAL
  Filled 2024-04-24: qty 2

## 2024-04-24 MED ORDER — PANTOPRAZOLE SODIUM 40 MG PO TBEC
40.0000 mg | DELAYED_RELEASE_TABLET | Freq: Every day | ORAL | Status: DC
  Administered 2024-04-25: 40 mg via ORAL
  Filled 2024-04-24: qty 1

## 2024-04-24 MED ORDER — ENOXAPARIN SODIUM 40 MG/0.4ML IJ SOSY
40.0000 mg | PREFILLED_SYRINGE | Freq: Every day | INTRAMUSCULAR | Status: DC
  Administered 2024-04-24: 40 mg via SUBCUTANEOUS
  Filled 2024-04-24: qty 0.4

## 2024-04-24 MED ORDER — CALCIUM GLUCONATE-NACL 1-0.675 GM/50ML-% IV SOLN
1.0000 g | Freq: Once | INTRAVENOUS | Status: AC
  Administered 2024-04-24: 1000 mg via INTRAVENOUS
  Filled 2024-04-24: qty 50

## 2024-04-24 MED ORDER — ESCITALOPRAM OXALATE 10 MG PO TABS
10.0000 mg | ORAL_TABLET | Freq: Every day | ORAL | Status: DC
  Administered 2024-04-25: 10 mg via ORAL
  Filled 2024-04-24: qty 1

## 2024-04-24 MED ORDER — ALPRAZOLAM 0.5 MG PO TABS: 0.5000 mg | ORAL_TABLET | Freq: Two times a day (BID) | ORAL | Status: DC | PRN

## 2024-04-24 MED ORDER — THIAMINE MONONITRATE 100 MG PO TABS
100.0000 mg | ORAL_TABLET | Freq: Every day | ORAL | Status: DC
  Administered 2024-04-25: 100 mg via ORAL
  Filled 2024-04-24 (×2): qty 1

## 2024-04-24 MED ORDER — ADULT MULTIVITAMIN W/MINERALS CH
1.0000 | ORAL_TABLET | Freq: Every day | ORAL | Status: DC
  Administered 2024-04-24 – 2024-04-25 (×2): 1 via ORAL
  Filled 2024-04-24 (×2): qty 1

## 2024-04-24 MED ORDER — LORAZEPAM 2 MG/ML IJ SOLN: 0.0000 mg | Freq: Two times a day (BID) | INTRAMUSCULAR | Status: DC

## 2024-04-24 MED ORDER — LORAZEPAM 2 MG/ML IJ SOLN: 1.0000 mg | INTRAMUSCULAR | Status: DC | PRN

## 2024-04-24 MED ORDER — LORAZEPAM 1 MG PO TABS: 1.0000 mg | ORAL_TABLET | ORAL | Status: DC | PRN

## 2024-04-24 MED ORDER — LORAZEPAM 2 MG/ML IJ SOLN: 0.0000 mg | Freq: Four times a day (QID) | INTRAMUSCULAR | Status: DC

## 2024-04-24 MED ORDER — TRAZODONE HCL 50 MG PO TABS: 50.0000 mg | ORAL_TABLET | Freq: Every evening | ORAL | Status: DC | PRN

## 2024-04-24 MED ORDER — ZOLPIDEM TARTRATE 5 MG PO TABS
5.0000 mg | ORAL_TABLET | Freq: Every evening | ORAL | Status: DC | PRN
  Administered 2024-04-24: 5 mg via ORAL
  Filled 2024-04-24: qty 1

## 2024-04-24 MED ORDER — ZOLPIDEM TARTRATE 5 MG PO TABS: 5.0000 mg | ORAL_TABLET | Freq: Every evening | ORAL | Status: DC | PRN

## 2024-04-24 MED ORDER — METOPROLOL SUCCINATE ER 50 MG PO TB24
100.0000 mg | ORAL_TABLET | Freq: Every day | ORAL | Status: DC
  Administered 2024-04-24 – 2024-04-25 (×2): 100 mg via ORAL
  Filled 2024-04-24 (×2): qty 2

## 2024-04-24 NOTE — ED Provider Notes (Signed)
 "  [facility] ([facility]) Provider Note    Event Date/Time   First MD Initiated Contact with Patient 04/24/24 1714     (approximate)   History   Numbness   HPI  Vanessa Richards is a 63 y.o. female with a past medical history of hypertension, migraine syndrome, presenting to the emergency department via EMS for numbness in her nose, hands, and knees.  Patient reports that yesterday she had a headache and noticed a tingling sensation to her nose.  She reports that today around 4 PM she began having numbness to bilateral hands and bilateral knees and started to develop headache again.  She states that she felt as if she could not unplugs her hands and felt like she was having difficulty talking and so called her husband who called EMS for her.  She reports that the symptoms are improving now and she denies any slurred speech or difficulty speaking.  She denies any continued numbness and denies any weakness.  Does not take any blood thinners.     Physical Exam   Triage Vital Signs: ED Triage Vitals  Encounter Vitals Group     BP 04/24/24 1718 (!) 182/127     Girls Systolic BP Percentile --      Girls Diastolic BP Percentile --      Boys Systolic BP Percentile --      Boys Diastolic BP Percentile --      Pulse Rate 04/24/24 1718 97     Resp 04/24/24 1718 16     Temp 04/24/24 1718 99.1 F (37.3 C)     Temp Source 04/24/24 1718 Oral     SpO2 04/24/24 1718 99 %     Weight 04/24/24 1716 174 lb (78.9 kg)     Height 04/24/24 1716 5' 3 (1.6 m)     Head Circumference --      Peak Flow --      Pain Score 04/24/24 1716 0     Pain Loc --      Pain Education --      Exclude from Growth Chart --     Most recent vital signs: Vitals:   04/24/24 1800 04/24/24 1830  BP: (!) 141/129 (!) 158/76  Pulse: 86 87  Resp: (!) 21 20  Temp:    SpO2: 99% 98%     General: Awake, no distress.  CV:  Good peripheral perfusion.  Resp:  Normal effort.  Abd:  No distention.   Other:  NIH of 1 with reported different sensation to the left side of face   ED Results / Procedures / Treatments   Labs (all labs ordered are listed, but only abnormal results are displayed) Labs Reviewed  COMPREHENSIVE METABOLIC PANEL WITH GFR - Abnormal; Notable for the following components:      Result Value   Potassium 2.8 (*)    Glucose, Bld 167 (*)    Calcium  8.0 (*)    All other components within normal limits  CBC WITH DIFFERENTIAL/PLATELET - Abnormal; Notable for the following components:   WBC 10.7 (*)    Abs Immature Granulocytes 0.15 (*)    All other components within normal limits  URINALYSIS, ROUTINE W REFLEX MICROSCOPIC - Abnormal; Notable for the following components:   Color, Urine STRAW (*)    APPearance CLEAR (*)    Hgb urine dipstick MODERATE (*)    Leukocytes,Ua TRACE (*)    Bacteria, UA RARE (*)    All other components within normal  limits  MAGNESIUM  - Abnormal; Notable for the following components:   Magnesium  1.1 (*)    All other components within normal limits  ETHANOL  TROPONIN T, HIGH SENSITIVITY     EKG  ED ECG REPORT I, Reche CHRISTELLA Leventhal, the attending physician, personally viewed and interpreted this ECG.  Date: 04/24/2024 Rate: 91 bpm Rhythm: Sinus rhythm with occasional PVCs QRS Axis: normal Intervals: normal ST/T Wave abnormalities: normal Narrative Interpretation: no evidence of acute ischemia    RADIOLOGY Head CT: No acute intracranial pathology    PROCEDURES:  Critical Care performed: No  Procedures   MEDICATIONS ORDERED IN ED: Medications - No data to display   IMPRESSION / MDM / ASSESSMENT AND PLAN / ED COURSE  I reviewed the triage vital signs and the nursing notes.                              Differential diagnosis includes, but is not limited to, ischemic stroke, ICH, electrolyte abnormalities, metabolic encephalopathy, complex migraine  Patient's presentation is most consistent with acute  presentation with potential threat to life or bodily function.  Patient is a 63 year old female presenting to the emergency department complaining of numbness to bilateral hands, knees, and nose which is improving at this time.  NIH was 0 for EMS.  Here in the emergency department will give her an NIH of 1 for reported different sensation to the left side of face.  Neuroexam is otherwise unremarkable.  Head CT does not show any acute findings.  On workup the patient has a potassium of 2.8, magnesium  of 1.1, and calcium  of 8.0.   I discussed findings at length with the patient who reports that all of her symptoms have resolved at this time.  Discussed plan for repletion of her electrolytes and she is agreeable.  I discussed the patient's case with the hospitalist who is in agreement with plan for admission at this time.      FINAL CLINICAL IMPRESSION(S) / ED DIAGNOSES   Final diagnoses:  Paresthesias  Hypokalemia  Hypomagnesemia  Hypocalcemia     Rx / DC Orders   ED Discharge Orders     None        Note:  This document was prepared using Dragon voice recognition software and may include unintentional dictation errors.   Leventhal Reche CHRISTELLA, MD 04/24/24 2150  "

## 2024-04-24 NOTE — Consult Note (Signed)
 PHARMACY CONSULT NOTE - FOLLOW UP  Pharmacy Consult for Electrolyte Monitoring and Replacement   Recent Labs: Potassium (mmol/L)  Date Value  04/24/2024 2.8 (L)   Magnesium  (mg/dL)  Date Value  87/77/7974 1.1 (L)   Calcium  (mg/dL)  Date Value  87/77/7974 8.0 (L)   Albumin (g/dL)  Date Value  87/77/7974 4.2   Sodium (mmol/L)  Date Value  04/24/2024 140  04/06/2023 146 (H)     Assessment: 63 y/o female with  PMH of HTN, migraines presenting to the ED with electrolyte disturbance, numbness in her nose, hands, and knees. Pharmacy was consulted to follow and replace electrolytes.   Goal of Therapy:  Electrolytes WNL  Plan:  MD placed order for Mag 2G IVPB x2, Potassium 40 meq x2 , and Calcium  gluconate 1G x1.  Will follow electrolytes with AM labs tomorrow    Vanessa Richards ,PharmD Clinical Pharmacist 04/24/2024 8:56 PM

## 2024-04-24 NOTE — ED Notes (Signed)
 Admitting MD at the bedside for pt evaluation.

## 2024-04-24 NOTE — H&P (Signed)
 " History and Physical    Vanessa Richards FMW:[id number] DOB: Feb 10, 1961 DOA: 04/24/2024  Referring MD/NP/PA:   PCP: Marylynn Verneita CROME, MD   Patient coming from:  The patient is coming from home.     Chief Complaint: Numbness and tingling  HPI: Vanessa Richards is a 63 y.o. female with medical history significant of alcohol use, HTN, HLD, pre-DM, GERD, depression with anxiety, migraine, obesity, OSAS on CPAP, who presents with numbness and tingling.  Patient states that yesterday she had headache which has resolved today. She also had numbness to her nose yesterday, described as feather feeling.  Today at about 4 PM, she started feeling numb and tingling in her nose, both hands and both knees.  She states that the numbness feeling spreaded up to her forehead.  She also reports difficulty talking.  No unilateral weakness in the extremities.  No facial droop.  She reports that the symptoms are improving in ED. Her difficult talking has resolved. Patient does not have chest pain, cough, fever or chills.  She had some SOB earlier which has resolved.  No nausea, vomiting, diarrhea or abdominal pain.  She states that she had erythematous rash in her abdomen yesterday which has resolved.  Today she noticed petechial rash in her right hand, no pain or itchy.  No symptoms UTI.  Patient states that she drinks beer 4 times per week, 3-4 beers each time.  Data reviewed independently and ED Course: pt was found to have electrolytes disturbance with potassium 2.8, magnesium  of 1.1, calcium  8.0, phosphorus 1.9, troponin less than 15, negative UA, GFR> 60, WBC 10.7, alcohol level less than 15.  Temperature 99.1, blood pressure 182/127, 158/70, heart rate 80-90s, RR 20, oxygen saturation 98% on room air.  CT of head negative for acute intracranial abnormalities.  Patient is placed in telemetry bed for observation.   EKG: I have personally reviewed.  Sinus rhythm, QTc 448, early R wave progression, PVC.   Review  of Systems:   General: no fevers, chills, no body weight gain, has poor appetite, has fatigue HEENT: no blurry vision, hearing changes or sore throat Respiratory: no dyspnea, coughing, wheezing CV: no chest pain, no palpitations GI: no nausea, vomiting, abdominal pain, diarrhea, constipation GU: no dysuria, burning on urination, increased urinary frequency, hematuria  Ext: no leg edema Neuro: no unilateral weakness, numbness, or tingling, no vision change or hearing loss.  Has numbness and tingling.  Has difficulty talking. Skin: Has rash, no skin tear. MSK: No muscle spasm, no deformity, no limitation of range of movement in spin Heme: No easy bruising.  Travel history: No recent long distant travel.   Allergy: Allergies[1]  Past Medical History:  Diagnosis Date   COVID-19 06/03/2020   GERD (gastroesophageal reflux disease)    Hyperlipidemia    Hypertension    Migraine syndrome     Past Surgical History:  Procedure Laterality Date   COLONOSCOPY WITH PROPOFOL  N/A 03/15/2015   Procedure: COLONOSCOPY WITH PROPOFOL ;  Surgeon: Gladis RAYMOND Mariner, MD;  Location: [facility] ENDOSCOPY;  Service: Endoscopy;  Laterality: N/A;   DILATION AND CURETTAGE OF UTERUS     ESOPHAGOGASTRODUODENOSCOPY (EGD) WITH PROPOFOL  N/A 03/15/2015   Procedure: ESOPHAGOGASTRODUODENOSCOPY (EGD) WITH PROPOFOL ;  Surgeon: Gladis RAYMOND Mariner, MD;  Location: [facility] ENDOSCOPY;  Service: Endoscopy;  Laterality: N/A;   TUBAL LIGATION      Social History:  reports that she has never smoked. She has never used smokeless tobacco. She reports current alcohol use of about 2.0  standard drinks of alcohol per week. She reports that she does not use drugs.  Family History:  Family History  Problem Relation Age of Onset   Heart disease Father 7       AMI, smoker. angina in his 12's    Hypertension Father    Arthritis Sister 52       rheumaotid arthritis    Heart disease Paternal Grandfather 77       AMI   Early death Paternal  Uncle    Breast cancer Neg Hx      Prior to Admission medications  Medication Sig Start Date End Date Taking? Authorizing Provider  ALPRAZolam  (XANAX ) 0.5 MG tablet TAKE ONE TABLET BY MOUTH AT BEDTIME AS NEEDED FOR ANXIETY 12/27/23   Marylynn Verneita CROME, MD  atorvastatin  (LIPITOR) 20 MG tablet TAKE 1 TABLET(20 MG) BY MOUTH DAILY 04/06/24   Gerard Frederick, NP  betamethasone  dipropionate 0.05 % cream Apply topically 2 (two) times daily. Until rash has resolved 03/03/23   Marylynn Verneita CROME, MD  doxycycline  (VIBRA -TABS) 100 MG tablet Take 1 tablet (100 mg total) by mouth 2 (two) times daily. 12/27/23   Marylynn Verneita CROME, MD  escitalopram  (LEXAPRO ) 10 MG tablet Take 1 tablet (10 mg total) by mouth daily. 11/16/23   Marylynn Verneita CROME, MD  fluticasone  (FLONASE ) 50 MCG/ACT nasal spray Place 2 sprays into both nostrils daily. 01/25/19   Osker Tinnie HERO, FNP  loratadine  (CLARITIN ) 10 MG tablet TAKE ONE TABLET BY MOUTH ONCE DAILY 05/27/21   Marylynn Verneita CROME, MD  meloxicam  (MOBIC ) 15 MG tablet TAKE 1 TABLET(15 MG) BY MOUTH DAILY 01/16/24   Standiford, Marsa FALCON, DPM  metoprolol  succinate (TOPROL -XL) 100 MG 24 hr tablet TAKE 1 TABLET BY MOUTH WITH OR IMMEDIATELY FOLLOWING A MEAL 01/25/24   Hammock, Sheri, NP  omeprazole  (PRILOSEC) 20 MG capsule TAKE 1 CAPSULE BY MOUTH EVERY DAY 12/08/23   Marylynn Verneita CROME, MD  SUMAtriptan  (IMITREX ) 25 MG tablet Take 1 tablet at the onset of headache) every 2 hours as needed  not to exceed 8 in 24 hours 10/11/23   Marylynn Verneita CROME, MD  traZODone  (DESYREL ) 50 MG tablet Take 0.5-1 tablets (25-50 mg total) by mouth at bedtime as needed for sleep. 08/26/22   Marylynn Verneita CROME, MD  triamterene -hydrochlorothiazide  (DYAZIDE) 37.5-25 MG capsule Take 1 each (1 capsule total) by mouth every morning. 01/25/24 01/24/25  Gerard Frederick, NP    Physical Exam: Vitals:   04/24/24 2300 04/25/24 0021 04/25/24 0100 04/25/24 0200  BP: (!) 168/85 (!) 170/76 (!) 156/72 137/67  Pulse: 86 83 76 80  Resp: 16  (!) 23 (!) 22   Temp: 97.8 F (36.6 C)     TempSrc: Oral     SpO2: 98%  96% 92%  Weight:      Height:       General: Not in acute distress HEENT:       Eyes: PERRL, EOMI, no jaundice       ENT: No discharge from the ears and nose, no pharynx injection, no tonsillar enlargement.        Neck: No JVD, no bruit, no mass felt. Heme: No neck lymph node enlargement. Cardiac: S1/S2, RRR, No murmurs, No gallops or rubs. Respiratory: No rales, wheezing, rhonchi or rubs. GI: Soft, nondistended, nontender, no rebound pain, no organomegaly, BS present. GU: No hematuria Ext: No pitting leg edema bilaterally. 1+DP/PT pulse bilaterally. Musculoskeletal: No joint deformities, No joint redness or warmth, no limitation of ROM in  spin. Skin: Has erythematous petechial rash in dorsal aspects of right hand Neuro: Alert, oriented X3, cranial nerves II-XII grossly intact, moves all extremities normally. Muscle strength 5/5 in all extremities, sensation to light touch intact.  Psych: Patient is not psychotic, no suicidal or hemocidal ideation.  Labs on Admission: I have personally reviewed following labs and imaging studies  CBC: Recent Labs  Lab 04/24/24 1730  WBC 10.7*  NEUTROABS 6.4  HGB 12.3  HCT 36.2  MCV 87.2  PLT 299   Basic Metabolic Panel: Recent Labs  Lab 04/24/24 1730  NA 140  K 2.8*  CL 101  CO2 24  GLUCOSE 167*  BUN 11  CREATININE 0.74  CALCIUM  8.0*  MG 1.1*  PHOS 1.9*   GFR: Estimated Creatinine Clearance: 71.6 mL/min (by C-G formula based on SCr of 0.74 mg/dL). Liver Function Tests: Recent Labs  Lab 04/24/24 1730  AST 25  ALT 17  ALKPHOS 101  BILITOT 0.3  PROT 7.1  ALBUMIN 4.2   No results for input(s): LIPASE, AMYLASE in the last 168 hours. No results for input(s): AMMONIA in the last 168 hours. Coagulation Profile: No results for input(s): INR, PROTIME in the last 168 hours. Cardiac Enzymes: No results for input(s): CKTOTAL, CKMB, CKMBINDEX, TROPONINI  in the last 168 hours. BNP (last 3 results) No results for input(s): PROBNP in the last 8760 hours. HbA1C: No results for input(s): HGBA1C in the last 72 hours. CBG: No results for input(s): GLUCAP in the last 168 hours. Lipid Profile: No results for input(s): CHOL, HDL, LDLCALC, TRIG, CHOLHDL, LDLDIRECT in the last 72 hours. Thyroid  Function Tests: No results for input(s): TSH, T4TOTAL, FREET4, T3FREE, THYROIDAB in the last 72 hours. Anemia Panel: No results for input(s): VITAMINB12, FOLATE, FERRITIN, TIBC, IRON, RETICCTPCT in the last 72 hours. Urine analysis:    Component Value Date/Time   COLORURINE STRAW (A) 04/24/2024 1745   APPEARANCEUR CLEAR (A) 04/24/2024 1745   LABSPEC 1.010 04/24/2024 1745   PHURINE 6.0 04/24/2024 1745   GLUCOSEU NEGATIVE 04/24/2024 1745   HGBUR MODERATE (A) 04/24/2024 1745   BILIRUBINUR NEGATIVE 04/24/2024 1745   BILIRUBINUR neg 03/22/2015 1419   KETONESUR NEGATIVE 04/24/2024 1745   PROTEINUR NEGATIVE 04/24/2024 1745   UROBILINOGEN 0.2 03/22/2015 1419   NITRITE NEGATIVE 04/24/2024 1745   LEUKOCYTESUR TRACE (A) 04/24/2024 1745   Sepsis Labs: @LABRCNTIP (procalcitonin:4,lacticidven:4) )No results found for this or any previous visit (from the past 240 hours).   Radiological Exams on Admission:   Assessment/Plan Principal Problem:   Electrolyte disturbance Active Problems:   Hypokalemia   Hypomagnesemia   Hypocalcemia   Hypophosphatemia   Numbness   Hypertension   Alcohol use   HLD (hyperlipidemia)   Migraine syndrome   Leukocytosis   Rash in right hand   Depression with anxiety   Obesity (BMI 30-39.9)   OSA on CPAP   Assessment and Plan:  Electrolyte disturbance including hypokalemiam, hypomagnesemia, hypocalcemia and hypophosphatemia: potassium 2.8, magnesium  of 1.1, calcium  8.0, phosphorus 1.9.  Likely due to alcohol use. - Placed in telemetry plan for outpatient - Repleted potassium,  magnesium , calcium  and phosphorus. - Consulted pharmacist for monitoring electrolytes - Hold Dyazide  Numbness: Her numbness feelings likely due to multiple electrolytes disturbance.  CT head negative.  Low suspicions for stroke. -Check vitamin B12 level - Patient is given folic acid  and vitamin B1 as part of CIWA protocol  Hypertension -Hold Dyazide - Continue metoprolol  - IV hydralazine as needed  Alcohol use -Counseling about importance of  quitting alcohol use - CIWA protocol  HLD (hyperlipidemia) -Lipitor  Migraine syndrome -As needed sumatriptan   Leukocytosis: WBC 10.7, no fever, likely reactive. -Follow-up with CBC  Rash in right hand: Patient has petechial rash in the dorsal aspects of her right hand.  Etiology is not clear.  No itching or pain. -Observe closely  Depression with anxiety -Continue home medications  Obesity (BMI 30-39.9): Patient has Obesity Class I, with body weight  78.9 Kg and BMI 30.82 kg/m2.  - Encourage losing weight - Exercise and healthy diet chief  OSA on CPAP      DVT ppx:  SQ Lovenox   Code Status: Full code   Family Communication:    yes, patient's sister and has been      Disposition Plan:  Anticipate discharge back to previous environment  Consults called: None  Admission status and Level of care: Telemetry:    for obs as inpt        Dispo: The patient is from: Home              Anticipated d/c is to: Home              Anticipated d/c date is: 1 day              Patient currently is not medically stable to d/c.    Severity of Illness:  The appropriate patient status for this patient is OBSERVATION. Observation status is judged to be reasonable and necessary in order to provide the required intensity of service to ensure the patient's safety. The patient's presenting symptoms, physical exam findings, and initial radiographic and laboratory data in the context of their medical condition is felt to place them at  decreased risk for further clinical deterioration. Furthermore, it is anticipated that the patient will be medically stable for discharge from the hospital within 2 midnights of admission.        Date of Service 04/25/2024    Caleb Exon [facility]   If 7PM-7AM, please contact night-coverage [email or web address] 04/25/2024, 2:28 AM     [1]  Allergies Allergen Reactions   Tricor  [Fenofibrate ] Other (See Comments)    Intolerance    "

## 2024-04-24 NOTE — ED Notes (Signed)
 Patient ambulated to toilet with minimal assistance to provide urine sample. Steady gait observed, no complaints of weakness or dizziness verbalized by patient.

## 2024-04-24 NOTE — ED Notes (Signed)
 Pt provided w/ sandwich and chips per request.   Fall precautions in place; pt's sister remains at bedside.

## 2024-04-24 NOTE — ED Triage Notes (Signed)
 Patient arrives via [facility]. Patient pulled over on side of road after feeling some numbness in hands, knees and feet. Patient stated she had a severe headache yesterday. EMS stroke screen was negative. Patient started feeling numbness and headache around 4 pm.  MD at bedside doing NIH stoke scale. Alert and oriented x4.

## 2024-04-25 ENCOUNTER — Ambulatory Visit: Admitting: Cardiology

## 2024-04-25 DIAGNOSIS — F1090 Alcohol use, unspecified, uncomplicated: Secondary | ICD-10-CM

## 2024-04-25 DIAGNOSIS — I10 Essential (primary) hypertension: Secondary | ICD-10-CM

## 2024-04-25 DIAGNOSIS — I471 Supraventricular tachycardia, unspecified: Secondary | ICD-10-CM

## 2024-04-25 DIAGNOSIS — E782 Mixed hyperlipidemia: Secondary | ICD-10-CM

## 2024-04-25 DIAGNOSIS — R202 Paresthesia of skin: Secondary | ICD-10-CM | POA: Diagnosis not present

## 2024-04-25 DIAGNOSIS — G4733 Obstructive sleep apnea (adult) (pediatric): Secondary | ICD-10-CM

## 2024-04-25 DIAGNOSIS — R531 Weakness: Secondary | ICD-10-CM

## 2024-04-25 DIAGNOSIS — R21 Rash and other nonspecific skin eruption: Secondary | ICD-10-CM

## 2024-04-25 DIAGNOSIS — E878 Other disorders of electrolyte and fluid balance, not elsewhere classified: Secondary | ICD-10-CM | POA: Diagnosis not present

## 2024-04-25 DIAGNOSIS — G43909 Migraine, unspecified, not intractable, without status migrainosus: Secondary | ICD-10-CM | POA: Diagnosis not present

## 2024-04-25 DIAGNOSIS — R200 Anesthesia of skin: Secondary | ICD-10-CM | POA: Diagnosis not present

## 2024-04-25 DIAGNOSIS — E876 Hypokalemia: Secondary | ICD-10-CM | POA: Diagnosis not present

## 2024-04-25 DIAGNOSIS — E785 Hyperlipidemia, unspecified: Secondary | ICD-10-CM | POA: Diagnosis not present

## 2024-04-25 LAB — CBC
HCT: 37.7 % (ref 36.0–46.0)
Hemoglobin: 12.6 g/dL (ref 12.0–15.0)
MCH: 29.6 pg (ref 26.0–34.0)
MCHC: 33.4 g/dL (ref 30.0–36.0)
MCV: 88.7 fL (ref 80.0–100.0)
Platelets: 288 K/uL (ref 150–400)
RBC: 4.25 MIL/uL (ref 3.87–5.11)
RDW: 13.2 % (ref 11.5–15.5)
WBC: 10.7 K/uL — ABNORMAL HIGH (ref 4.0–10.5)
nRBC: 0 % (ref 0.0–0.2)

## 2024-04-25 LAB — BASIC METABOLIC PANEL WITH GFR
Anion gap: 11 (ref 5–15)
BUN: 8 mg/dL (ref 8–23)
CO2: 25 mmol/L (ref 22–32)
Calcium: 8.6 mg/dL — ABNORMAL LOW (ref 8.9–10.3)
Chloride: 105 mmol/L (ref 98–111)
Creatinine, Ser: 0.66 mg/dL (ref 0.44–1.00)
GFR, Estimated: >60 mL/min
Glucose, Bld: 129 mg/dL — ABNORMAL HIGH (ref 70–99)
Potassium: 3.8 mmol/L (ref 3.5–5.1)
Sodium: 140 mmol/L (ref 135–145)

## 2024-04-25 LAB — MAGNESIUM: Magnesium: 2.7 mg/dL — ABNORMAL HIGH (ref 1.7–2.4)

## 2024-04-25 LAB — PHOSPHORUS: Phosphorus: 1.9 mg/dL — ABNORMAL LOW (ref 2.5–4.6)

## 2024-04-25 MED ORDER — ADULT MULTIVITAMIN W/MINERALS CH: 1.0000 | ORAL_TABLET | Freq: Every day | ORAL | 1 refills | Status: AC

## 2024-04-25 MED ORDER — FOLIC ACID 1 MG PO TABS: 1.0000 mg | ORAL_TABLET | Freq: Every day | ORAL | 0 refills | Status: AC

## 2024-04-25 MED ORDER — VITAMIN B-1 100 MG PO TABS: 100.0000 mg | ORAL_TABLET | Freq: Every day | ORAL | 1 refills | Status: AC

## 2024-04-25 MED ORDER — POTASSIUM PHOSPHATES 15 MMOLE/5ML IV SOLN
30.0000 mmol | Freq: Once | INTRAVENOUS | Status: AC
  Administered 2024-04-25: 30 mmol via INTRAVENOUS
  Filled 2024-04-25: qty 10

## 2024-04-25 NOTE — Discharge Summary (Signed)
 " Physician Discharge Summary   Patient: Vanessa Richards MRN: [id number] DOB: 07-19-1960  Admit date:     04/24/2024  Discharge date: 04/25/2024  Discharge Physician: Amaryllis Dare   PCP: Vanessa Verneita CROME, MD   Recommendations at discharge:  Please obtained renal function, magnesium  levels on follow-up Follow-up with primary care provider within a week Please continue counseling for alcohol use  Discharge Diagnoses: Principal Problem:   Electrolyte disturbance Active Problems:   Hypokalemia   Hypomagnesemia   Hypocalcemia   Hypophosphatemia   Numbness   Hypertension   Alcohol use   HLD (hyperlipidemia)   Migraine syndrome   Leukocytosis   Rash in right hand   Depression with anxiety   Obesity (BMI 30-39.9)   OSA on CPAP   Paresthesias   Hospital Course: Partly taken from H&P.  Vanessa Richards is a 63 y.o. female with medical history significant of alcohol use, HTN, HLD, pre-DM, GERD, depression with anxiety, migraine, obesity, OSAS on CPAP, who presents with numbness and tingling.  Patient had some headache yesterday which has been resolved.  Having some intermittent numbness of nose, both hands and both knees.  No focal weakness or facial drop.  She also has some erythematous rash in her abdomen yesterday which has been resolved.  On presentation elevated blood pressure at 188/127, labs with electrolyte abnormalities which include hypokalemia with potassium at 2.8, magnesium  1.1, calcium  8.0, phosphorus 1.9, WBC 10.7, alcohol level is less than 15.  CT head negative for any acute abnormality. EKG with NSR, QTc 448, early R wave progression, PVCs.  Patient was admitted for electrolyte disturbances and replacement.  12/23: Hemodynamically stable, hypokalemia resolved with potassium at 3.8, hypomagnesemia resolved.  Phosphorus is being repleted.  Her numbness and paresthesias completely resolved.  Patient did had mild rash on right hand which developed in the ambulance.   Seems like mild contact dermatitis.  Patient was advised to use some Aquaphor and a thin layer of steroid as needed.  She can follow-up with her primary care provider for further assistance.  She was also advised to use rehydration fluid instead of whole lot of free water.  She should also avoid excessive alcohol use.  She will continue with her home medications and need to have a close follow-up with her providers for further assistance.  Consultants: None Procedures performed: None Disposition: Home Diet recommendation:  Regular diet DISCHARGE MEDICATION: Allergies as of 04/25/2024       Reactions   Tricor  [fenofibrate ] Other (See Comments)   Intolerance        Medication List     STOP taking these medications    doxycycline  100 MG tablet Commonly known as: VIBRA -TABS       TAKE these medications    ALPRAZolam  0.5 MG tablet Commonly known as: XANAX  TAKE ONE TABLET BY MOUTH AT BEDTIME AS NEEDED FOR ANXIETY   atorvastatin  20 MG tablet Commonly known as: LIPITOR TAKE 1 TABLET(20 MG) BY MOUTH DAILY   betamethasone  dipropionate 0.05 % cream Apply topically 2 (two) times daily. Until rash has resolved   escitalopram  10 MG tablet Commonly known as: LEXAPRO  Take 1 tablet (10 mg total) by mouth daily.   fluticasone  50 MCG/ACT nasal spray Commonly known as: FLONASE  Place 2 sprays into both nostrils daily.   folic acid  1 MG tablet Commonly known as: FOLVITE  Take 1 tablet (1 mg total) by mouth daily. Start taking on: April 26, 2024   loratadine  10 MG tablet Commonly known as: CLARITIN   TAKE ONE TABLET BY MOUTH ONCE DAILY   meloxicam  15 MG tablet Commonly known as: MOBIC  TAKE 1 TABLET(15 MG) BY MOUTH DAILY   metoprolol  succinate 100 MG 24 hr tablet Commonly known as: TOPROL -XL TAKE 1 TABLET BY MOUTH WITH OR IMMEDIATELY FOLLOWING A MEAL   multivitamin with minerals Tabs tablet Take 1 tablet by mouth daily. Start taking on: April 26, 2024   omeprazole   20 MG capsule Commonly known as: PRILOSEC TAKE 1 CAPSULE BY MOUTH EVERY DAY   SUMAtriptan  25 MG tablet Commonly known as: IMITREX  Take 1 tablet at the onset of headache) every 2 hours as needed  not to exceed 8 in 24 hours   thiamine  100 MG tablet Commonly known as: Vitamin B-1 Take 1 tablet (100 mg total) by mouth daily. Start taking on: April 26, 2024   traZODone  50 MG tablet Commonly known as: DESYREL  Take 0.5-1 tablets (25-50 mg total) by mouth at bedtime as needed for sleep.   triamterene -hydrochlorothiazide  37.5-25 MG capsule Commonly known as: DYAZIDE Take 1 each (1 capsule total) by mouth every morning.        Follow-up Information     Vanessa Verneita CROME, MD. Schedule an appointment as soon as possible for a visit in 1 week(s).   Specialty: Internal Medicine Contact information: [address] [place] [address] [phone]                Discharge Exam: Vanessa Richards   04/24/24 1716  Weight: 78.9 kg   General.  Obese lady, in no acute distress. Pulmonary.  Lungs clear bilaterally, normal respiratory effort. CV.  Regular rate and rhythm, no JVD, rub or murmur. Abdomen.  Soft, nontender, nondistended, BS positive. CNS.  Alert and oriented .  No focal neurologic deficit. Extremities.  No edema, pulses intact and symmetrical.  Mild erythema involving right dorsal hand. Psychiatry.  Judgment and insight appears normal.   Condition at discharge: stable  The results of significant diagnostics from this hospitalization (including imaging, microbiology, ancillary and laboratory) are listed below for reference.   Imaging Studies: CT Head Wo Contrast Result Date: 04/24/2024 CLINICAL DATA:  Facial numbness. EXAM: CT HEAD WITHOUT CONTRAST TECHNIQUE: Contiguous axial images were obtained from the base of the skull through the vertex without intravenous contrast. RADIATION DOSE REDUCTION: This exam was performed according to the departmental  dose-optimization program which includes automated exposure control, adjustment of the mA and/or kV according to patient size and/or use of iterative reconstruction technique. COMPARISON:  None Available. FINDINGS: Brain: No evidence of acute infarction, hemorrhage, hydrocephalus, extra-axial collection or mass lesion/mass effect. Vascular: No hyperdense vessel or unexpected calcification. Skull: Normal. Negative for fracture or focal lesion. Sinuses/Orbits: No acute finding. Other: None. IMPRESSION: No acute intracranial pathology. Electronically Signed   By: Suzen Dials M.D.   On: 04/24/2024 19:00    Microbiology: Results for orders placed or performed in visit on 03/22/15  Urine culture     Status: None   Collection Time: 03/22/15  2:46 PM   Specimen: Urine  Result Value Ref Range Status   Culture ESCHERICHIA COLI  Final   Colony Count >=100,000 COLONIES/ML  Final   Organism ID, Bacteria ESCHERICHIA COLI  Final      Susceptibility   Escherichia coli -  (no method available)    CEFAZOLIN 26 Sensitive     AMPICILLIN 4 Sensitive     AMOX/CLAVULANIC <=2 Sensitive     AMPICILLIN/SULBACTAM <=2 Sensitive     PIP/TAZO <=4  Sensitive     IMIPENEM <=0.25 Sensitive     CEFTRIAXONE <=1 Sensitive     CEFTAZIDIME <=1 Sensitive     CEFEPIME <=1 Sensitive     GENTAMICIN <=1 Sensitive     TOBRAMYCIN <=1 Sensitive     CIPROFLOXACIN 0.5 Sensitive     LEVOFLOXACIN 1 Sensitive     NITROFURANTOIN <=16 Sensitive     TRIMETH/SULFA* <=20 Sensitive      * NR=NOT REPORTABLE,SEE COMMENTORAL therapy:A cefazolin MIC of <32 predicts susceptibility to the oral agents cefaclor,cefdinir,cefpodoxime,cefprozil,cefuroxime,cephalexin ,and loracarbef when used for therapy of uncomplicated UTIs due to E.coli,K.pneumomiae,and P.mirabilis. PARENTERAL therapy: A cefazolinMIC of >8 indicates resistance to parenteralcefazolin. An alternate test method must beperformed to confirm susceptibility to parenteralcefazolin.     Labs: CBC: Recent Labs  Lab 04/24/24 1730 04/25/24 0325  WBC 10.7* 10.7*  NEUTROABS 6.4  --   HGB 12.3 12.6  HCT 36.2 37.7  MCV 87.2 88.7  PLT 299 288   Basic Metabolic Panel: Recent Labs  Lab 04/24/24 1730 04/25/24 0325  NA 140 140  K 2.8* 3.8  CL 101 105  CO2 24 25  GLUCOSE 167* 129*  BUN 11 8  CREATININE 0.74 0.66  CALCIUM  8.0* 8.6*  MG 1.1* 2.7*  PHOS 1.9*  --    Liver Function Tests: Recent Labs  Lab 04/24/24 1730  AST 25  ALT 17  ALKPHOS 101  BILITOT 0.3  PROT 7.1  ALBUMIN 4.2   CBG: No results for input(s): GLUCAP in the last 168 hours.  Discharge time spent: greater than 30 minutes.  This record has been created using Conservation officer, historic buildings. Errors have been sought and corrected,but may not always be located. Such creation errors do not reflect on the standard of care.   Signed: Amaryllis Dare, MD [facility] 04/25/2024 "

## 2024-04-25 NOTE — Consult Note (Addendum)
 PHARMACY CONSULT NOTE - FOLLOW UP  Pharmacy Consult for Electrolyte Monitoring and Replacement   Recent Labs: Potassium (mmol/L)  Date Value  04/25/2024 3.8   Magnesium  (mg/dL)  Date Value  87/76/7974 2.7 (H)   Calcium  (mg/dL)  Date Value  87/76/7974 8.6 (L)   Albumin (g/dL)  Date Value  87/77/7974 4.2   Phosphorus (mg/dL)  Date Value  87/77/7974 1.9 (L)   Sodium (mmol/L)  Date Value  04/25/2024 140  04/06/2023 146 (H)    Assessment: 63 y/o female with  PMH of HTN, migraines presenting to the ED with electrolyte disturbance, numbness in her nose, hands, and knees. Pharmacy was consulted to follow and replace electrolytes.   Goal of Therapy:  Electrolytes WNL  Plan:  Potassium phosphate  30 mmol IV x 1 ordered and in progress Will follow electrolytes with AM labs tomorrow    Lum VEAR Mania ,PharmD Clinical Pharmacist 04/25/2024 8:22 AM

## 2024-04-25 NOTE — TOC Initial Note (Signed)
 Transition of Care [facility]) - Initial/Assessment Note    Patient Details  Name: Vanessa Richards MRN: [id number] Date of Birth: 1960-06-15  [facility]) CM/SW Contact:    Vanessa Bruso L Jeronda Don, LCSW Phone Number: 04/25/2024, 7:50 AM  Clinical Narrative:                  [facility] consult received for substance abuse education/counseling. TOC does not provide education/counseling. Resources have been added to the AVS.        Patient Goals and CMS Choice            Expected Discharge Plan and Services                                              Prior Living Arrangements/Services                       Activities of Daily Living      Permission Sought/Granted                  Emotional Assessment              Admission diagnosis:  Electrolyte disturbance [E87.8] Patient Active Problem List   Diagnosis Date Noted   OSA on CPAP 04/25/2024   Hypophosphatemia 04/25/2024   Electrolyte disturbance 04/24/2024   HLD (hyperlipidemia) 04/24/2024   Depression with anxiety 04/24/2024   Numbness 04/24/2024   Hypokalemia 04/24/2024   Hypomagnesemia 04/24/2024   Hypocalcemia 04/24/2024   Leukocytosis 04/24/2024   Alcohol use 04/24/2024   Rash in right hand 04/24/2024   OSA (obstructive sleep apnea) 03/31/2023   Snoring 03/15/2023   Palpitations 03/03/2023   Contact dermatitis and eczema 11/09/2021   Chronic gastritis 05/29/2021   Scalp psoriasis 05/17/2021   Thyroid  enlargement 05/17/2021   Educated about COVID-19 virus infection 08/23/2018   Allergic rhinitis due to pollen 08/23/2018   Prediabetes 01/27/2018   Morton's metatarsalgia 12/01/2017   Generalized anxiety disorder 11/27/2016   Hypertriglyceridemia 11/11/2014   Encounter for general adult medical examination with abnormal findings 10/18/2012   Obesity (BMI 30-39.9) 11/05/2011   GERD (gastroesophageal reflux disease)    Hypertension    Migraine syndrome    PCP:  Marylynn Verneita CROME, MD Pharmacy:   [facility] [phone] [place], [place] - [address] AT [facility] OF [address] [place] [address] Phone: [phone] Fax: [phone]     Social Drivers of Health (SDOH) Social History: SDOH Screenings   Depression (PHQ2-9): Low Risk (12/08/2023)  Tobacco Use: Low Risk (04/24/2024)   SDOH Interventions:     Readmission Risk Interventions     No data to display

## 2024-04-25 NOTE — Discharge Instructions (Signed)

## 2024-04-25 NOTE — Hospital Course (Addendum)
 Partly taken from H&P.  Vanessa Richards is a 63 y.o. female with medical history significant of alcohol use, HTN, HLD, pre-DM, GERD, depression with anxiety, migraine, obesity, OSAS on CPAP, who presents with numbness and tingling.  Patient had some headache yesterday which has been resolved.  Having some intermittent numbness of nose, both hands and both knees.  No focal weakness or facial drop.  She also has some erythematous rash in her abdomen yesterday which has been resolved.  On presentation elevated blood pressure at 188/127, labs with electrolyte abnormalities which include hypokalemia with potassium at 2.8, magnesium  1.1, calcium  8.0, phosphorus 1.9, WBC 10.7, alcohol level is less than 15.  CT head negative for any acute abnormality. EKG with NSR, QTc 448, early R wave progression, PVCs.  Patient was admitted for electrolyte disturbances and replacement.  12/23: Hemodynamically stable, hypokalemia resolved with potassium at 3.8, hypomagnesemia resolved.  Phosphorus is being repleted.  Her numbness and paresthesias completely resolved.  Patient did had mild rash on right hand which developed in the ambulance.  Seems like mild contact dermatitis.  Patient was advised to use some Aquaphor and a thin layer of steroid as needed.  She can follow-up with her primary care provider for further assistance.  She was also advised to use rehydration fluid instead of whole lot of free water.  She should also avoid excessive alcohol use.  She will continue with her home medications and need to have a close follow-up with her providers for further assistance.

## 2024-05-01 ENCOUNTER — Other Ambulatory Visit: Payer: Self-pay | Admitting: Internal Medicine

## 2024-05-01 ENCOUNTER — Telehealth: Payer: Self-pay | Admitting: Internal Medicine

## 2024-05-01 DIAGNOSIS — R509 Fever, unspecified: Secondary | ICD-10-CM

## 2024-05-01 NOTE — Telephone Encounter (Signed)
 Copied from CRM #[id number]. Topic: Appointments - Scheduling Inquiry for Clinic >> May 01, 2024 11:40 AM Corin V wrote: Reason for CRM: Patient was discharged from hospital 12/23, no hospital follow ups available within 14 days. Please call patient to work in to schedule. She is out of town 05/05/24.  Patient reported that she is not having any new or worsening symptoms since her discharge, but she did find a tick under her armpit after being discharged and she is worried this may be the cause of her symptoms. She stated she took the doxycycline  prescribed and it has cleared up the spots and redness on her hand and wrist as well as the headaches. The only remaining symptoms are some dizziness and weakness.

## 2024-05-01 NOTE — Telephone Encounter (Signed)
 Spoke with pt and scheduled her for a hospital follow up on 05/08/2024. Pt would like to know if she needs any blood work done before her appt. She stated that she would like to be tested for [facility] fever.

## 2024-05-02 ENCOUNTER — Other Ambulatory Visit (INDEPENDENT_AMBULATORY_CARE_PROVIDER_SITE_OTHER)

## 2024-05-02 DIAGNOSIS — R509 Fever, unspecified: Secondary | ICD-10-CM

## 2024-05-02 LAB — COMPREHENSIVE METABOLIC PANEL WITH GFR
ALT: 22 U/L (ref 3–35)
AST: 19 U/L (ref 5–37)
Albumin: 4.6 g/dL (ref 3.5–5.2)
Alkaline Phosphatase: 100 U/L (ref 39–117)
BUN: 16 mg/dL (ref 6–23)
CO2: 32 meq/L (ref 19–32)
Calcium: 9.9 mg/dL (ref 8.4–10.5)
Chloride: 97 meq/L (ref 96–112)
Creatinine, Ser: 0.82 mg/dL (ref 0.40–1.20)
GFR: 76.25 mL/min
Glucose, Bld: 87 mg/dL (ref 70–99)
Potassium: 4 meq/L (ref 3.5–5.1)
Sodium: 138 meq/L (ref 135–145)
Total Bilirubin: 0.5 mg/dL (ref 0.2–1.2)
Total Protein: 7.9 g/dL (ref 6.0–8.3)

## 2024-05-02 LAB — MAGNESIUM: Magnesium: 2.3 mg/dL (ref 1.5–2.5)

## 2024-05-02 NOTE — Telephone Encounter (Signed)
 LMTCB. Please schedule pt a lab appt when she returns the call to office. Labs have been ordered.

## 2024-05-05 ENCOUNTER — Ambulatory Visit: Payer: Self-pay | Admitting: Internal Medicine

## 2024-05-05 LAB — SPOTTED FEVER GROUP ANTIBODIES
Spotted Fever Group IgG: <1:64 {titer}
Spotted Fever Group IgM: <1:64 {titer}

## 2024-05-08 ENCOUNTER — Ambulatory Visit: Admitting: Internal Medicine

## 2024-05-08 ENCOUNTER — Encounter: Payer: Self-pay | Admitting: Internal Medicine

## 2024-05-08 DIAGNOSIS — G4733 Obstructive sleep apnea (adult) (pediatric): Secondary | ICD-10-CM | POA: Diagnosis not present

## 2024-05-08 DIAGNOSIS — F10982 Alcohol use, unspecified with alcohol-induced sleep disorder: Secondary | ICD-10-CM | POA: Diagnosis not present

## 2024-05-08 DIAGNOSIS — R202 Paresthesia of skin: Secondary | ICD-10-CM

## 2024-05-08 DIAGNOSIS — R030 Elevated blood-pressure reading, without diagnosis of hypertension: Secondary | ICD-10-CM

## 2024-05-08 DIAGNOSIS — E878 Other disorders of electrolyte and fluid balance, not elsewhere classified: Secondary | ICD-10-CM | POA: Diagnosis not present

## 2024-05-08 DIAGNOSIS — R531 Weakness: Secondary | ICD-10-CM

## 2024-05-08 DIAGNOSIS — I10 Essential (primary) hypertension: Secondary | ICD-10-CM | POA: Diagnosis not present

## 2024-05-08 DIAGNOSIS — G4700 Insomnia, unspecified: Secondary | ICD-10-CM | POA: Insufficient documentation

## 2024-05-08 LAB — BASIC METABOLIC PANEL WITH GFR
BUN: 13 mg/dL (ref 6–23)
CO2: 26 meq/L (ref 19–32)
Calcium: 9.3 mg/dL (ref 8.4–10.5)
Chloride: 101 meq/L (ref 96–112)
Creatinine, Ser: 0.81 mg/dL (ref 0.40–1.20)
GFR: 77.37 mL/min
Glucose, Bld: 84 mg/dL (ref 70–99)
Potassium: 3.7 meq/L (ref 3.5–5.1)
Sodium: 136 meq/L (ref 135–145)

## 2024-05-08 LAB — MICROALBUMIN / CREATININE URINE RATIO
Creatinine,U: 19.2 mg/dL
Microalb Creat Ratio: UNDETERMINED mg/g (ref 0.0–30.0)
Microalb, Ur: <0.7 mg/dL

## 2024-05-08 LAB — VITAMIN D 25 HYDROXY (VIT D DEFICIENCY, FRACTURES): VITD: 30.7 ng/mL (ref 30.00–100.00)

## 2024-05-08 LAB — MAGNESIUM: Magnesium: 2.1 mg/dL (ref 1.5–2.5)

## 2024-05-08 MED ORDER — TRAZODONE HCL 50 MG PO TABS: 25.0000 mg | ORAL_TABLET | Freq: Every evening | ORAL | 3 refills | Status: DC | PRN

## 2024-05-08 MED ORDER — ESCITALOPRAM OXALATE 10 MG PO TABS: 10.0000 mg | ORAL_TABLET | Freq: Every day | ORAL | 1 refills | Status: DC

## 2024-05-08 MED ORDER — OMEPRAZOLE 20 MG PO CPDR: DELAYED_RELEASE_CAPSULE | ORAL | 1 refills | Status: AC

## 2024-05-08 NOTE — Assessment & Plan Note (Signed)
" ° °  Lab Results  Component Value Date   WBC 10.7 (H) 04/25/2024   HGB 12.6 04/25/2024   HCT 37.7 04/25/2024   MCV 88.7 04/25/2024   PLT 288 04/25/2024    "

## 2024-05-08 NOTE — Assessment & Plan Note (Addendum)
 Etiology unclear may have been due to alcohol  abuse  hs not taken maxzide in nearly a month. Repeat levels were normal,    Last metabolic panel Lab Results  Component Value Date   GLUCOSE 84 05/08/2024   NA 136 05/08/2024   K 3.7 05/08/2024   CL 101 05/08/2024   CO2 26 05/08/2024   BUN 13 05/08/2024   CREATININE 0.81 05/08/2024   GFR 77.37 05/08/2024   CALCIUM  9.3 05/08/2024   PHOS 1.9 (L) 04/24/2024   PROT 7.9 05/02/2024   ALBUMIN 4.6 05/02/2024   BILITOT 0.5 05/02/2024   ALKPHOS 100 05/02/2024   AST 19 05/02/2024   ALT 22 05/02/2024   ANIONGAP 11 04/25/2024

## 2024-05-08 NOTE — Assessment & Plan Note (Signed)
 Transient.  In the setting of hypokalemia, hypomagnesemia, and hypocalcemia of unclear etiology , now resolved.

## 2024-05-08 NOTE — Assessment & Plan Note (Addendum)
 Managed with metoprolol  and maxzide . she reports non compliance with medication regimen  and  has an elevated reading today in office. Advised to resume maxzde and recheck lytes in one month    Lab Results  Component Value Date   CREATININE 0.81 05/08/2024   Lab Results  Component Value Date   MICROALBUR <0.7 05/08/2024   MICROALBUR 0.7 12/08/2023     Lab Results  Component Value Date   NA 136 05/08/2024   K 3.7 05/08/2024   CL 101 05/08/2024   CO2 26 05/08/2024

## 2024-05-08 NOTE — Telephone Encounter (Signed)
 Pt never called back to schedule the lab appt. Pt is scheduled to see Dr. Marylynn today.

## 2024-05-08 NOTE — Assessment & Plan Note (Signed)
 May have been due to maxzide vs alcohol abuse .  Repeat level is normal

## 2024-05-08 NOTE — Patient Instructions (Addendum)
 Your neurologic exam is now normal .  I am rechecking them today   Your blood pressure is still high.  Please check your blood pressure  5 times over the next 2 weeks at home and send me the readings  in one month so I can determine if you need to start an additional  medication to lower your blood pressure .     You might want to try using Relaxium for insomnia  (as seen on TV commercials) . It is available through [employer] and contains all natural supplements:  Melatonin 5 mg  Chamomile 25 mg Passionflower extract 75 mg GABA 100 mg Ashwaganda extract 125 mg Magnesium  citrate, glycinate, oxide (100 mg)  L tryptophan 500 mg Valerest (proprietary  ingredient ; probably valeria root extract)

## 2024-05-08 NOTE — Progress Notes (Signed)
 "  Subjective:  Patient ID: Vanessa Richards, female    DOB: 11-Oct-1960  Age: 64 y.o. MRN: [id number]  CC: The primary encounter diagnosis was Hypocalcemia. Diagnoses of Hypomagnesemia, Elevated blood pressure reading in office without diagnosis of hypertension, Paresthesias with subjective weakness, OSA (obstructive sleep apnea), OSA on CPAP, Electrolyte disturbance, Primary hypertension, and Alcohol-induced insomnia (HCC) were also pertinent to this visit.   HPI CYNARA TATHAM presents for  Chief Complaint  Patient presents with   Hospitalization Follow-up   Evaluated in ER on Dec 22   for sudden onset of parasthesias of nose , hands. That progressed to bilateral hand weakness noted while driving, along with dysarthria. The preceding day had taken tylenol for a bad headache that did not resolve.  CT head normal , but  electrolytes were all LOW:  magnesium ,  potassium and calcium  were low. No dietary changes except discontinuation of a probiotic/power green/ashwaganda product .  Alcohol intake reviewed: had been drinking 3 beers per night ; told she was  abusing alcohol and needed detox .  Was given thiamine , folate electrolyte infusion.  Has reduced alcohol use since ER visit,  has only had 2 beers since Dec 22 Has been taking 5000 Ius of D3 daily since Dec 22  and an electrolyte replacement drink.          All symptoms resolved on Friday Dec 26  midnight after several days of doxycycline  and after several episodes of vomiting .    Has had meat since then (steak on Dec 31)  no nausea or vomiting   She has a history of cervical disk disease (?)  which was treated until August by chiropractic manipulation twice weekly for 3 months which improved her low back pain .  Denies any history of neck pain   Joint stiffness : right hip,    Wants to stop using trazodone  becuas eof crazy dreams  . Has stopped lexapro  as well.  Using CPAP every night    Outpatient Medications Prior to Visit   Medication Sig Dispense Refill   ALPRAZolam  (XANAX ) 0.5 MG tablet TAKE ONE TABLET BY MOUTH AT BEDTIME AS NEEDED FOR ANXIETY 30 tablet 5   atorvastatin  (LIPITOR) 20 MG tablet TAKE 1 TABLET(20 MG) BY MOUTH DAILY 90 tablet 0   betamethasone  dipropionate 0.05 % cream Apply topically 2 (two) times daily. Until rash has resolved 45 g 2   fluticasone  (FLONASE ) 50 MCG/ACT nasal spray Place 2 sprays into both nostrils daily. 16 g 5   folic acid  (FOLVITE ) 1 MG tablet Take 1 tablet (1 mg total) by mouth daily. 90 tablet 0   loratadine  (CLARITIN ) 10 MG tablet TAKE ONE TABLET BY MOUTH ONCE DAILY 30 tablet 1   meloxicam  (MOBIC ) 15 MG tablet TAKE 1 TABLET(15 MG) BY MOUTH DAILY 30 tablet 3   metoprolol  succinate (TOPROL -XL) 100 MG 24 hr tablet TAKE 1 TABLET BY MOUTH WITH OR IMMEDIATELY FOLLOWING A MEAL 90 tablet 3   Multiple Vitamin (MULTIVITAMIN WITH MINERALS) TABS tablet Take 1 tablet by mouth daily. 90 tablet 1   SUMAtriptan  (IMITREX ) 25 MG tablet Take 1 tablet at the onset of headache) every 2 hours as needed  not to exceed 8 in 24 hours 9 tablet 0   thiamine  (VITAMIN B-1) 100 MG tablet Take 1 tablet (100 mg total) by mouth daily. 90 tablet 1   triamterene -hydrochlorothiazide  (DYAZIDE) 37.5-25 MG capsule Take 1 each (1 capsule total) by mouth every morning. 90 capsule 3  escitalopram  (LEXAPRO ) 10 MG tablet Take 1 tablet (10 mg total) by mouth daily. 90 tablet 1   omeprazole  (PRILOSEC) 20 MG capsule TAKE 1 CAPSULE BY MOUTH EVERY DAY 90 capsule 1   traZODone  (DESYREL ) 50 MG tablet Take 0.5-1 tablets (25-50 mg total) by mouth at bedtime as needed for sleep. 90 tablet 3   No facility-administered medications prior to visit.    Review of Systems;  Patient denies headache, fevers, malaise, unintentional weight loss, skin rash, eye pain, sinus congestion and sinus pain, sore throat, dysphagia,  hemoptysis , cough, dyspnea, wheezing, chest pain, palpitations, orthopnea, edema, abdominal pain, nausea, melena,  diarrhea, constipation, flank pain, dysuria, hematuria, urinary  Frequency, nocturia, numbness, tingling, seizures,  Focal weakness, Loss of consciousness,  Tremor, insomnia, depression, anxiety, and suicidal ideation.      Objective:  BP (!) 140/86   Pulse 82   Ht 5' 3 (1.6 m)   Wt 181 lb 3.2 oz (82.2 kg)   SpO2 95%   BMI 32.10 kg/m   BP Readings from Last 3 Encounters:  05/08/24 (!) 140/86  04/25/24 (!) 184/90  01/25/24 (!) 150/98    Wt Readings from Last 3 Encounters:  05/08/24 181 lb 3.2 oz (82.2 kg)  04/24/24 174 lb (78.9 kg)  01/25/24 181 lb (82.1 kg)    Physical Exam Vitals reviewed.  Constitutional:      General: She is not in acute distress.    Appearance: Normal appearance. She is normal weight. She is not ill-appearing, toxic-appearing or diaphoretic.  HENT:     Head: Normocephalic.  Eyes:     General: No scleral icterus.       Right eye: No discharge.        Left eye: No discharge.     Conjunctiva/sclera: Conjunctivae normal.  Cardiovascular:     Rate and Rhythm: Normal rate and regular rhythm.     Heart sounds: Normal heart sounds.  Pulmonary:     Effort: Pulmonary effort is normal. No respiratory distress.     Breath sounds: Normal breath sounds.  Musculoskeletal:        General: Normal range of motion.  Skin:    General: Skin is warm and dry.  Neurological:     General: No focal deficit present.     Mental Status: She is alert and oriented to person, place, and time. Mental status is at baseline.  Psychiatric:        Mood and Affect: Mood normal.        Behavior: Behavior normal.        Thought Content: Thought content normal.        Judgment: Judgment normal.     Lab Results  Component Value Date   HGBA1C 5.9 12/08/2023   HGBA1C 5.7 03/03/2023   HGBA1C 5.7 08/26/2022    Lab Results  Component Value Date   CREATININE 0.81 05/08/2024   CREATININE 0.82 05/02/2024   CREATININE 0.66 04/25/2024    Lab Results  Component Value Date    WBC 10.7 (H) 04/25/2024   HGB 12.6 04/25/2024   HCT 37.7 04/25/2024   PLT 288 04/25/2024   GLUCOSE 84 05/08/2024   CHOL 154 12/08/2023   TRIG (H) 12/08/2023    456.0 Triglyceride is over 400; calculations on Lipids are invalid.   HDL 44.20 12/08/2023   LDLDIRECT 67.0 12/08/2023   LDLCALC 119 (H) 02/17/2022   ALT 22 05/02/2024   AST 19 05/02/2024   NA 136 05/08/2024   K 3.7  05/08/2024   CL 101 05/08/2024   CREATININE 0.81 05/08/2024   BUN 13 05/08/2024   CO2 26 05/08/2024   TSH 1.68 12/08/2023   HGBA1C 5.9 12/08/2023   MICROALBUR <0.7 05/08/2024    CT Head Wo Contrast Result Date: 04/24/2024 CLINICAL DATA:  Facial numbness. EXAM: CT HEAD WITHOUT CONTRAST TECHNIQUE: Contiguous axial images were obtained from the base of the skull through the vertex without intravenous contrast. RADIATION DOSE REDUCTION: This exam was performed according to the departmental dose-optimization program which includes automated exposure control, adjustment of the mA and/or kV according to patient size and/or use of iterative reconstruction technique. COMPARISON:  None Available. FINDINGS: Brain: No evidence of acute infarction, hemorrhage, hydrocephalus, extra-axial collection or mass lesion/mass effect. Vascular: No hyperdense vessel or unexpected calcification. Skull: Normal. Negative for fracture or focal lesion. Sinuses/Orbits: No acute finding. Other: None. IMPRESSION: No acute intracranial pathology. Electronically Signed   By: Suzen Dials M.D.   On: 04/24/2024 19:00    Assessment & Plan:  .Hypocalcemia -     Calcium , ionized -     PTH, intact and calcium  -     VITAMIN D  25 Hydroxy (Vit-D Deficiency, Fractures)  Hypomagnesemia Assessment & Plan: May have been due to maxzide vs alcohol abuse .  Repeat level is normal   Orders: -     Magnesium   Elevated blood pressure reading in office without diagnosis of hypertension -     Basic metabolic panel with GFR -     Microalbumin / creatinine  urine ratio  Paresthesias with subjective weakness Assessment & Plan: Transient.  In the setting of hypokalemia, hypomagnesemia, and hypocalcemia of unclear etiology , now resolved.    OSA (obstructive sleep apnea)  OSA on CPAP Assessment & Plan: Diagnosed  by a one night home sleep study Nov 2024  (study was cut short by influenza illness)  AHIi was 22.8  .  She is wearing autotitrating CPA P and notes improved sleepand daytime wakefulness    Electrolyte disturbance Assessment & Plan: Etiology unclear may have been due to alcohol  abuse  hs not taken maxzide in nearly a month. Repeat levels were normal,    Last metabolic panel Lab Results  Component Value Date   GLUCOSE 84 05/08/2024   NA 136 05/08/2024   K 3.7 05/08/2024   CL 101 05/08/2024   CO2 26 05/08/2024   BUN 13 05/08/2024   CREATININE 0.81 05/08/2024   GFR 77.37 05/08/2024   CALCIUM  9.3 05/08/2024   PHOS 1.9 (L) 04/24/2024   PROT 7.9 05/02/2024   ALBUMIN 4.6 05/02/2024   BILITOT 0.5 05/02/2024   ALKPHOS 100 05/02/2024   AST 19 05/02/2024   ALT 22 05/02/2024   ANIONGAP 11 04/25/2024      Primary hypertension Assessment & Plan: Managed with metoprolol  and maxzide . she reports non compliance with medication regimen  and  has an elevated reading today in office. Advised to resume maxzde and recheck lytes in one month    Lab Results  Component Value Date   CREATININE 0.81 05/08/2024   Lab Results  Component Value Date   MICROALBUR <0.7 05/08/2024   MICROALBUR 0.7 12/08/2023     Lab Results  Component Value Date   NA 136 05/08/2024   K 3.7 05/08/2024   CL 101 05/08/2024   CO2 26 05/08/2024      Alcohol-induced insomnia (HCC) Assessment & Plan: May be alcohol induced .  She prefers o stop trazodone  due to excessive dreams.  Recommended she try Relaxium    Other orders -     Omeprazole ; TAKE 1 CAPSULE BY MOUTH EVERY DAY  Dispense: 90 capsule; Refill: 1     I spent 34 minutes on the day  of this face to face encounter reviewing patient's  most recent ER  visit recent  labs and imaging studies, counseling on alcohol use and hypertension management,  reviewing the assessment and plan with patient, and post visit ordering and reviewing of  diagnostics and therapeutics with patient  .   Follow-up: Return in about 6 months (around 11/05/2024).   Verneita LITTIE Kettering, MD "

## 2024-05-08 NOTE — Assessment & Plan Note (Signed)
 Diagnosed  by a one night home sleep study Nov 2024  (study was cut short by influenza illness)  AHIi was 22.8  .  She is wearing autotitrating CPA P and notes improved sleepand daytime wakefulness

## 2024-05-08 NOTE — Assessment & Plan Note (Signed)
 May be alcohol induced .  She prefers o stop trazodone  due to excessive dreams.  Recommended she try Relaxium

## 2024-05-10 ENCOUNTER — Ambulatory Visit: Payer: Self-pay | Admitting: Internal Medicine

## 2024-05-11 LAB — PTH, INTACT AND CALCIUM: Calcium: 8.7 mg/dL (ref 8.6–10.4)

## 2024-05-11 LAB — CALCIUM, IONIZED: Calcium, Ion: 5 mg/dL (ref 4.7–5.5)

## 2024-05-15 ENCOUNTER — Ambulatory Visit: Attending: Cardiology | Admitting: Cardiology

## 2024-05-15 ENCOUNTER — Encounter: Payer: Self-pay | Admitting: Cardiology

## 2024-05-15 VITALS — BP 140/80 | HR 76 | Ht 63.0 in | Wt 182.2 lb

## 2024-05-15 DIAGNOSIS — I471 Supraventricular tachycardia, unspecified: Secondary | ICD-10-CM

## 2024-05-15 DIAGNOSIS — I10 Essential (primary) hypertension: Secondary | ICD-10-CM | POA: Diagnosis not present

## 2024-05-15 DIAGNOSIS — E782 Mixed hyperlipidemia: Secondary | ICD-10-CM

## 2024-05-15 DIAGNOSIS — R002 Palpitations: Secondary | ICD-10-CM

## 2024-05-15 NOTE — Patient Instructions (Signed)
 Medication Instructions:  Your physician recommends that you continue on your current medications as directed. Please refer to the Current Medication list given to you today.   *If you need a refill on your cardiac medications before your next appointment, please call your pharmacy*  Lab Work: No labs ordered today  If you have labs (blood work) drawn today and your tests are completely normal, you will receive your results only by: MyChart Message (if you have MyChart) OR A paper copy in the mail If you have any lab test that is abnormal or we need to change your treatment, we will call you to review the results.  Testing/Procedures: No test ordered today   Follow-Up: At [facility], you and your health needs are our priority.  As part of our continuing mission to provide you with exceptional heart care, our providers are all part of one team.  This team includes your primary Cardiologist (physician) and Advanced Practice Providers or APPs (Physician Assistants and Nurse Practitioners) who all work together to provide you with the care you need, when you need it.  Your next appointment:   6 month(s)  Provider:   You may see Redell Cave, MD or one of the following Advanced Practice Providers on your designated Care Team:   Tylene Lunch, NP

## 2024-05-15 NOTE — Progress Notes (Signed)
 " Cardiology Office Note   Date:  05/15/2024  ID:  SOLARIS KRAM, DOB 05-Sep-1960, MRN [id number] PCP: Marylynn Verneita CROME, MD  [facility] Providers Cardiologist:  Redell Cave, MD     History of Present Illness AVERI KILTY is a 64 y.o. female with past medical history of hypertension, hyperlipidemia, palpitations, atypical chest tightness, paroxysmal SVT, seen today for follow-up.   She was last seen in clinic 04/23/2023 by Dr. Cave is a new patient was referred for cardiac evaluation for hyperlipidemia.  She had had complaints of chest tightness ongoing over several years but endorses shortness of breath with worsening left-sided chest tightness on exertion.  Stated father had heart attack in his 68s and her father died in his 7s.  She was very worried due to family history of CAD she is not sure if her symptoms are related to palpitations are attributable to stress or recently diagnosed sleep apnea.  Prior testing revealed a cardiac monitor 03/26/2023 with 3 occasional paroxysmal SVT episodes of PVCs and 2.2% with no significant or sustained arrhythmias and no A-fib.  Outside stress testing completed in 2013 revealed a normal LVEF 55% and no evidence for ischemia.  She was scheduled for coronary CTA and an echocardiogram.  Previously seen in clinic 05/2022 states she been doing well from a cardiac perspective.  Coronary calcium  score of 0 with prior echocardiogram revealed an LVEF 55 to 60%, no RWMA, mild to moderate mitral valve regurgitation.  There were no medication changes that were made and no further testing that was ordered.  She was last seen in clinic 01/25/2024 doing well from a cardiac perspective.  The only complaint that she had at the time was occasional peripheral edema which been sitting for extended period of time send.  There were no medication changes that were made and no further testing that was ordered at that time.   She was hospitalized at [facility]  12/22 - 04/25/2024 for electrolyte derangement.  She had numbness and tingling and headache which started the day before.  She had numbness of the nose, both hands, and both knees.  Potassium of 2.8, magnesium , calcium  8.0, phosphorus.  Glucose 15.  CT negative for acute abnormality.  She was hemodynamically stable and electrolytes replaced.  She returns to clinic today stating overall for the cardiac perspective she has been doing well.  She had recent hospitalization due to electrolyte abnormalities.  She stated she had become stressed out and was doing a lot of running and was unsure what it happened.  She states she recently had labs done with her PCP and all of her electrolytes remain back and normalized.  She has not had any reoccurrence of numbness.  States that she has been compliant with her current medication regimen without any undue side effects.  Blood pressure slightly elevated today as unfortunately she did arrive at the wrong building prior to her appointment.  ROS: 10 point review of system has been reviewed and considered negative exception was listed in the HPI  Studies Reviewed      cCTA 04/26/2023 IMPRESSION: 1. Coronary calcium  score of 0.   2. Normal coronary origin with right dominance.   3. No evidence of CAD.   4. CAD-RADS 0. Consider non-atherosclerotic causes of chest pain.   2D echo 04/15/2023 1. Left ventricular ejection fraction, by estimation, is 55 to 60%. The  left ventricle has normal function. The left ventricle has no regional  wall motion abnormalities. Left ventricular diastolic  parameters were  normal. The average left ventricular  global longitudinal strain is -18.3 %.   2. Right ventricular systolic function is normal. The right ventricular  size is normal. Tricuspid regurgitation signal is inadequate for assessing  PA pressure.   3. The mitral valve is normal in structure. Mild to moderate mitral valve  regurgitation. No evidence of mitral  stenosis.   4. The aortic valve is tricuspid. Aortic valve regurgitation is not  visualized. No aortic stenosis is present.   5. The inferior vena cava is normal in size with greater than 50%  respiratory variability, suggesting right atrial pressure of 3 mmHg.  Risk Assessment/Calculations   HYPERTENSION CONTROL Vitals:   05/15/24 0906 05/15/24 0915  BP: (!) 150/82 (!) 140/80    The patient's blood pressure is elevated above target today.  In order to address the patient's elevated BP:           Physical Exam VS:  BP (!) 140/80 (BP Location: Left Arm, Patient Position: Sitting, Cuff Size: Normal)   Pulse 76   Ht 5' 3 (1.6 m)   Wt 182 lb 3.2 oz (82.6 kg)   SpO2 99%   BMI 32.28 kg/m        Wt Readings from Last 3 Encounters:  05/15/24 182 lb 3.2 oz (82.6 kg)  05/08/24 181 lb 3.2 oz (82.2 kg)  04/24/24 174 lb (78.9 kg)    GEN: Well nourished, well developed in no acute distress NECK: No JVD; No carotid bruits CARDIAC: RRR, no murmurs, rubs, gallops RESPIRATORY:  Clear to auscultation without rales, wheezing or rhonchi  ABDOMEN: Soft, non-tender, non-distended EXTREMITIES:  No edema; No deformity   ASSESSMENT AND PLAN Primary hypertension with a blood pressure today 140/80 on recheck.  She states that she has been compliant with her medication has been continued on Toprol -XL 100 mg daily and triamterene -HCTZ 37.5/25 mg.  She has been encouraged to continue to monitor pressures 1 to 2 hours postmedication administration.  Mixed hyperlipidemia with a direct LDL 67.  She is continued on atorvastatin  20 mg daily.  Ongoing management per PCP.  Palpitations/paroxysmal SVT previous cardiac monitor revealed paroxysmal SVT with no significant sustained arrhythmia.  She is continued on Toprol -XL 100 mg daily.  Electrolyte derangement that led to recent hospitalization.  Labs have normalized.  If she continues to have hyponatremia will consider discontinuing HCTZ.       Dispo:  Patient return to clinic to see me/APP in 6 months or sooner if needed for further evaluation.  Signed, Naiomi Musto, NP   "

## 2024-06-12 ENCOUNTER — Ambulatory Visit: Admitting: Internal Medicine
